# Patient Record
Sex: Female | Born: 1990 | Race: Black or African American | Hispanic: No | Marital: Single | Smoking: Never smoker
Health system: Southern US, Community
[De-identification: ages and names within clinical notes are randomized; demographics above are authoritative.]

## PROBLEM LIST (undated history)

## (undated) DIAGNOSIS — N289 Disorder of kidney and ureter, unspecified: Secondary | ICD-10-CM

## (undated) DIAGNOSIS — Z8659 Personal history of other mental and behavioral disorders: Secondary | ICD-10-CM

## (undated) DIAGNOSIS — F32A Depression, unspecified: Secondary | ICD-10-CM

## (undated) DIAGNOSIS — L309 Dermatitis, unspecified: Secondary | ICD-10-CM

## (undated) DIAGNOSIS — R519 Headache, unspecified: Secondary | ICD-10-CM

## (undated) DIAGNOSIS — E232 Diabetes insipidus: Secondary | ICD-10-CM

## (undated) DIAGNOSIS — I8390 Asymptomatic varicose veins of unspecified lower extremity: Secondary | ICD-10-CM

## (undated) DIAGNOSIS — E039 Hypothyroidism, unspecified: Secondary | ICD-10-CM

## (undated) DIAGNOSIS — J329 Chronic sinusitis, unspecified: Secondary | ICD-10-CM

## (undated) DIAGNOSIS — U071 COVID-19: Secondary | ICD-10-CM

## (undated) DIAGNOSIS — G4730 Sleep apnea, unspecified: Secondary | ICD-10-CM

## (undated) DIAGNOSIS — E739 Lactose intolerance, unspecified: Secondary | ICD-10-CM

## (undated) DIAGNOSIS — Z8639 Personal history of other endocrine, nutritional and metabolic disease: Secondary | ICD-10-CM

## (undated) HISTORY — DX: Asymptomatic varicose veins of unspecified lower extremity: I83.90

## (undated) HISTORY — DX: Personal history of other endocrine, nutritional and metabolic disease: Z86.39

## (undated) HISTORY — DX: Hypothyroidism, unspecified: E03.9

## (undated) HISTORY — DX: Lactose intolerance, unspecified: E73.9

## (undated) HISTORY — DX: Personal history of other mental and behavioral disorders: Z86.59

## (undated) HISTORY — DX: Disorder of kidney and ureter, unspecified: N28.9

## (undated) HISTORY — PX: OTHER SURGICAL HISTORY: SHX169

## (undated) HISTORY — PX: TYMPANOSTOMY TUBE PLACEMENT: SHX32

## (undated) HISTORY — DX: Depression, unspecified: F32.A

---
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
## 2009-05-05 LAB — CONVERTED CEMR LAB: Pap Smear: NORMAL

## 2009-08-05 ENCOUNTER — Ambulatory Visit: Payer: Self-pay | Admitting: Internal Medicine

## 2009-08-05 DIAGNOSIS — D179 Benign lipomatous neoplasm, unspecified: Secondary | ICD-10-CM | POA: Insufficient documentation

## 2009-08-05 DIAGNOSIS — E669 Obesity, unspecified: Secondary | ICD-10-CM | POA: Insufficient documentation

## 2009-08-07 LAB — CONVERTED CEMR LAB
Albumin: 3.6 g/dL (ref 3.5–5.2)
BUN: 11 mg/dL (ref 6–23)
CO2: 27 meq/L (ref 19–32)
Calcium: 10 mg/dL (ref 8.4–10.5)
Chloride: 111 meq/L (ref 96–112)
Eosinophils Absolute: 0.1 10*3/uL (ref 0.0–0.7)
Eosinophils Relative: 2.1 % (ref 0.0–5.0)
Glucose, Bld: 83 mg/dL (ref 70–99)
HDL: 43.1 mg/dL (ref >39.00)
LDL Cholesterol: 104 mg/dL — ABNORMAL HIGH (ref 0–99)
MCHC: 34 g/dL (ref 30.0–36.0)
MCV: 91.6 fL (ref 78.0–100.0)
Monocytes Absolute: 0.7 10*3/uL (ref 0.1–1.0)
Potassium: 4.6 meq/L (ref 3.5–5.1)
TSH: 2.07 microintl units/mL (ref 0.35–5.50)
Total CHOL/HDL Ratio: 4
Triglycerides: 65 mg/dL (ref 0.0–149.0)
VLDL: 13 mg/dL (ref 0.0–40.0)

## 2009-10-05 ENCOUNTER — Ambulatory Visit: Payer: Self-pay | Admitting: Internal Medicine

## 2010-03-22 ENCOUNTER — Ambulatory Visit
Admission: RE | Admit: 2010-03-22 | Discharge: 2010-03-22 | Payer: Self-pay | Source: Home / Self Care | Attending: Internal Medicine | Admitting: Internal Medicine

## 2010-03-22 DIAGNOSIS — K5289 Other specified noninfective gastroenteritis and colitis: Secondary | ICD-10-CM | POA: Insufficient documentation

## 2010-04-06 NOTE — Assessment & Plan Note (Signed)
Summary: not feeling well//ccm   Vital Signs:  Patient profile:   20 year old female Menstrual status:  regular LMP:     09/28/2009 Weight:      227 pounds O2 Sat:      98 % on Room air Temp:     98.2 degrees F oral Pulse rate:   66 / minute BP sitting:   110 / 60  (left arm) Cuff size:   large  Vitals Entered By: Romualdo Bolk, CMA (AAMA) (October 05, 2009 10:16 AM)  O2 Flow:  Room air CC: Coughing, throat feels swollen, sore throat, runny nose, eyes matted shut this started 2 weeks ago, fever 103 temp last week. Stuffy nose, congestion LMP (date): 09/28/2009 LMP - Character: normal Menarche (age onset years): 10-12   Menses interval (days): 28 Menstrual flow (days): 3-4 Enter LMP: 09/28/2009 Last PAP Result normal   History of Present Illness: Tiffany Roth comes in today  for sda  for above .   onset  2 weeks ago  with fever cough and st  fever  103 at onset.   no recent fever.   coughing is worse over past days .  and now coughing up brb and colored phelgm.   esp in am .  no wheezing or fever but feels tired and not well.   face pressure  also.     Using  otc robitussin  .    works in day care.    Preventive Screening-Counseling & Management  Alcohol-Tobacco     Alcohol drinks/day: 0     Smoking Status: never  Caffeine-Diet-Exercise     Caffeine use/day: 1-2     Does Patient Exercise: yes  Current Medications (verified): 1)  None  Allergies (verified): No Known Drug Allergies  Past History:  Past medical, surgical, family and social histories (including risk factors) reviewed, and no changes noted (except as noted below).  Past Medical History: Reviewed history from 08/05/2009 and no changes required. 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  Varicose veins  LE    neg obstruction 3 months premature     "6 month 1# 15 oz"  Ventilator  Hx bulimia in remission after  counseling  Past Surgical History: Reviewed history from 08/05/2009 and no changes required. Denies surgical history  Past History:  Care Management: Dermatology: [facility]cology: [facility]  Family History: Reviewed history from 08/05/2009 and no changes required. Father: Healthy  Mother: Healthy Siblings: 1/2 Sister- Healthy  neg for Dm   Social History: Reviewed history from 08/05/2009 and no changes required. 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    Review of Systems       The patient complains of anorexia and hoarseness.  The patient denies decreased hearing, chest pain, syncope, dyspnea on exertion, abdominal pain, unusual weight change, enlarged lymph nodes, and angioedema.         fever at onset  no  cehst pain and no nose bleeds  cough hard at times  to vomiting  Physical Exam  General:  well-developed, well-nourished, and well-hydrated.  congested and tired and hoarse   non toxic  Head:  Normocephalic and atraumatic without obvious abnormalities. No apparent alopecia or balding. Eyes:  clear  Ears:  R ear normal  and L ear normal.   Nose:  no external deformity and no external erythema.  copious mucoid discharge  face tendern masillary area  Mouth:  pharynx pink and moist.  mild erythema no edema Neck:  shoddy nodes  Lungs:  Normal respiratory effort, chest expands symmetrically. Lungs are clear to auscultation, no crackles or wheezes.no dullness.   Heart:  Normal rate and regular rhythm. S1 and S2 normal without gallop, murmur, click, rub or other extra sounds. Pulses:  nl cap refill  Skin:  turgor normal, color normal, no ecchymoses, and no petechiae.   Cervical Nodes:  shoddy nodes  Psych:  Oriented X3.  normal for situation   Impression & Recommendations:  Problem # 1:  SINUSITIS - ACUTE-NOS (ICD-461.9) maxillary  sinusitis  Her  updated medication list for this problem includes:    Hydromet 5-1.5 Mg/19ml Syrp (Hydrocodone-homatropine) .Marland Kitchen... 1-2 tsp q4-6 hours as needed cough  Problem # 2:  COUGH (ICD-786.2) infectious     Orders: T-2 View CXR (71020TC)  Problem # 3:  HEMOPTYSIS UNSPECIFIED (ICD-786.30) prob from infection     ... check chest x ray   Complete Medication List: 1)  Hydromet 5-1.5 Mg/12ml Syrp (Hydrocodone-homatropine) .Marland Kitchen.. 1-2 tsp q4-6 hours as needed cough 2)  Augmentin 875-125 Mg Tabs (Amoxicillin-pot clavulanate) .Marland Kitchen.. 1 by mouth two times a day  Patient Instructions: 1)  You have a sinus infection   2)  get a chest x ray today  to make sure you dont have pneumionia  and will   order an appropriate antibioitc   today 3)  expect improvement within 3-5 days on antibioitc  . 4)  call if not getting better . as such.  Prescriptions: HYDROMET 5-1.5 MG/5ML SYRP (HYDROCODONE-HOMATROPINE) 1-2 tsp q4-6 hours as needed cough  #6 oz x 0   Entered and Authorized by:   Madelin Headings MD   Signed by:   Madelin Headings MD on 10/05/2009   Method used:   Print then Give to Patient   RxID:   [id number]  note for work  out  until aug 3 or when better   wkp.

## 2010-04-06 NOTE — Assessment & Plan Note (Signed)
Summary: form completion.ok per doc/njr   Vital Signs:  Patient profile:   20 year old female Menstrual status:  regular LMP:     07/05/2009 Height:      61.5 inches Weight:      228 pounds BMI:     42.54 Pulse rate:   72 / minute BP sitting:   120 / 80  (right arm) Cuff size:   regular  Vitals Entered By: Romualdo Bolk, CMA (AAMA) (August 05, 2009 9:24 AM)  Nutrition Counseling: Patient's BMI is greater than 25 and therefore counseled on weight management options. CC: Form Completion LMP (date): 07/05/2009 LMP - Character: normal Menarche (age onset years): 10-12   Menses interval (days): 28 Menstrual flow (days): 3-4 Menstrual Status regular Enter LMP: 07/05/2009 Last PAP Result normal   History of Present Illness: Tiffany Roth comesin today for " form completion for job.  basically a check up .  There is no paper record available and  new patient forms reviewed.  Her last visit /check up here was  2008 3 years ago.     Marland Kitchen She was in school since  that time . She is to graduate  eventually in elem education. . She has been seen at [facility] for routine  problems had gyne check. 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   Already had PPD.  Ros neg ortho Cv pulm  . Varicose veins about the same. Wants to lose weight .  mood eats at times.    Preventive Care Screening  Pap Smear:    Date:  05/05/2009    Results:  normal   Prior Values:    Last Tetanus Booster:  Tdap [facility]) (10/03/2006)   Preventive Screening-Counseling & Management  Alcohol-Tobacco     Alcohol drinks/day: 0     Smoking Status: never  Caffeine-Diet-Exercise     Caffeine use/day: 1-2     Does Patient Exercise: yes      Drug Use:  no.    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   Smoking Status:  never Caffeine use/day:  1-2 Does Patient Exercise:  yes Drug Use:  no  Review of Systems  The patient denies anorexia, fever, weight loss, chest pain, syncope, dyspnea on exertion, prolonged cough, difficulty walking, depression, abnormal bleeding, enlarged lymph nodes, and angioedema.         neg vision hearing pulm cv problems ocass minor back pain and  .      contact and glasses.  Physical Exam General Appearance: well developed, well nourished, no acute distress Eyes: conjunctiva and lids normal, PERRLA, EOMI,  WNL Ears, Nose, Mouth, Throat: TM clear, nares clear, oral exam WNL Neck: supple, no lymphadenopathy, no thyromegaly, no JVD Respiratory: clear to auscultation and percussion, respiratory effort normal Cardiovascular: regular rate and rhythm, S1-S2, no murmur, rub or gallop, no bruits, peripheral pulses normal and symmetric, no cyanosis, clubbing, edema  multiple varicose veins le without ulceration  Chest: no scars, masses, tenderness; no asymmetry, skin changes, nipple discharge   Gastrointestinal: soft, non-tender; no hepatosplenomegaly, masses; active bowel sounds all quadrants,  small almond sized nodule left abdomen  no redness or fluctuance  Genitourinary:   done at [facility] Lymphatic: no cervical, axillary or inguinal adenopathy Musculoskeletal: gait normal,  muscle tone and strength WNL, no joint swelling, effusions, discoloration, crepitus  Skin: clear, good turgor, color WNL, no rashes, lesions, or ulcerations Neurologic: normal mental status, normal reflexes, normal strength, sensation, and motion Psychiatric: alert; oriented to person, place and time Other Exam:  paper record obtained and reviewed and loaded    Impression & Recommendations:  Problem # 1:  PREVENTIVE HEALTH CARE (ICD-V70.0)  Discussed nutrition,exercise,diet,healthy weight, vitamin D and calcium.   form signed       no limitations   had  neg ppd in past .  Orders: Venipuncture (16109) TLB-Lipid Panel (80061-LIPID) TLB-BMP (Basic Metabolic Panel-BMET) (80048-METABOL) TLB-CBC Platelet - w/Differential (85025-CBCD) TLB-Hepatic/Liver Function Pnl (80076-HEPATIC) TLB-TSH (Thyroid Stimulating Hormone) (84443-TSH) TLB-T4 (Thyrox), Free [phone])  Problem # 2:  HYPOTHYROIDISM (ICD-244.9)  hx of   as child  unsure when replacement stopped      .   Apparently doing ok  but should be rechecked .  Old  Paper record not available  today    Labs Reviewed: TSH: 1.75 (08/18/2006)    Chol: 197 (08/18/2006)   HDL: 37.4 (08/18/2006)     Problem # 3:  VARICOSE VEIN (ICD-456.8) bilateral    no change  or ulcerations  .   has been evaluated in remote past.    consider reeval if progressive   Problem # 4:  LIPOMA (ICD-214.9) check as needed getting larger  Problem # 5:  OBESITY (ICD-278.00) counseled   Patient Instructions: 1)  Get sleep   2)  sleep deprivation is related to weight gain and health issues. 3)  COnsider weight watcher  on line or other. 4)  do 2 weeks calendar of intake and sleep   to help change habits . 5)  no calories  in beverages . 6)  dont eat out for a month.

## 2010-04-08 NOTE — Assessment & Plan Note (Signed)
Summary: cramps//ccm   Vital Signs:  Patient profile:   20 year old female Menstrual status:  regular LMP:     02/28/2010 Weight:      228 pounds Temp:     97.9 degrees F oral Pulse rate:   78 / minute BP sitting:   110 / 80  (left arm) Cuff size:   large  Vitals Entered By: Romualdo Bolk, CMA (AAMA) (March 22, 2010 11:38 AM) CC: Started at 3am with severe pain in stomach. Pt is having vomiting and watery diarrhea. No traveling or ate anything abnormal. LMP (date): 02/28/2010 LMP - Character: normal Menarche (age onset years): 10-12   Menses interval (days): 28 Menstrual flow (days): 3-4 Enter LMP: 02/28/2010 Last PAP Result normal   History of Present Illness: Tiffany Roth comes in today  for acute onset of vomiting   watery diarrhea last pm with abd cramps and pain.   mostly on sides but upper middle and now on left.   recurrent frequent diarrhea  no blood and no fever.  No meds taken. last vomit early am.   No one else sick and no hx of same. no recent antibiotics cough could or uti signs .   Preventive Screening-Counseling & Management  Alcohol-Tobacco     Alcohol drinks/day: 0     Smoking Status: never  Caffeine-Diet-Exercise     Caffeine use/day: 1-2     Does Patient Exercise: yes  Current Medications (verified): 1)  Multivitamins   Tabs (Multiple Vitamin)  Allergies (verified): No Known Drug Allergies  Past History:  Past medical, surgical, family and social histories (including risk factors) reviewed, and no changes noted (except as noted below).  Past Medical History: Reviewed history from 08/05/2009 and no changes required. 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  Varicose veins  LE    neg obstruction 3 months premature     "6 month 1# 15 oz"  Ventilator  Hx bulimia in remission after counseling  Past Surgical History: Reviewed history from 08/05/2009 and  no changes required. Denies surgical history  Past History:  Care Management: Dermatology: [facility]cology: [facility]  Family History: Reviewed history from 08/05/2009 and no changes required. Father: Healthy  Mother: Healthy Siblings: 1/2 Sister- Healthy  neg for Dm   Social History: Reviewed history from 10/05/2009 and no changes required. 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    Review of Systems       The patient complains of anorexia.  The patient denies fever, weight loss, weight gain, vision loss, decreased hearing, chest pain, syncope, dyspnea on exertion, peripheral edema, prolonged cough, headaches, abdominal pain, melena, hematochezia, severe indigestion/heartburn, hematuria, incontinence, genital sores, abnormal bleeding, enlarged lymph nodes, and angioedema.    Physical Exam  General:  alert, well-developed, well-nourished, and well-hydrated.  mildy ill and washed out  nl cognition Head:  normocephalic and atraumatic.   Eyes:  clear Ears:  R ear normal and L ear normal.   Nose:  no external deformity.   Mouth:  pharynx pink and moist.  slightly dry lips Neck:  shoddy nodes  Lungs:  Normal respiratory effort, chest expands symmetrically. Lungs are clear to auscultation, no crackles or wheezes.no dullness.   Heart:  Normal rate and regular rhythm. S1 and S2 normal without gallop, murmur, click, rub  or other extra sounds. Abdomen:  soft, no distention, no masses, no guarding, no rigidity, no rebound tenderness, no hepatomegaly, and no splenomegaly.  slightly decrease bs  tender mid upper to luq area  no guarding  Pulses:  nl cap refill  Extremities:  varicosities minimal edema Neurologic:  grossly non focal  alert  Skin:  turgor normal, color normal, no ecchymoses, and no petechiae.   Cervical Nodes:  No lymphadenopathy noted Psych:  Oriented X3, normally interactive,  good eye contact, not anxious appearing, and not depressed appearing.     Impression & Recommendations:  Problem # 1:  GASTROENTERITIS, ACUTE (ICD-558.9) hydration seems adequate  Expectant management and  hydration   .   no school today but call if needs note for school. Spportive care  important.   Complete Medication List: 1)  Multivitamins Tabs (Multiple vitamin) 2)  Promethazine Hcl 25 Mg Supp (Promethazine hcl) .Marland Kitchen.. 1 po q4-6 hours  as needed  for nausea vomiting  Patient Instructions: 1)  clear liquid until vomiting gone such as gatorade and soup broth. 2)  then can advance to light diet avoiding fruits  and heavy foods.  3)  can try meds for nausea and vomiting . 4)  immodium could temporarily slow down diarrhea after the first day or so.  5)  call if fever severe pain dehydration   issues.  or if diarrhea not gone in 7-10days or so  Prescriptions: PROMETHAZINE HCL 25 MG SUPP (PROMETHAZINE HCL) 1 po q4-6 hours  as needed  for nausea vomiting  #12 x 0   Entered and Authorized by:   Madelin Headings MD   Signed by:   Madelin Headings MD on 03/22/2010   Method used:   Electronically to        [facility] [phone]* (retail)       [address]       [place], [place]  [address]       Ph: [phone] or [phone]       Fax: [phone]   RxID:   [id number]    Orders Added: 1)  Est. Patient Level IV [34742]   meant to send in  tabs  instead of suppositories ... spoke with mom  she is doing ok and hasnt needed to use med yet  .  tolerating gatorade . She took her back ot school [employer])  Madelin Headings MD  March 22, 2010 7:56 PM

## 2010-05-18 ENCOUNTER — Ambulatory Visit (INDEPENDENT_AMBULATORY_CARE_PROVIDER_SITE_OTHER): Payer: BC Managed Care – PPO | Admitting: Internal Medicine

## 2010-05-18 ENCOUNTER — Encounter: Payer: Self-pay | Admitting: Internal Medicine

## 2010-05-18 VITALS — BP 120/80 | HR 104 | Temp 97.9°F | Wt 225.0 lb

## 2010-05-18 DIAGNOSIS — J069 Acute upper respiratory infection, unspecified: Secondary | ICD-10-CM

## 2010-05-18 MED ORDER — METRONIDAZOLE 500 MG PO TABS: 500.0000 mg | ORAL_TABLET | Freq: Three times a day (TID) | ORAL | Status: AC

## 2010-05-18 NOTE — Patient Instructions (Signed)
This is a viral URI  With sinus involvement.  This should resolve in another 7-10 days . Take decongestant  Such as plain  sudafed or mucinex d and saline nose spray for  Moisturizing.     Nose spray     Cough may get worse before gets better.  If getting increasing sinus pain despite above or continuing without improvement . Call for advise . Sometime antibiotic treatment will help at that point.

## 2010-05-22 ENCOUNTER — Encounter: Payer: Self-pay | Admitting: Internal Medicine

## 2010-05-22 NOTE — Assessment & Plan Note (Signed)
No meds now 

## 2010-05-22 NOTE — Progress Notes (Signed)
  Subjective:    Patient ID: Tiffany Roth, female    DOB: 02-14-1991, 20 y.o.   MRN: [id number]  HPI Patient comes in today for acute visit. She had onset  about a week or so  of a cough and a runny nose. No specific treatment some fever at the onset but not currently she denies chest pain or shortness of breath however she is having continued symptoms and significant head clogging. Question if she has wheezing no history ofasthma   Review of Systems No chest pain shortness of breath ears are somewhat clogged nose rib pain nausea vomiting diarrhea or unusual rashes. 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    Objective:   Physical Exam Well-developed well-nourished and no acute distress but extremely congested. HEENT: Normocephalic ;atraumatic , Eyes;  PERRL, EOMs  Full, lids and conjunctiva clear,,Ears: no deformities, canals nl, TM landmarks normal, Nose: no deformity or Thick mucoid discharge face  nontender Mouth : OP clear without lesion or edema . Neck without masses or thyromegaly Chest:  Clear to A&P without wheezes rales or rhonchi CV:  S1-S2 no gallops or murmurs peripheral perfusion is normal No clubbing cyanosis      Assessment & Plan:  Acute upper respiratory infection probably viral   Expectant management.   And advice given for treatment as well as alarm features.  If not improving consider treatment and evaluation for sinusitis. 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

## 2010-07-16 ENCOUNTER — Ambulatory Visit (INDEPENDENT_AMBULATORY_CARE_PROVIDER_SITE_OTHER): Payer: BC Managed Care – PPO | Admitting: Internal Medicine

## 2010-07-16 ENCOUNTER — Encounter: Payer: Self-pay | Admitting: Internal Medicine

## 2010-07-16 VITALS — BP 120/80 | HR 66 | Temp 98.3°F | Wt 223.0 lb

## 2010-07-16 DIAGNOSIS — L259 Unspecified contact dermatitis, unspecified cause: Secondary | ICD-10-CM

## 2010-07-16 MED ORDER — CEPHALEXIN 500 MG PO CAPS: 500.0000 mg | ORAL_CAPSULE | Freq: Two times a day (BID) | ORAL | Status: AC

## 2010-07-16 MED ORDER — TRIAMCINOLONE ACETONIDE 0.1 % EX OINT: TOPICAL_OINTMENT | CUTANEOUS | Status: DC

## 2010-07-16 MED ORDER — PREDNISONE 10 MG PO TABS: ORAL_TABLET | ORAL | Status: DC

## 2010-07-16 NOTE — Patient Instructions (Addendum)
Take the cortsone and antibiotic  For now and then topical  Moisturizing  Call with   appt  For derm so we can send our note.

## 2010-07-16 NOTE — Assessment & Plan Note (Signed)
Currently quite extensive with lichenification.  Recommend systemic steroid with topicals and cover for superinfection. Advised patient that systemic cortisone would not be a good long-term recurrent treatment to use for control. Agree with referral mom will call about  where the appointment will be.

## 2010-07-16 NOTE — Progress Notes (Signed)
  Subjective:    Patient ID: Tiffany Roth, female    DOB: [birth year], 20 y.o.   MRN: [id number]  HPI Patient comes in today for an acute patient visit with her mom for the above problem. She was having itching and skin rashes in the end of March but she was away at school. She was referred to [facility] dermatology and was seen by a resident and given hydroxyzine and some type of topical cream. However her itching and rash continued since that she comes in today with rash on her back arms left hand size that is very itchy and not getting better.  She has a history of eczema having been treated by dermatology before has had required some prednisone in the remote past and has used Eucerin and triamcinolone.   She has no fever no excess weeping except on the left hand. Her previous local dermatologist Dr. Doristine Section has retired. She has seen cream per dermatology in the past also.   Review of Systems Negative respiratory symptoms fever or joint pains. Tends to use showers versus  baths rest of ROS noncontributory Past history family history social history reviewed in the electronic medical record.     Objective:   Physical Exam Well-developed well-nourished in no acute distress. Face is clear. Neck supple without adenopathy Skin:  Extensive dryness lichenification left arm back hand more than right( I believe patient is right-handed) also bilateral thighs proximally some papular areas slight crusting no vesicles or pustules. Left lateral hand and fingers with thickened eczema areas. No edema or angioedema.       Assessment & Plan:  Extensive eczema consider secondary infection  Uncontrolled.  Discussed avoiding using prednisone rescue for treatment of her eczema. However today it is so extensive I suggest we do this in the past and reviewed moisturizing skin hydration avoiding scratching and we'll try to give up with or ointment with [facility] ointment as followup. We'll treat for secondary  infection. I agree with her seeing a dermatologist. She will be in [place] this summer. Mom will set her up with an appointment and they will call we can send copies of our note. We believe she is seeing Dr. Donzetta Starch in the past.

## 2010-08-27 ENCOUNTER — Emergency Department (HOSPITAL_COMMUNITY): Payer: BC Managed Care – PPO

## 2010-08-27 ENCOUNTER — Emergency Department (HOSPITAL_COMMUNITY)
Admission: EM | Admit: 2010-08-27 | Discharge: 2010-08-27 | Disposition: A | Discharge status: Home / Self Care | Payer: BC Managed Care – PPO | Attending: Emergency Medicine | Admitting: Emergency Medicine

## 2010-08-27 ENCOUNTER — Encounter (HOSPITAL_COMMUNITY): Payer: Self-pay | Admitting: Radiology

## 2010-08-27 DIAGNOSIS — R10819 Abdominal tenderness, unspecified site: Secondary | ICD-10-CM | POA: Insufficient documentation

## 2010-08-27 DIAGNOSIS — R197 Diarrhea, unspecified: Secondary | ICD-10-CM | POA: Insufficient documentation

## 2010-08-27 DIAGNOSIS — R109 Unspecified abdominal pain: Secondary | ICD-10-CM | POA: Insufficient documentation

## 2010-08-27 DIAGNOSIS — K828 Other specified diseases of gallbladder: Secondary | ICD-10-CM | POA: Insufficient documentation

## 2010-08-27 LAB — COMPREHENSIVE METABOLIC PANEL
AST: 59 U/L — ABNORMAL HIGH (ref 0–37)
CO2: 21 mEq/L (ref 19–32)
Calcium: 9.8 mg/dL (ref 8.4–10.5)
Creatinine, Ser: 1.13 mg/dL — ABNORMAL HIGH (ref 0.50–1.10)
GFR calc Af Amer: >60 mL/min (ref >60)
GFR calc non Af Amer: >60 mL/min (ref >60)
Sodium: 140 mEq/L (ref 135–145)
Total Protein: 7.3 g/dL (ref 6.0–8.3)

## 2010-08-27 LAB — DIFFERENTIAL
Basophils Absolute: 0 10*3/uL (ref 0.0–0.1)
Basophils Relative: 0 % (ref 0–1)
Eosinophils Absolute: 0.1 10*3/uL (ref 0.0–0.7)
Eosinophils Relative: 2 % (ref 0–5)
Lymphocytes Relative: 38 % (ref 12–46)
Monocytes Absolute: 0.6 10*3/uL (ref 0.1–1.0)

## 2010-08-27 LAB — POCT PREGNANCY, URINE: Preg Test, Ur: NEGATIVE

## 2010-08-27 LAB — URINALYSIS, ROUTINE W REFLEX MICROSCOPIC
Hgb urine dipstick: NEGATIVE
Nitrite: NEGATIVE
Protein, ur: NEGATIVE mg/dL
Specific Gravity, Urine: 1.007 (ref 1.005–1.030)
Urobilinogen, UA: 0.2 mg/dL (ref 0.0–1.0)

## 2010-08-27 LAB — CBC
HCT: 43.8 % (ref 36.0–46.0)
MCHC: 33.3 g/dL (ref 30.0–36.0)
Platelets: 287 10*3/uL (ref 150–400)
RDW: 12.6 % (ref 11.5–15.5)
WBC: 6.1 10*3/uL (ref 4.0–10.5)

## 2010-08-27 MED ORDER — TECHNETIUM TC 99M MEBROFENIN IV KIT
5.5000 | PACK | Freq: Once | INTRAVENOUS | Status: AC | PRN
  Administered 2010-08-27: 5.5 via INTRAVENOUS

## 2010-08-27 MED ORDER — SINCALIDE 5 MCG IJ SOLR: 0.0200 ug/kg | Freq: Once | INTRAMUSCULAR | Status: DC

## 2010-10-04 ENCOUNTER — Ambulatory Visit (INDEPENDENT_AMBULATORY_CARE_PROVIDER_SITE_OTHER): Payer: PRIVATE HEALTH INSURANCE | Admitting: Internal Medicine

## 2010-10-04 ENCOUNTER — Encounter: Payer: Self-pay | Admitting: Internal Medicine

## 2010-10-04 VITALS — BP 120/80 | HR 88 | Temp 98.0°F | Wt 230.0 lb

## 2010-10-04 DIAGNOSIS — J019 Acute sinusitis, unspecified: Secondary | ICD-10-CM

## 2010-10-04 DIAGNOSIS — E669 Obesity, unspecified: Secondary | ICD-10-CM

## 2010-10-04 MED ORDER — AMOXICILLIN-POT CLAVULANATE 875-125 MG PO TABS: 1.0000 | ORAL_TABLET | Freq: Two times a day (BID) | ORAL | Status: AC

## 2010-10-04 NOTE — Patient Instructions (Signed)
This acts like a bacterial sinus infection. Add antibiotic  And decongestant as long as you have  Nasal stuffiness.  Can do nutrition referral.  Limit eating  Out  And monitoring. Marland Kitchen

## 2010-10-04 NOTE — Progress Notes (Signed)
  Subjective:    Patient ID: Tiffany Roth, female    DOB: 01-14-1991, 20 y.o.   MRN: [id number]  HPI Comes in for sda  Because of  prolonged UR sx and not getting better . nasal congestion without fever  And now has  Few strands  f blood in phlegm.    No fever   Began like a summer cold  Still sneezes at times.   Tried no meds.     NO HAs.  Has been going on for 2 weeks or more. Not taking any other meds  Back to school in 2 weeks     Review of Systems Neg fever cp sob  Chills   Trying to lose weight on weight watcher s but eats out a lot of fast food . Having a hard timte losing weight.  Past history family history social history reviewed in the electronic medical record.     Objective:   Physical Exam wdwn in nand  Very congested  HEENT: Normocephalic ;atraumatic , Eyes;  PERRL, EOMs  Full, lids and conjunctiva clear,,Ears: no deformities, canals nl, TM landmarks normal, Nose: no deformity very congested with mucoid dc Mouth : OP clear without lesion or edema . Neck no masses  Or adenopathy . Chest:  Clear to A&P without wheezes rales or rhonchi CV:  S1-S2 no gallops or murmurs peripheral perfusion is normal Skin no acute rash    Assessment & Plan:  Complicated uri... sinusitis  Over 2 weeks and getting worse.   Obesity  Disc eating out and cont w watcher track with app may help ok to do nutrition referral.   Total visit > 50% spent counseling and coordinating care

## 2011-10-12 ENCOUNTER — Other Ambulatory Visit: Payer: Self-pay | Admitting: Internal Medicine

## 2011-10-12 ENCOUNTER — Ambulatory Visit (INDEPENDENT_AMBULATORY_CARE_PROVIDER_SITE_OTHER): Payer: PRIVATE HEALTH INSURANCE | Admitting: Internal Medicine

## 2011-10-12 ENCOUNTER — Encounter: Payer: Self-pay | Admitting: Internal Medicine

## 2011-10-12 VITALS — HR 129 | Temp 98.7°F | Wt 224.0 lb

## 2011-10-12 DIAGNOSIS — L239 Allergic contact dermatitis, unspecified cause: Secondary | ICD-10-CM

## 2011-10-12 DIAGNOSIS — L259 Unspecified contact dermatitis, unspecified cause: Secondary | ICD-10-CM

## 2011-10-12 DIAGNOSIS — L309 Dermatitis, unspecified: Secondary | ICD-10-CM

## 2011-10-12 MED ORDER — PREDNISONE 10 MG PO TABS: ORAL_TABLET | ORAL | Status: DC

## 2011-10-12 MED ORDER — CEPHALEXIN 500 MG PO CAPS: 500.0000 mg | ORAL_CAPSULE | Freq: Three times a day (TID) | ORAL | Status: DC

## 2011-10-12 MED ORDER — HYDROXYZINE HCL 25 MG PO TABS: 25.0000 mg | ORAL_TABLET | Freq: Three times a day (TID) | ORAL | Status: DC | PRN

## 2011-10-12 NOTE — Progress Notes (Signed)
  Subjective:    Patient ID: Tiffany Roth, female    DOB: 1990-11-10, 21 y.o.   MRN: [id number]  HPI Patient comes in today for SDA for  new problem evaluation. HERE With father onset about a week ago of increasing eczema.  And itching all over   No rx except aquaphor. 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  No fever chills nvd some weeping back of neck  No sore throat  Cps ob wheezing. Review of Systems As per hpi no joint pains   No cold sores but lips are dry and  iritated Past history family history social history reviewed in the electronic medical record.    Objective:   Physical Exam Pulse 129  Temp 98.7 F (37.1 C) (Oral)  Wt 224 lb (101.606 kg)  LMP 10/03/2011 WDWN in nad rash extensive all over esp face with papules foreahead and temporal and allerigic around eyes  But conjunct is clear . tms nl OP no edema patent airway. Neck: Supple without adenopathy or masses or bruits Chest:  Clear to A&P without wheezes rales or rhonchi CV:  S1-S2 no gallops or murmurs peripheral perfusion is normal Abdomen:  Sof,t normal bowel sounds without hepatosplenomegaly, no guarding rebound or masses no CVA tenderness SKIN  Extensive eczematous patches on arms trunk face and neck.    Papule pustules on the face but no vesicles. Excoriated areas thickened areas on hands ulnar side dorsal. Palms appear pretty clear. Lips no cracking or weeping and no vesicles      Assessment & Plan:  Extensive acute eczematous rash Concern about secondary infection because it's so extensive swab culture of neck area done (was then systemically 12 day taper and Keflex 500 3 times a day. No obvious herpetic areas  Local care hydroxyzine cetirizine for itching scratching cool compresses follow up of socks significantly better with this treatment she may need to see a dermatologist.  She has a new job as a Runner, broadcasting/film/video and doesn't get out total after 5  sutures some concern about getting to appointments but if not getting better we'll do referral. Instead of coming in in 2 weeks she can call instead.

## 2011-10-12 NOTE — Patient Instructions (Addendum)
This is a severe allergic reaction eczema like and possible secondary infection.  Take prednisone right away.   And add antibiotic for infection.  atarax for itching  At night  Zyrtec for the day  As tolerated .  Expect impovement in the next 48 hours  Contact us if fever and if not getting better  We should get you to see dermatology.  Will contact ou when culture back

## 2011-10-15 LAB — WOUND CULTURE: Gram Stain: NONE SEEN

## 2011-11-23 ENCOUNTER — Ambulatory Visit: Payer: PRIVATE HEALTH INSURANCE | Admitting: Internal Medicine

## 2011-11-25 ENCOUNTER — Encounter: Payer: Self-pay | Admitting: Internal Medicine

## 2011-11-25 ENCOUNTER — Ambulatory Visit (INDEPENDENT_AMBULATORY_CARE_PROVIDER_SITE_OTHER): Payer: BC Managed Care – PPO | Admitting: Internal Medicine

## 2011-11-25 VITALS — BP 120/84 | HR 84 | Temp 98.9°F | Wt 236.0 lb

## 2011-11-25 DIAGNOSIS — R5381 Other malaise: Secondary | ICD-10-CM

## 2011-11-25 DIAGNOSIS — R5383 Other fatigue: Secondary | ICD-10-CM

## 2011-11-25 DIAGNOSIS — Z13220 Encounter for screening for lipoid disorders: Secondary | ICD-10-CM

## 2011-11-25 DIAGNOSIS — R635 Abnormal weight gain: Secondary | ICD-10-CM | POA: Insufficient documentation

## 2011-11-25 DIAGNOSIS — E039 Hypothyroidism, unspecified: Secondary | ICD-10-CM

## 2011-11-25 DIAGNOSIS — L259 Unspecified contact dermatitis, unspecified cause: Secondary | ICD-10-CM

## 2011-11-25 DIAGNOSIS — E669 Obesity, unspecified: Secondary | ICD-10-CM

## 2011-11-25 DIAGNOSIS — Z299 Encounter for prophylactic measures, unspecified: Secondary | ICD-10-CM

## 2011-11-25 LAB — CBC WITH DIFFERENTIAL/PLATELET
Basophils Absolute: 0.1 10*3/uL (ref 0.0–0.1)
Basophils Relative: 1 % (ref 0–1)
MCHC: 32.9 g/dL (ref 30.0–36.0)
Neutro Abs: 3.8 10*3/uL (ref 1.7–7.7)
Neutrophils Relative %: 45 % (ref 43–77)
Platelets: 296 10*3/uL (ref 150–400)
RDW: 12.6 % (ref 11.5–15.5)
WBC: 8.4 10*3/uL (ref 4.0–10.5)

## 2011-11-25 MED ORDER — PHENTERMINE HCL 15 MG PO CAPS: 15.0000 mg | ORAL_CAPSULE | ORAL | Status: DC

## 2011-11-25 NOTE — Addendum Note (Signed)
Addended by: Bonnye Fava on: 11/25/2011 04:53 PM   Modules accepted: Orders

## 2011-11-25 NOTE — Patient Instructions (Addendum)
Continue Weight Watchers Medications are usually only mildly helpful indicated for only 3 months.  Have not seen people with great success with medications over time however we can try this and have you come back in a month. Began with phentermine 15 mg in the morning we may increase dosing. Make sure your continuing getting adequate sleep. Laboratory tests today to check for thyroid and anemia. Get the flu shot and PPD reading on Monday.

## 2011-11-25 NOTE — Progress Notes (Signed)
  Subjective:    Patient ID: Tiffany Roth, female    DOB: 19-Aug-1990, 21 y.o.   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 He also is here for followup of her eczema that was secondarily infected with staph. She's much better in this regard just has some patches on her hands she is using Aquaphor.  She's also having concerns about her weight she is doing weight watchers but doesn't have time to go to meetings and not as much time for exercise because of her full day. She is getting adequate sleep and asks about help with medication. Denies specific depression at this time. She doesn't skip meals and doesn't eat out. Review of Systems Negative for chest pain shortness of breath vision hearing problems varicose veins in the legs as above. 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 Her periods are fairly regular no risk of pregnancy.  Past history family history social history reviewed in the electronic medical record.     Objective:   Physical Exam BP 120/84  Pulse 84  Temp 98.9 F (37.2 C) (Oral)  Wt 236 lb (107.049 kg)  SpO2 96%  LMP 11/14/2011 Well-developed well-nourished in no acute distress HEENT normocephalic TMs clear there is some scarring on her right TM eyes clear nares patent A&P tongue midline neck supple without masses or adenopathy chest clear to auscultation cardiac S1-S2 gallops murmurs abdomen soft without megaly guarding or rebound extremities no significant edema significant varicose veins noted. No ulcers or lesions. Skin much improved has some patches fading eczema on the lower portion of hands and palms are a little bit on face. No pustules or infection noted Musculoskeletal no acute changes  Neurologic oriented x3 cranial nerves III through XII appear intact no acute changes  noted.    Assessment & Plan:   Eczema with secondary staph infection significantly improved discussion management hydration steroids on areas of flair or and check back with [place] immediately if any signs of infection.  Preventive PPD today she will get a flu shot when she comes back for the reading. Form will be signed for school with no limitations.  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 Counseling. Discussed poor track record of medications and potential side effects indicated only for 3 months however can give a trial at the low dose of 15 mg plus continuing her weight watching encourage sleep and followup in a month increase dosing as tolerated. Warning about how can affect sleep.

## 2011-11-26 LAB — HEPATIC FUNCTION PANEL
ALT: 19 U/L (ref 0–35)
AST: 24 U/L (ref 0–37)
Alkaline Phosphatase: 105 U/L (ref 39–117)
Indirect Bilirubin: 0.4 mg/dL (ref 0.0–0.9)
Total Protein: 6.9 g/dL (ref 6.0–8.3)

## 2011-11-26 LAB — LIPID PANEL
Cholesterol: 159 mg/dL (ref 0–200)
LDL Cholesterol: 87 mg/dL (ref 0–99)
Triglycerides: 190 mg/dL — ABNORMAL HIGH (ref <150)

## 2011-11-26 LAB — BASIC METABOLIC PANEL
CO2: 26 mEq/L (ref 19–32)
Calcium: 10 mg/dL (ref 8.4–10.5)
Creat: 1.25 mg/dL — ABNORMAL HIGH (ref 0.50–1.10)
Glucose, Bld: 75 mg/dL (ref 70–99)

## 2011-11-28 ENCOUNTER — Ambulatory Visit: Payer: PRIVATE HEALTH INSURANCE | Admitting: Internal Medicine

## 2011-12-07 ENCOUNTER — Other Ambulatory Visit: Payer: Self-pay | Admitting: Family Medicine

## 2011-12-07 DIAGNOSIS — R7989 Other specified abnormal findings of blood chemistry: Secondary | ICD-10-CM

## 2011-12-09 ENCOUNTER — Encounter: Payer: Self-pay | Admitting: *Deleted

## 2011-12-09 ENCOUNTER — Telehealth: Payer: Self-pay | Admitting: Internal Medicine

## 2011-12-09 ENCOUNTER — Other Ambulatory Visit (INDEPENDENT_AMBULATORY_CARE_PROVIDER_SITE_OTHER): Payer: BC Managed Care – PPO

## 2011-12-09 DIAGNOSIS — R799 Abnormal finding of blood chemistry, unspecified: Secondary | ICD-10-CM

## 2011-12-09 DIAGNOSIS — R7989 Other specified abnormal findings of blood chemistry: Secondary | ICD-10-CM

## 2011-12-09 NOTE — Telephone Encounter (Signed)
Completed by Fleet Contras with the pt in the office.

## 2011-12-09 NOTE — Addendum Note (Signed)
Addended by: Bonnye Fava on: 12/09/2011 04:44 PM   Modules accepted: Orders

## 2011-12-09 NOTE — Telephone Encounter (Signed)
Pt called and said that she is coming in for lab today at 4pm and is req to get a work excuse note for ov Pls fax to pts work asap today. The Fax # is [phone].

## 2011-12-10 LAB — BASIC METABOLIC PANEL
CO2: 28 mEq/L (ref 19–32)
Calcium: 9.5 mg/dL (ref 8.4–10.5)
Glucose, Bld: 76 mg/dL (ref 70–99)
Sodium: 143 mEq/L (ref 135–145)

## 2011-12-19 ENCOUNTER — Encounter: Payer: Self-pay | Admitting: Internal Medicine

## 2011-12-19 ENCOUNTER — Ambulatory Visit (INDEPENDENT_AMBULATORY_CARE_PROVIDER_SITE_OTHER): Payer: BC Managed Care – PPO | Admitting: Internal Medicine

## 2011-12-19 VITALS — BP 128/70 | HR 80 | Temp 98.5°F | Wt 233.0 lb

## 2011-12-19 DIAGNOSIS — F4320 Adjustment disorder, unspecified: Secondary | ICD-10-CM

## 2011-12-19 DIAGNOSIS — Z79899 Other long term (current) drug therapy: Secondary | ICD-10-CM

## 2011-12-19 DIAGNOSIS — E669 Obesity, unspecified: Secondary | ICD-10-CM

## 2011-12-19 DIAGNOSIS — Z6841 Body Mass Index (BMI) 40.0 and over, adult: Secondary | ICD-10-CM | POA: Insufficient documentation

## 2011-12-19 DIAGNOSIS — Z569 Unspecified problems related to employment: Secondary | ICD-10-CM

## 2011-12-19 DIAGNOSIS — Z23 Encounter for immunization: Secondary | ICD-10-CM

## 2011-12-19 DIAGNOSIS — F438 Other reactions to severe stress: Secondary | ICD-10-CM

## 2011-12-19 DIAGNOSIS — Z566 Other physical and mental strain related to work: Secondary | ICD-10-CM | POA: Insufficient documentation

## 2011-12-19 LAB — POCT URINALYSIS DIPSTICK
Blood, UA: NEGATIVE
Nitrite, UA: NEGATIVE
Spec Grav, UA: 1.01
Urobilinogen, UA: 0.2
pH, UA: 7

## 2011-12-19 MED ORDER — PHENTERMINE HCL 37.5 MG PO CAPS: 37.5000 mg | ORAL_CAPSULE | ORAL | Status: DC

## 2011-12-19 NOTE — Patient Instructions (Signed)
Encourage getting support help about work . Can increase the phentermine for now done skip eating regularly .  rov in another month or as needed. Your kidney function is better .

## 2011-12-19 NOTE — Progress Notes (Signed)
  Subjective:    Patient ID: Tiffany Roth, female    DOB: 1990-11-14, 21 y.o.   MRN: [id number]  HPI Pt comes in for fu of  meds and labs Since last visit no se of meds phentermine 15 some dec appetite but not eating as much cause distressed about job situation Teaches 2 grades at [employer]  Not yet trained in [employer] tl ike job and  Makes her feel sick in ; hostile work environment  without much support   Review of Systems No cp sob fever syncope hopelessness bleeding skin flare. Past history family history social history reviewed in the electronic medical record.    Objective:   Physical Exam BP 128/70  Pulse 80  Temp 98.5 F (36.9 C) (Oral)  Wt 233 lb (105.688 kg)  SpO2 98%  LMP 12/12/2011 Wt Readings from Last 3 Encounters:  12/19/11 233 lb (105.688 kg)  11/25/11 236 lb (107.049 kg)  10/12/11 224 lb (101.606 kg)   WDWN in nad  Stressed appearing Oriented x 3 and no noted deficits in memory, attention, and speech. Good eye contact  Looks allergic.     Assessment & Plan:  Weight gain  Has lost a few pounds obesity management  Counseled. increase to 37.5   Counseled. Elevated creatinine better down to 1 follow  Reactive mood unrelated to above .  Related to job   Counseled. Strategies   Disc getting help in this area and fu if  Needed. Medication not indicated at this time   ROV ina month or prn  Flu shot Total visit > 50% spent counseling and coordinating care

## 2012-01-04 ENCOUNTER — Telehealth: Payer: Self-pay | Admitting: Family Medicine

## 2012-01-04 NOTE — Telephone Encounter (Signed)
Patient's prior auth on Phentermine was denied by insurance. She would have to pay out of pocket for this medication. Please advise - thanks!

## 2012-01-05 NOTE — Telephone Encounter (Signed)
Left message for the pt to return my call. 

## 2012-01-06 NOTE — Telephone Encounter (Signed)
Left message for the pt to return my call. 

## 2012-01-06 NOTE — Telephone Encounter (Signed)
Please document what you told the pt. 

## 2012-01-09 NOTE — Telephone Encounter (Signed)
Patient's insurance denied the Phentermine on 10/30. I notified the pharmacy that it was denied and sent a note to you. The nurse/CMA usually notifies the pt of denial, so they can discuss alternative meds, options, etc. On 11/1, the patient called the office, and I happened to answer the call. She stated she was returning your call, so I transferred her to your phone. I never discussed the medication denial with the patient in any capacity.

## 2012-01-09 NOTE — Telephone Encounter (Signed)
Left message for the pt to return my call. 

## 2012-01-10 NOTE — Telephone Encounter (Signed)
Tried reaching the pt by telephone.  Unable to leave a message on her cell.  Mailbox is full.  Will try again later.

## 2012-01-10 NOTE — Telephone Encounter (Signed)
Pt notified by telephone.  At this time she is going to stop the pursuit of this medication or others like it.  She will think about what she wants to do and call back.

## 2012-03-19 ENCOUNTER — Encounter: Payer: Self-pay | Admitting: Internal Medicine

## 2012-03-19 ENCOUNTER — Ambulatory Visit (INDEPENDENT_AMBULATORY_CARE_PROVIDER_SITE_OTHER): Payer: BC Managed Care – PPO | Admitting: Internal Medicine

## 2012-03-19 VITALS — BP 114/70 | HR 112 | Temp 98.4°F | Wt 230.0 lb

## 2012-03-19 DIAGNOSIS — R197 Diarrhea, unspecified: Secondary | ICD-10-CM | POA: Insufficient documentation

## 2012-03-19 DIAGNOSIS — R1110 Vomiting, unspecified: Secondary | ICD-10-CM

## 2012-03-19 DIAGNOSIS — R0989 Other specified symptoms and signs involving the circulatory and respiratory systems: Secondary | ICD-10-CM

## 2012-03-19 DIAGNOSIS — A09 Infectious gastroenteritis and colitis, unspecified: Secondary | ICD-10-CM

## 2012-03-19 DIAGNOSIS — R0683 Snoring: Secondary | ICD-10-CM

## 2012-03-19 DIAGNOSIS — Z569 Unspecified problems related to employment: Secondary | ICD-10-CM

## 2012-03-19 DIAGNOSIS — Z566 Other physical and mental strain related to work: Secondary | ICD-10-CM

## 2012-03-19 MED ORDER — ONDANSETRON 4 MG PO TBDP: 4.0000 mg | ORAL_TABLET | Freq: Three times a day (TID) | ORAL | Status: DC | PRN

## 2012-03-19 NOTE — Progress Notes (Signed)
Chief Complaint  Patient presents with  . Diarrhea    Started on Friday of last week.  Would also like to discuss sleep apnea.  She is waking in the middle of the night gasping for air and she is snoring.  She is very tired during the day.  She has started seeing a counselor of Fridays.  The counselor would like for her to see a psychiatrist for depression and medication management.  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  . Emesis  . Fatigue    HPI: Patient comes in today for SDA for  new problems evaluation.   Watery diarrhea in the past 5 days  Up to 6 per day even if not eating   And no diarrhea.   Vomiting  At beginning.   Last pm    Not taking anything for it.   No abd pain.   tried immodium x 1  Using gatorade  No blood     Wakening  In night   Dad says snore badly   And   ? If sleep apnea.     Counseling  began Friday  And  Advised  Some interventions    To see psych for poss meds at end of month  Stress at work .     ROS: See pertinent positives and negatives per HPI.no cough sob has eczema  Using topicals now  Off phentermine  insurance doesn't pay for this  Past Medical History  Diagnosis Date  . 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 Prematurity     "3 months" 1# 15 oz" ventilator   . 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 Thyroid disease Mother   . Hypertension Mother     History   Social History  . Marital Status: Single    Spouse Name: N/A    Number of Children: N/A  . Years of Education: N/A   Social History Main Topics  . Smoking status: Never Smoker   . Smokeless tobacco: None  . Alcohol Use: Yes     Comment: socially    . Drug Use: No  . Sexually Active: None   Other Topics Concern  . 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    Outpatient Encounter Prescriptions as of 03/19/2012  Medication Sig Dispense Refill  . triamcinolone (KENALOG) 0.1 % ointment Mix 1.1 with aquaphor to used bid to eczema  454 g  2  . ondansetron (ZOFRAN-ODT) 4 MG disintegrating tablet Take 1 tablet (4 mg total) by mouth every 8 (eight) hours as needed for nausea (vomiting).  20 tablet  0  . [DISCONTINUED] phentermine 37.5 MG capsule Take 1 capsule (37.5 mg total) by mouth every morning.  30 capsule  0    EXAM:  BP 114/70  Pulse 112  Temp 98.4 F (36.9 C) (Oral)  Wt 230 lb (104.327 kg)  SpO2 99%  LMP 03/14/2012  There is no height on file to calculate BMI. Wt Readings from Last 3  Encounters:  03/19/12 230 lb (104.327 kg)  12/19/11 233 lb (105.688 kg)  11/25/11 236 lb (107.049 kg)    GENERAL: vitals reviewed and listed above, alert, oriented, appears well hydrated and in no acute distress looks  under the weather non icteric   HEENT: atraumatic, conjunctiva  clear, no obvious abnormalities on inspection of external nose and ears OP : no lesion edema or exudate  Low palat tonsil 1 +   NECK: no obvious masses on inspection palpation   LUNGS: clear to auscultation bilaterally, no wheezes, rales or rhonchi, good air movement  CV: HRRR, no clubbing cyanosis or  peripheral edema nl cap refill  Abdomen:  Sof,t normal bowel sounds without hepatosplenomegaly, no guarding rebound or masses no CVA tenderness mild tenderness left upper  MS: moves all extremities without noticeable focal  abnormality  PSYCH: pleasant and cooperative, some stress   ASSESSMENT AND PLAN:  Discussed the following assessment and plan:  1. Vomiting and diarrhea   2. Diarrhea of infectious origin   3. Stress at work   4. Snoring    refer for eval sleep  ? if need  to evaluate  more  for  sleep apnea    -Patient advised to return or notify health care team  immediately if symptoms worsen or persist or new concerns arise.  Patient Instructions  Agree this diarrhea  Is probably an bowel infection that should resolve with time .    Can try and antinausea vomiting   Medication .   Avoid  Juices and caffiene.  immodium ok but   Is not a cure .   [facility]ill do referral  For sleep sx . Expect  Sig improvement in the next 4-5 days   FU if not a lot better next week.  Agree with fu as above    Viral Gastroenteritis Viral gastroenteritis is also known as stomach flu. This condition affects the stomach and intestinal tract. It can cause sudden diarrhea and vomiting. The illness typically lasts 3 to 8 days. Most people develop an immune response that eventually gets rid of the virus. While this natural response develops, the virus can make you quite ill. CAUSES  Many different viruses can cause gastroenteritis, such as rotavirus or noroviruses. You can catch one of these viruses by consuming contaminated food or water. You may also catch a virus by sharing utensils or other personal items with an infected person or by touching a contaminated surface. SYMPTOMS  The most common symptoms are diarrhea and vomiting. These problems can cause a severe loss of body fluids (dehydration) and a body salt (electrolyte) imbalance. Other symptoms may include:  Fever.  Headache.  Fatigue.  Abdominal pain. DIAGNOSIS  Your caregiver can usually diagnose viral gastroenteritis based on your symptoms and a physical exam. A stool sample may also be taken to test for the presence of viruses or other infections. TREATMENT  This illness typically goes away on its own. Treatments are aimed at rehydration. The most serious cases of viral gastroenteritis involve vomiting so severely that you are not able to keep fluids down. In these cases, fluids must be given through an intravenous line  (IV). HOME CARE INSTRUCTIONS   Drink enough fluids to keep your urine clear or pale yellow. Drink small amounts of fluids frequently and increase the amounts as tolerated.  Ask your caregiver for specific rehydration instructions.  Avoid:  Foods high in sugar.  Alcohol.  Carbonated drinks.  Tobacco.  Juice.  Caffeine drinks.  Extremely hot or cold fluids.  Fatty, greasy foods.  Too much intake of anything at one time.  Dairy products until 24 to 48 hours after diarrhea stops.  You may consume probiotics. Probiotics are active cultures of beneficial bacteria. They may lessen the amount and number of diarrheal stools in adults. Probiotics can be found in yogurt with active cultures and in supplements.  Wash your hands well to avoid spreading the virus.  Only take over-the-counter or prescription medicines for pain, discomfort, or fever as directed by your caregiver. Do not give aspirin to children. Antidiarrheal medicines are not recommended.  Ask your caregiver if you should continue to take your regular prescribed and over-the-counter medicines.  Keep all follow-up appointments as directed by your caregiver. SEEK IMMEDIATE MEDICAL CARE IF:   You are unable to keep fluids down.  You do not urinate at least once every 6 to 8 hours.  You develop shortness of breath.  You notice blood in your stool or vomit. This may look like coffee grounds.  You have abdominal pain that increases or is concentrated in one small area (localized).  You have persistent vomiting or diarrhea.  You have a fever.  The patient is a child younger than 3 months, and he or she has a fever.  The patient is a child older than 3 months, and he or she has a fever and persistent symptoms.  The patient is a child older than 3 months, and he or she has a fever and symptoms suddenly get worse.  The patient is a baby, and he or she has no tears when crying. MAKE SURE YOU:   Understand these  instructions.  Will watch your condition.  Will get help right away if you are not doing well or get worse. Document Released: 02/21/2005 Document Revised: 05/16/2011 Document Reviewed: 12/08/2010 [facility] Patient Information 2013 [place], [place].      Neta Mends. Curstin Schmale M.D.

## 2012-03-19 NOTE — Patient Instructions (Addendum)
Agree this diarrhea  Is probably an bowel infection that should resolve with time .    Can try and antinausea vomiting   Medication .   Avoid  Juices and caffiene.  immodium ok but   Is not a cure .   Weill do referral  For sleep sx . Expect  Sig improvement in the next 4-5 days   FU if not a lot better next week.  Agree with fu as above    Viral Gastroenteritis Viral gastroenteritis is also known as stomach flu. This condition affects the stomach and intestinal tract. It can cause sudden diarrhea and vomiting. The illness typically lasts 3 to 8 days. Most people develop an immune response that eventually gets rid of the virus. While this natural response develops, the virus can make you quite ill. CAUSES  Many different viruses can cause gastroenteritis, such as rotavirus or noroviruses. You can catch one of these viruses by consuming contaminated food or water. You may also catch a virus by sharing utensils or other personal items with an infected person or by touching a contaminated surface. SYMPTOMS  The most common symptoms are diarrhea and vomiting. These problems can cause a severe loss of body fluids (dehydration) and a body salt (electrolyte) imbalance. Other symptoms may include:  Fever.  Headache.  Fatigue.  Abdominal pain. DIAGNOSIS  Your caregiver can usually diagnose viral gastroenteritis based on your symptoms and a physical exam. A stool sample may also be taken to test for the presence of viruses or other infections. TREATMENT  This illness typically goes away on its own. Treatments are aimed at rehydration. The most serious cases of viral gastroenteritis involve vomiting so severely that you are not able to keep fluids down. In these cases, fluids must be given through an intravenous line (IV). HOME CARE INSTRUCTIONS   Drink enough fluids to keep your urine clear or pale yellow. Drink small amounts of fluids frequently and increase the amounts as tolerated.  Ask your  caregiver for specific rehydration instructions.  Avoid:  Foods high in sugar.  Alcohol.  Carbonated drinks.  Tobacco.  Juice.  Caffeine drinks.  Extremely hot or cold fluids.  Fatty, greasy foods.  Too much intake of anything at one time.  Dairy products until 24 to 48 hours after diarrhea stops.  You may consume probiotics. Probiotics are active cultures of beneficial bacteria. They may lessen the amount and number of diarrheal stools in adults. Probiotics can be found in yogurt with active cultures and in supplements.  Wash your hands well to avoid spreading the virus.  Only take over-the-counter or prescription medicines for pain, discomfort, or fever as directed by your caregiver. Do not give aspirin to children. Antidiarrheal medicines are not recommended.  Ask your caregiver if you should continue to take your regular prescribed and over-the-counter medicines.  Keep all follow-up appointments as directed by your caregiver. SEEK IMMEDIATE MEDICAL CARE IF:   You are unable to keep fluids down.  You do not urinate at least once every 6 to 8 hours.  You develop shortness of breath.  You notice blood in your stool or vomit. This may look like coffee grounds.  You have abdominal pain that increases or is concentrated in one small area (localized).  You have persistent vomiting or diarrhea.  You have a fever.  The patient is a child younger than 3 months, and he or she has a fever.  The patient is a child older than 3 months, and  he or she has a fever and persistent symptoms.  The patient is a child older than 3 months, and he or she has a fever and symptoms suddenly get worse.  The patient is a baby, and he or she has no tears when crying. MAKE SURE YOU:   Understand these instructions.  Will watch your condition.  Will get help right away if you are not doing well or get worse. Document Released: 02/21/2005 Document Revised: 05/16/2011 Document  Reviewed: 12/08/2010 [facility] Patient Information 2013 [place], [place].

## 2012-04-03 ENCOUNTER — Institutional Professional Consult (permissible substitution): Payer: BC Managed Care – PPO | Admitting: Pulmonary Disease

## 2012-04-12 ENCOUNTER — Encounter: Payer: Self-pay | Admitting: Pulmonary Disease

## 2012-04-12 ENCOUNTER — Ambulatory Visit (INDEPENDENT_AMBULATORY_CARE_PROVIDER_SITE_OTHER): Payer: BC Managed Care – PPO | Admitting: Pulmonary Disease

## 2012-04-12 VITALS — BP 112/80 | HR 96 | Temp 97.5°F | Ht 61.0 in | Wt 241.0 lb

## 2012-04-12 DIAGNOSIS — G4733 Obstructive sleep apnea (adult) (pediatric): Secondary | ICD-10-CM

## 2012-04-12 NOTE — Patient Instructions (Addendum)
Will schedule for home sleep testing, and will call once results available.  Work on weight reduction.

## 2012-04-12 NOTE — Progress Notes (Signed)
  Subjective:    Patient ID: Tiffany Roth, female    DOB: [birth year], 22 y.o.   MRN: [id number]  HPI The patient is a 22 year old female who been asked to see for possible obstructive sleep apnea.  She's been noted to have loud snoring, as well as an abnormal breathing pattern during sleep.  She has also noted gasping arousals fairly commonly.  She has frequent awakenings at night, and has not rested in the mornings upon arising.  She notes definite sleep pressure when quiet during the day, and will fall asleep easily with television or movies in the evening.  She also has some degree of sleepiness with driving.  The patient states that her weight is up 40 pounds over the last 2 years, and her Epworth score today is 20.  Sleep Questionnaire: What time do you typically go to bed?( Between what hours) 7p-8p How long does it take you to fall asleep? 10-60mins How many times during the night do you wake up? 3 What time do you get out of bed to start your day? 0500 Do you drive or operate heavy machinery in your occupation? No How much has your weight changed (up or down) over the past two years? (In pounds) 40 lb (18.144 kg) Have you ever had a sleep study before? No Do you currently use CPAP? No Do you wear oxygen at any time? No    Review of Systems  Constitutional: Positive for unexpected weight change. Negative for fever.  HENT: Negative for ear pain, nosebleeds, congestion, sore throat, rhinorrhea, sneezing, trouble swallowing, dental problem, postnasal drip and sinus pressure.   Eyes: Negative for redness and itching.  Respiratory: Negative for cough, chest tightness, shortness of breath and wheezing.   Cardiovascular: Negative for palpitations and leg swelling.  Gastrointestinal: Negative for nausea and vomiting.  Genitourinary: Negative for dysuria.  Musculoskeletal: Negative for joint swelling.  Skin: Positive for rash ( eczema).  Neurological: Negative for headaches.  Hematological:  Does not bruise/bleed easily.  Psychiatric/Behavioral: Positive for dysphoric mood. The patient is not nervous/anxious.        Objective:   Physical Exam Constitutional:  Obese female, no acute distress  HENT:  Nares patent without discharge, but large turbinates that are edematous.   Oropharynx without exudate, palate normal, uvula elongated.   Eyes:  Perrla, eomi, no scleral icterus  Neck:  No JVD, no TMG  Cardiovascular:  Normal rate, regular rhythm, no rubs or gallops.  No murmurs        Intact distal pulses  Pulmonary :  Normal breath sounds, no stridor or respiratory distress   No rales, rhonchi, or wheezing  Abdominal:  Soft, nondistended, bowel sounds present.  No tenderness noted.   Musculoskeletal:  1+ lower extremity edema noted, +varicosities.   Lymph Nodes:  No cervical lymphadenopathy noted  Skin:  No cyanosis noted  Neurologic:  Appears sleepy but appropriate, moves all 4 extremities without obvious deficit.         Assessment & Plan:

## 2012-04-12 NOTE — Assessment & Plan Note (Signed)
The patient's history is very suggestive of clinically significant sleep apnea.  I've had long discussion with her about the pathophysiology of sleep apnea, including its impact to her quality of life and cardiovascular health.  I think she needs to have a sleep study for diagnosis, and given her age, lack of significant medical illnesses, I think she would be a very good candidate for home sleep testing.  The patient is agreeable.  We'll arrange followup once the results are available.

## 2012-04-13 ENCOUNTER — Institutional Professional Consult (permissible substitution): Payer: BC Managed Care – PPO | Admitting: Pulmonary Disease

## 2012-04-25 ENCOUNTER — Telehealth: Payer: Self-pay | Admitting: Pulmonary Disease

## 2012-04-25 NOTE — Telephone Encounter (Signed)
Spoke to pt she is aware [employer] approved her to do a home sleep study she will pick up machine 05/02/12@3 :00pm [name]

## 2012-04-30 ENCOUNTER — Ambulatory Visit (INDEPENDENT_AMBULATORY_CARE_PROVIDER_SITE_OTHER): Payer: BC Managed Care – PPO | Admitting: Family Medicine

## 2012-04-30 ENCOUNTER — Encounter: Payer: Self-pay | Admitting: Family Medicine

## 2012-04-30 VITALS — BP 102/74 | HR 108 | Temp 97.6°F | Wt 236.0 lb

## 2012-04-30 DIAGNOSIS — G4733 Obstructive sleep apnea (adult) (pediatric): Secondary | ICD-10-CM

## 2012-04-30 DIAGNOSIS — A088 Other specified intestinal infections: Secondary | ICD-10-CM

## 2012-04-30 DIAGNOSIS — A084 Viral intestinal infection, unspecified: Secondary | ICD-10-CM

## 2012-04-30 MED ORDER — ONDANSETRON HCL 4 MG PO TABS: 4.0000 mg | ORAL_TABLET | Freq: Three times a day (TID) | ORAL | Status: DC | PRN

## 2012-04-30 NOTE — Progress Notes (Signed)
Chief Complaint  Patient presents with  . 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  Past Medical History  Diagnosis Date  . 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 Prematurity     "3 months" 1# 15 oz" ventilator   . 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 Thyroid disease Mother   . Hypertension Mother     History   Social History  . Marital Status: Single    Spouse Name: N/A    Number of Children: N/A  . 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 Smoking status: Never Smoker   . Smokeless tobacco: None  . Alcohol Use: Yes     Comment: socially  . Drug Use: No  . Sexually Active: None   Other Topics Concern  . None   Social History Narrative   Administrator, Civil Service   Pet dog non smoker    Home for the summer.    Current outpatient prescriptions:triamcinolone (KENALOG) 0.1 % ointment, Mix 1.1 with aquaphor to used bid to eczema, Disp: 454 g, Rfl: 2;  ondansetron (ZOFRAN) 4 MG tablet, Take 1 tablet (4 mg total) by mouth every 8 (eight) hours as needed for nausea., Disp: 20 tablet, Rfl: 0  EXAM:  Filed Vitals:   04/30/12 1102  BP: 102/74  Pulse: 108  Temp: 97.6 F (36.4 C)    Body mass index is 44.61 kg/(m^2).  GENERAL: vitals  reviewed and listed above, alert, oriented, appears well hydrated and in no acute distress  HEENT: atraumatic, conjunttiva clear, no obvious abnormalities on inspection of external nose and ears  NECK: no obvious masses on inspection  LUNGS: clear to auscultation bilaterally, no wheezes, rales or rhonchi, good air movement  CV: HRRR, no peripheral edema  MS: moves all extremities without noticeable abnormality  ABD: soft, BS +, NTTP  PSYCH: pleasant and cooperative, no obvious depression or anxiety  ASSESSMENT AND PLAN:  Discussed the following assessment and plan:  Viral gastroenteritis - Plan: ondansetron (ZOFRAN) 4 MG tablet  -smx and exam c/w viral gastroenteritis - advised supportive care and oral rehydration and return precuations -Patient advised to return or notify a doctor immediately if symptoms worsen or persist or new concerns arise.  Patient Instructions  -lots of fluids all day long  zofran if needed for nausea  Imodium (loperamide) for the diarrhea  No dairy for 1 week  Follow up if worsening, can not tolerate fluids by mouth or other concerns or does not resolve      Latausha Flamm R.

## 2012-04-30 NOTE — Patient Instructions (Addendum)
-  lots of fluids all day long  zofran if needed for nausea  Imodium (loperamide) for the diarrhea  No dairy for 1 week  Follow up if worsening, can not tolerate fluids by mouth or other concerns or does not resolve

## 2012-05-08 ENCOUNTER — Telehealth: Payer: Self-pay | Admitting: Pulmonary Disease

## 2012-05-08 NOTE — Telephone Encounter (Signed)
Let her know that I just reviewed and she does have sleep apnea.  I would like her to come in so we can discuss various treatment options.

## 2012-05-08 NOTE — Telephone Encounter (Signed)
I spoke with pt and she is scheduled to see [facility] tomorrow at 9:45

## 2012-05-08 NOTE — Telephone Encounter (Signed)
I spoke with pt and she is wanted to know if Tiffany Roth has reviewed her home sleep study. Please advise KC thanks

## 2012-05-09 ENCOUNTER — Encounter: Payer: Self-pay | Admitting: Pulmonary Disease

## 2012-05-09 ENCOUNTER — Ambulatory Visit (INDEPENDENT_AMBULATORY_CARE_PROVIDER_SITE_OTHER): Payer: BC Managed Care – PPO | Admitting: Pulmonary Disease

## 2012-05-09 VITALS — BP 106/76 | HR 88 | Temp 98.2°F | Ht 61.0 in | Wt 236.0 lb

## 2012-05-09 DIAGNOSIS — G4733 Obstructive sleep apnea (adult) (pediatric): Secondary | ICD-10-CM

## 2012-05-09 NOTE — Assessment & Plan Note (Signed)
The patient has mild sleep apnea by her recent sleep test, but is having significant impact to her quality of life.  I have outlined various treatment options such as a trial of weight loss alone, upper airway surgery, dental appliance, and also CPAP.  After a long conversation with her and her family, they have decided on a CPAP trial while working on weight loss.  I think this is her best treatment considering her degree of symptoms.

## 2012-05-09 NOTE — Patient Instructions (Addendum)
Will start on cpap at moderate pressure level.  Please call if issues with tolerance. Work on weight loss followup with me in 6 weeks.

## 2012-05-09 NOTE — Progress Notes (Signed)
  Subjective:    Patient ID: Tiffany Roth, female    DOB: 1990/10/23, 22 y.o.   MRN: [id number]  HPI The patient comes in today for followup of her recent sleep test.  She was found to have mild obstructive sleep apnea, with an AHI of 11 events per hour.  I have reviewed this study with her and a family member, and answered all their questions.   Review of Systems  Constitutional: Negative for fever and unexpected weight change.  HENT: Negative for ear pain, nosebleeds, congestion, sore throat, rhinorrhea, sneezing, trouble swallowing, dental problem, postnasal drip and sinus pressure.   Eyes: Negative for redness and itching.  Respiratory: Negative for cough, chest tightness, shortness of breath and wheezing.   Cardiovascular: Negative for palpitations and leg swelling.  Gastrointestinal: Negative for nausea and vomiting.  Genitourinary: Negative for dysuria.  Musculoskeletal: Negative for joint swelling.  Skin: Positive for rash. Wound:  eczema.  Neurological: Negative for headaches.  Hematological: Does not bruise/bleed easily.  Psychiatric/Behavioral: The patient is not nervous/anxious.        Treated with Wellbutrin 150       Objective:   Physical Exam Obese female in no acute distress Nose without purulent discharge noted Neck without lymphadenopathy or thyromegaly Lower extremities with mild edema, no cyanosis Alert and oriented, moves all 4 extremities.       Assessment & Plan:

## 2012-06-20 ENCOUNTER — Ambulatory Visit: Payer: BC Managed Care – PPO | Admitting: Pulmonary Disease

## 2012-06-21 ENCOUNTER — Telehealth: Payer: Self-pay | Admitting: Pulmonary Disease

## 2012-06-21 NOTE — Telephone Encounter (Signed)
[phone]  Returning call

## 2012-06-21 NOTE — Telephone Encounter (Signed)
Called and spoke with pt and she stated that her mother gave her the message to call us back.  i dont see where we have tried to call the pt.  Will forward to ashtyn to see if she was trying to contact the pt.  Please advise. thanks

## 2012-06-21 NOTE — Telephone Encounter (Signed)
Spoke to pt she is aware we have her cpap download and kc will look at it and call her back [name]

## 2012-06-21 NOTE — Telephone Encounter (Signed)
Spoke with patient. I have no idea who called her or what they could have needed. Patient states that whoever spoke with her mother this morning from our office did not specify why they were calling and did not give a call back name.  Pt states that she gave her CPAP card to [facility] to D/L.  Libby, did you contact patient today by any chance in regards to a D/L or anything faxed from [facility]? Trying to figure out who called the patient and for what? Thanks.

## 2012-06-21 NOTE — Telephone Encounter (Signed)
lmomtcb  

## 2012-07-05 ENCOUNTER — Telehealth: Payer: Self-pay | Admitting: Pulmonary Disease

## 2012-07-05 NOTE — Telephone Encounter (Signed)
Per message 06/21/12  [name] at 06/21/2012 5:08 PM Spoke with Tiffany Roth she is aware we have her cpap download and kc will look at it and call her back  [name]  [facility] please advise if you have reviewed this. Thanks.

## 2012-07-05 NOTE — Telephone Encounter (Signed)
Pt aware KC has to review download await call back.Tiffany Roth

## 2012-07-06 NOTE — Telephone Encounter (Signed)
This is the download for dme to get paid.    You can tell her download looks fine, and to keep upcoming apptm with me.

## 2012-07-06 NOTE — Telephone Encounter (Signed)
This is in your green folder. Thanks

## 2012-07-06 NOTE — Telephone Encounter (Signed)
I do not have this download, nor I have I seen it.

## 2012-07-09 NOTE — Telephone Encounter (Signed)
lmomtcb x1 

## 2012-07-10 NOTE — Telephone Encounter (Signed)
ATC patient, no answer [id number] 

## 2012-07-10 NOTE — Telephone Encounter (Signed)
Pt returned call. Tiffany Roth  

## 2012-07-10 NOTE — Telephone Encounter (Signed)
I spoke with pt and is aware. 

## 2012-07-18 ENCOUNTER — Encounter: Payer: Self-pay | Admitting: Pulmonary Disease

## 2012-07-23 ENCOUNTER — Ambulatory Visit: Payer: BC Managed Care – PPO | Admitting: Pulmonary Disease

## 2012-08-22 ENCOUNTER — Telehealth: Payer: Self-pay | Admitting: Internal Medicine

## 2012-08-22 NOTE — Telephone Encounter (Signed)
She has had significant  varicosities and swelling and discomfort .   That has been present for a while .   Ok to re refer  But if needs a visit we can do this also .

## 2012-08-22 NOTE — Telephone Encounter (Signed)
PT called and stated that she was seen by Dr. Fatima Sanger, a vein specialist. He instructed her that she needed another referral stating that the pt has spoken with Dr. Fabian Sharp about the pain in her veins, and that the pt has discussed her varicose veins with Dr. Fabian Sharp. Please assist.

## 2012-08-23 ENCOUNTER — Other Ambulatory Visit: Payer: Self-pay | Admitting: Family Medicine

## 2012-08-23 DIAGNOSIS — I83813 Varicose veins of bilateral lower extremities with pain: Secondary | ICD-10-CM

## 2012-08-23 DIAGNOSIS — I8390 Asymptomatic varicose veins of unspecified lower extremity: Secondary | ICD-10-CM

## 2012-08-23 NOTE — Telephone Encounter (Signed)
Order placed in the system. 

## 2012-10-09 ENCOUNTER — Ambulatory Visit: Payer: BC Managed Care – PPO | Admitting: Internal Medicine

## 2012-10-10 ENCOUNTER — Telehealth: Payer: Self-pay | Admitting: Internal Medicine

## 2012-10-10 NOTE — Telephone Encounter (Signed)
Patient Information:  Caller Name: Beila  Phone: [phone]  Patient: Tiffany Roth, Tiffany Roth  Gender: Female  DOB: [birth year]  Age: 22 Years  PCP: Berniece Andreas [facility])  Pregnant: No  Office Follow Up:  Does the office need to follow up with this patient?: No  Instructions For The Office: N/A  RN Note:  Pt has history of Eczema, Pt had flare up on Torso, Face, Neck and Arms, not relieved by home care.  Pt is a runner.  Pt has appt on 8-7 at 10:15 prior to triage.  Eczema has redness around dry spots.  Discussed OTC hydrocortizone cream topical application and to continue Benadryl for itching.  Advised Pt to call back if redness began to spread or fever developed.  Symptoms  Reason For Call & Symptoms: Eczema Flare up  Reviewed Health History In EMR: Yes  Reviewed Medications In EMR: Yes  Reviewed Allergies In EMR: Yes  Reviewed Surgeries / Procedures: Yes  Date of Onset of Symptoms: 10/09/2012  Treatments Tried: Aqua-four, Benadryl  Treatments Tried Worked: No OB / GYN:  LMP: 10/10/2012  Guideline(s) Used:  Skin Lesion - Moles or Growths  Disposition Per Guideline:   See Within 3 Days in Office  Reason For Disposition Reached:   Patient wants to be seen  Advice Given:  Call Back If:  Fever or pain occurs  Patient Will Follow Care Advice:  YES

## 2012-10-11 ENCOUNTER — Encounter: Payer: Self-pay | Admitting: Internal Medicine

## 2012-10-11 ENCOUNTER — Ambulatory Visit (INDEPENDENT_AMBULATORY_CARE_PROVIDER_SITE_OTHER): Payer: BC Managed Care – PPO | Admitting: Internal Medicine

## 2012-10-11 VITALS — BP 126/70 | HR 92 | Temp 97.5°F | Wt 258.0 lb

## 2012-10-11 DIAGNOSIS — L259 Unspecified contact dermatitis, unspecified cause: Secondary | ICD-10-CM

## 2012-10-11 DIAGNOSIS — L988 Other specified disorders of the skin and subcutaneous tissue: Secondary | ICD-10-CM

## 2012-10-11 DIAGNOSIS — L303 Infective dermatitis: Secondary | ICD-10-CM | POA: Insufficient documentation

## 2012-10-11 MED ORDER — CEPHALEXIN 500 MG PO CAPS: 500.0000 mg | ORAL_CAPSULE | Freq: Three times a day (TID) | ORAL | Status: AC

## 2012-10-11 MED ORDER — HYDROXYZINE HCL 25 MG PO TABS: 25.0000 mg | ORAL_TABLET | Freq: Three times a day (TID) | ORAL | Status: AC | PRN

## 2012-10-11 MED ORDER — PREDNISONE 10 MG PO TABS: ORAL_TABLET | ORAL | Status: DC

## 2012-10-11 NOTE — Patient Instructions (Signed)
Prednisone and antiobiotic at this time.  Can use hydroxyzine as needed for itching at night  Do not drive with this.   Medication.   Advise seen dermatologist  For management  Because of severity of this problem.    [facility]  Dermatology department is excellent      If no dermatology in town.

## 2012-10-11 NOTE — Progress Notes (Signed)
Chief Complaint  Patient presents with  . Eczema    HPI: Patient comes in today for SDA for  new problem evaluation. Onset break on face .  Then all over body.   recently no fever chills  Seems to get flare when gets hot and sweaty .   Hands irritated  And oozy some .  Lips chapped   Itching all over . 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  Previous dermatologist Dr. Joseph Art is now deceased. Other dermatologists have been out of network ROS: See pertinent positives and negatives per HPI. Anxiety is a lot less no chest pain shortness of breath ointment swelling  Past Medical History  Diagnosis Date  . 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 Prematurity     "3 months" 1# 15 oz" ventilator   . 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 Thyroid disease Mother   . Hypertension Mother     History   Social History  . Marital Status: Single    Spouse Name: N/A    Number of Children: N/A  . 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 Smoking status: Never Smoker   . Smokeless tobacco: None  . Alcohol Use: Yes     Comment: socially  . Drug Use: No  . Sexually Active: None   Other Topics Concern  . None   Social History Narrative   Administrator, Civil Service   Pet dog non smoker    Home for the summer.   Is working as a Research officer, political party third grade   Doing much better less stress    Outpatient Encounter Prescriptions as of 10/11/2012  Medication Sig Dispense Refill  .  triamcinolone (KENALOG) 0.1 % ointment Mix 1.1 with aquaphor to used bid to eczema  454 g  2  . cephALEXin (KEFLEX) 500 MG capsule Take 1 capsule (500 mg total) by mouth 3 (three) times daily.  30 capsule  0  . hydrOXYzine (ATARAX/VISTARIL) 25 MG tablet Take 1 tablet (25 mg total) by mouth 3 (three) times daily as needed for itching.  30 tablet  0  . predniSONE (DELTASONE) 10 MG tablet Take pills per day,6,6,6,4,4,4,2,2,2,1,1,1  40 tablet  0  . [DISCONTINUED] buPROPion (WELLBUTRIN XL) 150 MG 24 hr tablet Take 1 tablet by mouth daily.      . [DISCONTINUED] ondansetron (ZOFRAN) 4 MG tablet Take 1 tablet (4 mg total) by mouth every 8 (eight) hours as needed for nausea.  20 tablet  0   No facility-administered encounter medications on file as of 10/11/2012.    EXAM:  BP 126/70  Pulse 92  Temp(Src) 97.5 F (36.4 C) (Oral)  Wt 258 lb (117.028 kg)  BMI 48.77 kg/m2  SpO2 98%  LMP 10/01/2012  Body mass index is 48.77 kg/(m^2).  GENERAL: vitals reviewed and listed above, alert, oriented, appears well hydrated and in no acute  distress obvious rash  Face lipos hands chest and arms  NECK: no obvious masses on inspection palpation  MS: moves all extremities without noticeable focal  abnormality Derm  Cracking all over hands and  fingersr  Palms ok  reddneed ant chest and arms   No vesicles  Back clear   PSYCH: pleasant and cooperative, no obvious depression or anxiety  ASSESSMENT AND PLAN:  Discussed the following assessment and plan:  ECZEMA  Infectious eczematoid dermatitis - poss hx of same. This appears to be the third time she has had very extensive eczema and probable mild secondary infection at this time on review of record it does appear to be in the summer or hot weather. Uncertain if she is religious about using topicals before this takes off. Her hands are the worst.  Strongly suggest she get back in with dermatology for management so that she can avoid the systemic severe reaction.  Recurrent prednisone courses are not optimal. Suggest dermatology at [facility] or [facility] if they are in her network.  Patient agrees -Patient advised to return or notify health care team  if symptoms worsen or persist or new concerns arise.  Patient Instructions  Prednisone and antiobiotic at this time.  Can use hydroxyzine as needed for itching at night  Do not drive with this.   Medication.   Advise seen dermatologist  For management  Because of severity of this problem.    [facility]  Dermatology department is excellent      If no dermatology in town.    Neta Mends. Janayah Zavada M.D.

## 2013-02-21 ENCOUNTER — Ambulatory Visit (INDEPENDENT_AMBULATORY_CARE_PROVIDER_SITE_OTHER): Payer: BC Managed Care – PPO | Admitting: Family Medicine

## 2013-02-21 ENCOUNTER — Encounter: Payer: Self-pay | Admitting: Family Medicine

## 2013-02-21 ENCOUNTER — Encounter (INDEPENDENT_AMBULATORY_CARE_PROVIDER_SITE_OTHER): Payer: Self-pay

## 2013-02-21 VITALS — BP 110/74 | HR 87 | Temp 98.1°F | Wt 253.0 lb

## 2013-02-21 DIAGNOSIS — J329 Chronic sinusitis, unspecified: Secondary | ICD-10-CM

## 2013-02-21 MED ORDER — AMOXICILLIN 875 MG PO TABS: 875.0000 mg | ORAL_TABLET | Freq: Two times a day (BID) | ORAL | Status: DC

## 2013-02-21 NOTE — Progress Notes (Signed)
Chief Complaint  Patient presents with  . Cough    sneezing, congestion, fatigue     HPI:  Tiffany Roth is a 89 you F patient of Dr. Fabian Roth here for an   URI: -started about 10 days ago -symptoms: nasal congestion, PND, sinus pain and pressure, fatigue, cough, drainage, diarrhea, some nausea -denies: tooth pain, fevers, SOB, wheezing, body aches, flu exposure, ebola risks, travel -has tried otc remedies  ROS: See pertinent positives and negatives per HPI.  Past Medical History  Diagnosis Date  . 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 Prematurity     "3 months" 1# 15 oz" ventilator   . 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  Family History  Problem Relation Age of Onset  . Thyroid disease Mother   . Hypertension Mother     History   Social History  . Marital Status: Single    Spouse Name: N/A    Number of Children: N/A  . 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 Smoking status: Never Smoker   . Smokeless tobacco: None  . Alcohol Use: Yes     Comment: socially  . Drug Use: No  . Sexual Activity: None   Other Topics Concern  . None   Social History Narrative   Administrator, Civil Service   Pet dog non smoker    Home for the summer.   Is working as a Research officer, political party third grade   Doing much better less stress    Current outpatient prescriptions:amoxicillin (AMOXIL) 875 MG tablet, Take 1 tablet (875 mg total) by mouth 2 (two) times daily., Disp: 20 tablet, Rfl: 0;  triamcinolone (KENALOG) 0.1 % ointment, Mix 1.1 with aquaphor to used bid to eczema, Disp: 454 g, Rfl: 2  EXAM:  Filed Vitals:   02/21/13  1537  BP: 110/74  Pulse: 87  Temp: 98.1 F (36.7 C)    Body mass index is 47.83 kg/(m^2).  GENERAL: vitals reviewed and listed above, alert, oriented, appears well hydrated and in no acute distress  HEENT: atraumatic, conjunttiva clear, no obvious abnormalities on inspection of external nose and ears, normal appearance of ear canals and TMs, clear nasal congestion, mild post oropharyngeal erythema with PND, no tonsillar edema or exudate, no sinus TTP  NECK: no obvious masses on inspection  LUNGS: clear to auscultation bilaterally, no wheezes, rales or rhonchi, good air movement  CV: HRRR, no peripheral edema  MS: moves all extremities without noticeable abnormality  PSYCH: pleasant and cooperative, no obvious depression or anxiety  ASSESSMENT AND PLAN:  Discussed the following assessment and plan:  Sinusitis - Plan: amoxicillin (AMOXIL) 875 MG tablet  -likely started as viral illness, but concern for possible mild sinusitis without signs of serious illness today given length of symptoms and sinus pain -discussed options and recs per orders and instructions -med adverse effects and return precautions advised -Patient advised to return or notify a doctor immediately if symptoms worsen or persist or new concerns arise.  Patient Instructions  INSTRUCTIONS FOR UPPER RESPIRATORY INFECTION:  -plenty of rest  and fluids  -As we discussed, we have prescribed a new medication for you at this appointment. We discussed the common and serious potential adverse effects of this medication and you can review these and more with the pharmacist when you pick up your medication.  Please follow the instructions for use carefully and notify us immediately if you have any problems taking this medication.  -nasal saline wash 2-3 times daily (use prepackaged nasal saline or bottled/distilled water if making your own)   -can use sinex or afrin nasal spray for drainage and nasal congestion - but do  NOT use longer then 3-4 days  -can use tylenol or ibuprofen as directed for aches and sorethroat  -in the winter time, using a humidifier at night is helpful (please follow cleaning instructions)  -if you are taking a cough medication - use only as directed, may also try a teaspoon of honey to coat the throat and throat lozenges  -for sore throat, salt water gargles can help  -follow up if you have fevers, facial pain, tooth pain, difficulty breathing or are worsening or not getting better in 5-7 days      [name] R.

## 2013-02-21 NOTE — Patient Instructions (Signed)
INSTRUCTIONS FOR UPPER RESPIRATORY INFECTION:  -plenty of rest and fluids  -As we discussed, we have prescribed a new medication for you at this appointment. We discussed the common and serious potential adverse effects of this medication and you can review these and more with the pharmacist when you pick up your medication.  Please follow the instructions for use carefully and notify us immediately if you have any problems taking this medication.  -nasal saline wash 2-3 times daily (use prepackaged nasal saline or bottled/distilled water if making your own)   -can use sinex or afrin nasal spray for drainage and nasal congestion - but do NOT use longer then 3-4 days  -can use tylenol or ibuprofen as directed for aches and sorethroat  -in the winter time, using a humidifier at night is helpful (please follow cleaning instructions)  -if you are taking a cough medication - use only as directed, may also try a teaspoon of honey to coat the throat and throat lozenges  -for sore throat, salt water gargles can help  -follow up if you have fevers, facial pain, tooth pain, difficulty breathing or are worsening or not getting better in 5-7 days  

## 2013-04-17 ENCOUNTER — Other Ambulatory Visit: Payer: Self-pay | Admitting: Family Medicine

## 2013-04-17 ENCOUNTER — Telehealth: Payer: Self-pay | Admitting: Internal Medicine

## 2013-04-17 MED ORDER — ONDANSETRON HCL 4 MG PO TABS: 4.0000 mg | ORAL_TABLET | Freq: Three times a day (TID) | ORAL | Status: DC | PRN

## 2013-04-17 NOTE — Telephone Encounter (Signed)
Patient Information:  Caller Name: Mikle Bosworth  Phone: [phone]  Patient: Tiffany Roth  Gender: Female  DOB: August 28, 1990  Age: 23 Years  PCP: [facility])  Pregnant: No  Office Follow Up:  Does the office need to follow up with this patient?: Yes  Instructions For The Office: Pt was offered an appt at another location but declined. She ? if she can be worked in today. Please call.  RN Note:  Sx began this AM. Appt requested to r/o the flu. Currently sipping on fluids, resting in bed.   Symptoms  Reason For Call & Symptoms: Vomiting, diarrhea, headache, fever  Reviewed Health History In EMR: Yes  Reviewed Medications In EMR: Yes  Reviewed Allergies In EMR: Yes  Reviewed Surgeries / Procedures: Yes  Date of Onset of Symptoms: 04/17/2013  Any Fever: Yes  Fever Taken: Oral  Fever Time Of Reading: 10:00:00  Fever Last Reading: 102.3 OB / GYN:  LMP: 04/03/2013  Guideline(s) Used:  Vomiting  Disposition Per Guideline:   See Today in Office  Reason For Disposition Reached:   Patient wants to be seen  Advice Given:  Reassurance:  Vomiting can be caused by many types of illnesses. It can be caused by a stomach flu virus. It can be caused by eating or drinking something that disagreed with your stomach.  Adults with vomiting need to stay hydrated. This is the most important thing. If you don't drink and replace lost fluids, you may get dehydrated.  Clear Liquids:  Sip water or a rehydration drink (e.g., Gatorade or Powerade).  Patient Will Follow Care Advice:  YES

## 2013-04-17 NOTE — Telephone Encounter (Signed)
Spoke to the pt's mom.  Notified her that the pt will be seen on 04/18/13 @ 10:15 per [facility] and will send in Zofran 4 mg to take 1 po q 8 hr prn #12. Pt's mother will notify her of prescription and appointment.  Pt's mother said the pt is resting and would not let me speak to her.

## 2013-04-18 ENCOUNTER — Encounter: Payer: Self-pay | Admitting: Internal Medicine

## 2013-04-18 ENCOUNTER — Ambulatory Visit (INDEPENDENT_AMBULATORY_CARE_PROVIDER_SITE_OTHER): Payer: BC Managed Care – PPO | Admitting: Internal Medicine

## 2013-04-18 VITALS — BP 110/78 | HR 103 | Temp 98.6°F | Ht 61.0 in | Wt 250.0 lb

## 2013-04-18 DIAGNOSIS — K529 Noninfective gastroenteritis and colitis, unspecified: Secondary | ICD-10-CM

## 2013-04-18 NOTE — Progress Notes (Signed)
Chief Complaint  Patient presents with  . Fever    Nausea and vomiting have stopped with the Zofran.  Started Wednesday morning.  Does not have an appetite.  She is tolerating fluids.  . Diarrhea  . Headache    HPI: Patient comes in today for SDA for  new problem evaluation. See phone note . Here with father. Acute onset vomiting 3 am  Then fever  And diarrhea watery white.  About every 20 miutes. Works at school .kindergarten. Mild abd pain som cramps.  gatorade gingeraid water.   Kept down and fluid in the last 10 hours .  Using zofran . ROS: See pertinent positives and negatives per HPI. Had and dizziness   no unusual rash h  No exposures no travel took ibuprofen  No cough   urinating normal  No hx c diff had amoxc 2 month ago .  Past Medical History  Diagnosis Date  . 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 Prematurity     "3 months" 1# 15 oz" ventilator   . 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 Thyroid disease Mother   . Hypertension Mother     History   Social History  . Marital Status: Single    Spouse Name: N/A    Number of Children: N/A  . 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 Smoking status: Never Smoker   . Smokeless tobacco: None  . Alcohol Use: Yes     Comment: socially  . Drug Use: No  . Sexual Activity: None   Other Topics Concern  . None   Social History Narrative   Theme park manager   Pet dog non smoker    Home for the summer.   Is working as a Systems developer third grade   Doing much better less stress    Outpatient Encounter Prescriptions as of  04/18/2013  Medication Sig  . ondansetron (ZOFRAN) 4 MG tablet Take 1 tablet (4 mg total) by mouth every 8 (eight) hours as needed for nausea or vomiting.  . triamcinolone (KENALOG) 0.1 % ointment Mix 1.1 with aquaphor to used bid to eczema  . [DISCONTINUED] amoxicillin (AMOXIL) 875 MG tablet Take 1 tablet (875 mg total) by mouth 2 (two) times daily.    EXAM:  BP 110/78  Pulse 103  Temp(Src) 98.6 F (37 C) (Oral)  Ht 5\' 1"  (1.549 m)  Wt 250 lb (113.399 kg)  BMI 47.26 kg/m2  SpO2 97%  Body mass index is 47.26 kg/(m^2). well-developed well-nourished mildly ill  Non toxic  GENERAL: vitals reviewed and listed above, alert, oriented, appears well hydrated and in no acute distress HEENT: atraumatic, conjunctiva  clear, no obvious abnormalities on inspection of external nose and ears OP : no lesion edema or exudate  NECK: no obvious masses on inspection palpation  LUNGS: clear to auscultation bilaterally, no wheezes, rales or rhonchi, good air movement CV: HRRR, no clubbing cyanosis or  peripheral edema nl cap refill  Abdomen:  Sof,t normal bowel sounds mild tenderness  without hepatosplenomegaly, no guarding rebound or masses no CVA tenderness MS: moves all extremities without noticeable focal  abnormality Skin: normal capillary refill ,turgor , color: No acute rashes ,petechiae or bruisinghealed eczema  PSYCH: pleasant and cooperative, no obvious depression or anxiety  ASSESSMENT AND PLAN:  Discussed the following assessment and plan:  Gastroenteritis presumed infectious With fever NVD  hudration acceptable    Expectant management. And plan low threshold to do stool testing if needed   -Patient advised to return or notify health care team  if symptoms worsen or persist or new concerns arise.  Patient Instructions  This is acute gatroeneteritis  Probably viral. Supportive care at this time. Use tylenol instead of ibuprofen for  Fever to avoid stomach irritation. Continue fluid  hydration.  Expect to improve in the next 72 hours or less but can have  Some sx for a weekor 2 . Contact us if fever persists  Blood in stool severe sx.  Viral Gastroenteritis Viral gastroenteritis is also known as stomach flu. This condition affects the stomach and intestinal tract. It can cause sudden diarrhea and vomiting. The illness typically lasts 3 to 8 days. Most people develop an immune response that eventually gets rid of the virus. While this natural response develops, the virus can make you quite ill. CAUSES  Many different viruses can cause gastroenteritis, such as rotavirus or noroviruses. You can catch one of these viruses by consuming contaminated food or water. You may also catch a virus by sharing utensils or other personal items with an infected person or by touching a contaminated surface. SYMPTOMS  The most common symptoms are diarrhea and vomiting. These problems can cause a severe loss of body fluids (dehydration) and a body salt (electrolyte) imbalance. Other symptoms may include:  Fever.  Headache.  Fatigue.  Abdominal pain. DIAGNOSIS  Your caregiver can usually diagnose viral gastroenteritis based on your symptoms and a physical exam. A stool sample may also be taken to test for the presence of viruses or other infections. TREATMENT  This illness typically goes away on its own. Treatments are aimed at rehydration. The most serious cases of viral gastroenteritis involve vomiting so severely that you are not able to keep fluids down. In these cases, fluids must be given through an intravenous line (IV). HOME CARE INSTRUCTIONS   Drink enough fluids to keep your urine clear or pale yellow. Drink small amounts of fluids frequently and increase the amounts as tolerated.  Ask your caregiver for specific rehydration instructions.  Avoid:  Foods high in sugar.  Alcohol.  Carbonated drinks.  Tobacco.  Juice.  Caffeine drinks.  Extremely hot or cold  fluids.  Fatty, greasy foods.  Too much intake of anything at one time.  Dairy products until 24 to 48 hours after diarrhea stops.  You may consume probiotics. Probiotics are active cultures of beneficial bacteria. They may lessen the amount and number of diarrheal stools in adults. Probiotics can be found in yogurt with active cultures and in supplements.  Wash your hands well to avoid spreading the virus.  Only take over-the-counter or prescription medicines for pain, discomfort, or fever as directed by your caregiver. Do not give aspirin to children. Antidiarrheal medicines are not recommended.  Ask your caregiver if you should continue to take your regular prescribed and over-the-counter medicines.  Keep all follow-up appointments as directed by your caregiver. SEEK IMMEDIATE MEDICAL CARE IF:   You are unable to keep fluids down.  You do not urinate at  least once every 6 to 8 hours.  You develop shortness of breath.  You notice blood in your stool or vomit. This may look like coffee grounds.  You have abdominal pain that increases or is concentrated in one small area (localized).  You have persistent vomiting or diarrhea.  You have a fever.  The patient is a child younger than 3 months, and he or she has a fever.  The patient is a child older than 3 months, and he or she has a fever and persistent symptoms.  The patient is a child older than 3 months, and he or she has a fever and symptoms suddenly get worse.  The patient is a baby, and he or she has no tears when crying. MAKE SURE YOU:   Understand these instructions.  Will watch your condition.  Will get help right away if you are not doing well or get worse. Document Released: 02/21/2005 Document Revised: 05/16/2011 Document Reviewed: 12/08/2010 [facility] Patient Information 2014 [place].      Standley Brooking. Panosh M.D.  Pre visit review using our clinic review tool, if applicable. No additional  management support is needed unless otherwise documented below in the visit note.

## 2013-04-18 NOTE — Patient Instructions (Signed)
This is acute gatroeneteritis  Probably viral. Supportive care at this time. Use tylenol instead of ibuprofen for  Fever to avoid stomach irritation. Continue fluid hydration.  Expect to improve in the next 72 hours or less but can have  Some sx for a weekor 2 . Contact us if fever persists  Blood in stool severe sx.  Viral Gastroenteritis Viral gastroenteritis is also known as stomach flu. This condition affects the stomach and intestinal tract. It can cause sudden diarrhea and vomiting. The illness typically lasts 3 to 8 days. Most people develop an immune response that eventually gets rid of the virus. While this natural response develops, the virus can make you quite ill. CAUSES  Many different viruses can cause gastroenteritis, such as rotavirus or noroviruses. You can catch one of these viruses by consuming contaminated food or water. You may also catch a virus by sharing utensils or other personal items with an infected person or by touching a contaminated surface. SYMPTOMS  The most common symptoms are diarrhea and vomiting. These problems can cause a severe loss of body fluids (dehydration) and a body salt (electrolyte) imbalance. Other symptoms may include:  Fever.  Headache.  Fatigue.  Abdominal pain. DIAGNOSIS  Your caregiver can usually diagnose viral gastroenteritis based on your symptoms and a physical exam. A stool sample may also be taken to test for the presence of viruses or other infections. TREATMENT  This illness typically goes away on its own. Treatments are aimed at rehydration. The most serious cases of viral gastroenteritis involve vomiting so severely that you are not able to keep fluids down. In these cases, fluids must be given through an intravenous line (IV). HOME CARE INSTRUCTIONS   Drink enough fluids to keep your urine clear or pale yellow. Drink small amounts of fluids frequently and increase the amounts as tolerated.  Ask your caregiver for specific  rehydration instructions.  Avoid:  Foods high in sugar.  Alcohol.  Carbonated drinks.  Tobacco.  Juice.  Caffeine drinks.  Extremely hot or cold fluids.  Fatty, greasy foods.  Too much intake of anything at one time.  Dairy products until 24 to 48 hours after diarrhea stops.  You may consume probiotics. Probiotics are active cultures of beneficial bacteria. They may lessen the amount and number of diarrheal stools in adults. Probiotics can be found in yogurt with active cultures and in supplements.  Wash your hands well to avoid spreading the virus.  Only take over-the-counter or prescription medicines for pain, discomfort, or fever as directed by your caregiver. Do not give aspirin to children. Antidiarrheal medicines are not recommended.  Ask your caregiver if you should continue to take your regular prescribed and over-the-counter medicines.  Keep all follow-up appointments as directed by your caregiver. SEEK IMMEDIATE MEDICAL CARE IF:   You are unable to keep fluids down.  You do not urinate at least once every 6 to 8 hours.  You develop shortness of breath.  You notice blood in your stool or vomit. This may look like coffee grounds.  You have abdominal pain that increases or is concentrated in one small area (localized).  You have persistent vomiting or diarrhea.  You have a fever.  The patient is a child younger than 3 months, and he or she has a fever.  The patient is a child older than 3 months, and he or she has a fever and persistent symptoms.  The patient is a child older than 3 months, and he or  she has a fever and symptoms suddenly get worse.  The patient is a baby, and he or she has no tears when crying. MAKE SURE YOU:   Understand these instructions.  Will watch your condition.  Will get help right away if you are not doing well or get worse. Document Released: 02/21/2005 Document Revised: 05/16/2011 Document Reviewed:  12/08/2010 [facility] Patient Information 2014 [place].

## 2013-07-19 ENCOUNTER — Ambulatory Visit (INDEPENDENT_AMBULATORY_CARE_PROVIDER_SITE_OTHER): Payer: BC Managed Care – PPO | Admitting: Nurse Practitioner

## 2013-07-19 ENCOUNTER — Encounter: Payer: Self-pay | Admitting: Nurse Practitioner

## 2013-07-19 VITALS — BP 116/69 | HR 124 | Temp 100.8°F | Ht 61.0 in | Wt 255.0 lb

## 2013-07-19 DIAGNOSIS — J020 Streptococcal pharyngitis: Secondary | ICD-10-CM | POA: Insufficient documentation

## 2013-07-19 MED ORDER — AMOXICILLIN-POT CLAVULANATE 875-125 MG PO TABS: 1.0000 | ORAL_TABLET | Freq: Two times a day (BID) | ORAL | Status: DC

## 2013-07-19 NOTE — Patient Instructions (Signed)
Start antibiotic. Eat yogurt at lunch time to avoid diarrhea associated with antibiotic use. Gargle with salt water several times daily (1/4 tsp salt mixed with 1/4 cup warm water). Gargle with listerene several times daily. Use benzocaine throat lozenges for comfort. Rinse sinuses daily for 10 days with Neilmed sinus rinse. Change tooth brush 24 hours after you start antibiotic.  Strep Throat Strep throat is an infection of the throat caused by a bacteria named Streptococcus pyogenes. Your caregiver may call the infection streptococcal "tonsillitis" or "pharyngitis" depending on whether there are signs of inflammation in the tonsils or back of the throat. Strep throat is most common in children aged 5 15 years during the cold months of the year, but it can occur in people of any age during any season. This infection is spread from person to person (contagious) through coughing, sneezing, or other close contact. SYMPTOMS   Fever or chills.  Painful, swollen, red tonsils or throat.  Pain or difficulty when swallowing.  White or yellow spots on the tonsils or throat.  Swollen, tender lymph nodes or "glands" of the neck or under the jaw.  Red rash all over the body (rare). DIAGNOSIS  Many different infections can cause the same symptoms. A test must be done to confirm the diagnosis so the right treatment can be given. A "rapid strep test" can help your caregiver make the diagnosis in a few minutes. If this test is not available, a light swab of the infected area can be used for a throat culture test. If a throat culture test is done, results are usually available in a day or two. TREATMENT  Strep throat is treated with antibiotic medicine. HOME CARE INSTRUCTIONS   Gargle with 1 tsp of salt in 1 cup of warm water, 3 4 times per day or as needed for comfort.  Family members who also have a sore throat or fever should be tested for strep throat and treated with antibiotics if they have the strep  infection.  Make sure everyone in your household washes their hands well.  Do not share food, drinking cups, or personal items that could cause the infection to spread to others.  You may need to eat a soft food diet until your sore throat gets better.  Drink enough water and fluids to keep your urine clear or pale yellow. This will help prevent dehydration.  Get plenty of rest.  Stay home from school, daycare, or work until you have been on antibiotics for 24 hours.  Only take over-the-counter or prescription medicines for pain, discomfort, or fever as directed by your caregiver.  If antibiotics are prescribed, take them as directed. Finish them even if you start to feel better. SEEK MEDICAL CARE IF:   The glands in your neck continue to enlarge.  You develop a rash, cough, or earache.  You cough up green, yellow-brown, or bloody sputum.  You have pain or discomfort not controlled by medicines.  Your problems seem to be getting worse rather than better. SEEK IMMEDIATE MEDICAL CARE IF:   You develop any new symptoms such as vomiting, severe headache, stiff or painful neck, chest pain, shortness of breath, or trouble swallowing.  You develop severe throat pain, drooling, or changes in your voice.  You develop swelling of the neck, or the skin on the neck becomes red and tender.  You have a fever.  You develop signs of dehydration, such as fatigue, dry mouth, and decreased urination.  You become increasingly sleepy,  or you cannot wake up completely. Document Released: 02/19/2000 Document Revised: 02/08/2012 Document Reviewed: 04/22/2010 [facility] Patient Information 2014 [place], [place].

## 2013-07-19 NOTE — Progress Notes (Signed)
Pre visit review using our clinic review tool, if applicable. No additional management support is needed unless otherwise documented below in the visit note. 

## 2013-07-19 NOTE — Progress Notes (Signed)
   Subjective:    Patient ID: Tiffany Roth, female    DOB: Jun 03, 1990, 23 y.o.   MRN: [id number]  Headache  This is a chronic problem. The current episode started 1 to 4 weeks ago (3wks). The problem occurs constantly. The problem has been unchanged. The pain is located in the bilateral and frontal region. The pain does not radiate. The pain quality is not similar to prior headaches. The quality of the pain is described as aching. The pain is moderate. Associated symptoms include drainage, a fever and a sore throat. Pertinent negatives include no abdominal pain, back pain, blurred vision, coughing, eye pain, eye redness, muscle aches, nausea, sinus pressure, visual change, vomiting or weakness. Nothing aggravates the symptoms. She has tried NSAIDs for the symptoms. The treatment provided mild relief.  Sore Throat  Associated symptoms include congestion and headaches. Pertinent negatives include no abdominal pain, coughing or vomiting.      Review of Systems  Constitutional: Positive for fever and fatigue. Negative for chills, activity change and appetite change.  HENT: Positive for congestion and sore throat. Negative for sinus pressure.   Eyes: Negative for blurred vision, pain and redness.  Respiratory: Negative for cough.   Gastrointestinal: Negative for nausea, vomiting and abdominal pain.  Musculoskeletal: Negative for back pain.  Neurological: Positive for headaches. Negative for weakness.       Objective:   Physical Exam  Vitals reviewed. Constitutional: She is oriented to person, place, and time. She appears well-developed and well-nourished. No distress.  HENT:  Head: Normocephalic and atraumatic.  Right Ear: External ear normal.  Left Ear: External ear normal.  Mouth/Throat: Oropharyngeal exudate present.  Tonsils +3 bilat, w/exudate  Eyes: Conjunctivae are normal. Right eye exhibits no discharge. Left eye exhibits no discharge.  Neck: Normal range of motion. Neck  supple. No thyromegaly present.  Cardiovascular: Normal rate, regular rhythm and normal heart sounds.   No murmur heard. Pulmonary/Chest: Effort normal and breath sounds normal. No respiratory distress. She has no wheezes. She has no rales.  Lymphadenopathy:    She has no cervical adenopathy.  Neurological: She is alert and oriented to person, place, and time.  Skin: Skin is warm and dry.  Psychiatric: She has a normal mood and affect. Her behavior is normal. Thought content normal.          Assessment & Plan:  1. Strep pharyngitis - amoxicillin-clavulanate (AUGMENTIN) 875-125 MG per tablet; Take 1 tablet by mouth 2 (two) times daily.  Dispense: 14 tablet; Refill: 0 See pt instructions.

## 2013-08-30 ENCOUNTER — Telehealth: Payer: Self-pay | Admitting: Internal Medicine

## 2013-08-30 NOTE — Telephone Encounter (Signed)
Pt following up on paperwork dropped off mon. Pt is applying for graduate school and cannot register for classes until she get info back . pls advise

## 2013-08-30 NOTE — Telephone Encounter (Signed)
Patient has not had a physical.  Please schedule the pt ASAP.  Thanks!

## 2013-08-30 NOTE — Telephone Encounter (Signed)
Pt aware she needs cpe prior to having her paperwork filled out. Pt is sch  For appt on tues, 6/30 at 11:15 am and will come in fasting for labs.

## 2013-08-30 NOTE — Telephone Encounter (Signed)
Noted  

## 2013-09-03 ENCOUNTER — Other Ambulatory Visit (HOSPITAL_COMMUNITY)
Admission: RE | Admit: 2013-09-03 | Discharge: 2013-09-03 | Disposition: A | Discharge status: Home / Self Care | Payer: BC Managed Care – PPO | Source: Ambulatory Visit | Attending: Internal Medicine | Admitting: Internal Medicine

## 2013-09-03 ENCOUNTER — Encounter: Payer: Self-pay | Admitting: Internal Medicine

## 2013-09-03 ENCOUNTER — Ambulatory Visit (INDEPENDENT_AMBULATORY_CARE_PROVIDER_SITE_OTHER): Payer: BC Managed Care – PPO | Admitting: Internal Medicine

## 2013-09-03 VITALS — BP 120/82 | Temp 98.8°F | Ht 62.0 in | Wt 262.0 lb

## 2013-09-03 DIAGNOSIS — Z01419 Encounter for gynecological examination (general) (routine) without abnormal findings: Secondary | ICD-10-CM | POA: Insufficient documentation

## 2013-09-03 DIAGNOSIS — Z6841 Body Mass Index (BMI) 40.0 and over, adult: Secondary | ICD-10-CM

## 2013-09-03 DIAGNOSIS — Z114 Encounter for screening for human immunodeficiency virus [HIV]: Secondary | ICD-10-CM

## 2013-09-03 DIAGNOSIS — I868 Varicose veins of other specified sites: Secondary | ICD-10-CM

## 2013-09-03 DIAGNOSIS — Z0000 Encounter for general adult medical examination without abnormal findings: Secondary | ICD-10-CM

## 2013-09-03 DIAGNOSIS — L259 Unspecified contact dermatitis, unspecified cause: Secondary | ICD-10-CM

## 2013-09-03 DIAGNOSIS — Z1159 Encounter for screening for other viral diseases: Secondary | ICD-10-CM

## 2013-09-03 LAB — CBC WITH DIFFERENTIAL/PLATELET
BASOS ABS: 0 10*3/uL (ref 0.0–0.1)
Basophils Relative: 0.4 % (ref 0.0–3.0)
EOS PCT: 1.4 % (ref 0.0–5.0)
Eosinophils Absolute: 0.1 10*3/uL (ref 0.0–0.7)
HEMATOCRIT: 41.9 % (ref 36.0–46.0)
HEMOGLOBIN: 14 g/dL (ref 12.0–15.0)
LYMPHS ABS: 3.6 10*3/uL (ref 0.7–4.0)
Lymphocytes Relative: 34 % (ref 12.0–46.0)
MCHC: 33.4 g/dL (ref 30.0–36.0)
MCV: 89.2 fl (ref 78.0–100.0)
MONO ABS: 1 10*3/uL (ref 0.1–1.0)
Monocytes Relative: 9 % (ref 3.0–12.0)
NEUTROS ABS: 5.8 10*3/uL (ref 1.4–7.7)
Neutrophils Relative %: 55.2 % (ref 43.0–77.0)
PLATELETS: 323 10*3/uL (ref 150.0–400.0)
RBC: 4.7 Mil/uL (ref 3.87–5.11)
RDW: 13.9 % (ref 11.5–15.5)
WBC: 10.6 10*3/uL — ABNORMAL HIGH (ref 4.0–10.5)

## 2013-09-03 LAB — LIPID PANEL
CHOLESTEROL: 123 mg/dL (ref 0–200)
HDL: 27.5 mg/dL — ABNORMAL LOW (ref >39.00)
LDL CALC: 75 mg/dL (ref 0–99)
NonHDL: 95.5
TRIGLYCERIDES: 102 mg/dL (ref 0.0–149.0)
Total CHOL/HDL Ratio: 4
VLDL: 20.4 mg/dL (ref 0.0–40.0)

## 2013-09-03 LAB — BASIC METABOLIC PANEL
BUN: 13 mg/dL (ref 6–23)
CHLORIDE: 107 meq/L (ref 96–112)
CO2: 27 mEq/L (ref 19–32)
Calcium: 9.2 mg/dL (ref 8.4–10.5)
Creatinine, Ser: 1.3 mg/dL — ABNORMAL HIGH (ref 0.4–1.2)
GFR: 66.56 mL/min (ref >60.00)
GLUCOSE: 74 mg/dL (ref 70–99)
POTASSIUM: 3.6 meq/L (ref 3.5–5.1)
SODIUM: 140 meq/L (ref 135–145)

## 2013-09-03 LAB — HEPATIC FUNCTION PANEL
ALBUMIN: 3.3 g/dL — AB (ref 3.5–5.2)
ALT: 65 U/L — AB (ref 0–35)
AST: 42 U/L — AB (ref 0–37)
Alkaline Phosphatase: 103 U/L (ref 39–117)
BILIRUBIN TOTAL: 0.9 mg/dL (ref 0.2–1.2)
Bilirubin, Direct: 0.1 mg/dL (ref 0.0–0.3)
Total Protein: 7.5 g/dL (ref 6.0–8.3)

## 2013-09-03 LAB — T4, FREE: FREE T4: 1.16 ng/dL (ref 0.60–1.60)

## 2013-09-03 LAB — TSH: TSH: 1.68 u[IU]/mL (ref 0.35–4.50)

## 2013-09-03 NOTE — Patient Instructions (Addendum)
150 minutes of exercise weeks  ,  Lose weight  To healthy levels. Avoid trans fats and processed foods;  Increase fresh fruits and veges to 5 servings per day. And avoid sweet beverages  Including tea and juice. Will notify you  of labs/PAP when available. Let us know how we can help.    Exercise to Lose Weight Exercise and a healthy diet may help you lose weight. Your doctor may suggest specific exercises. EXERCISE IDEAS AND TIPS  Choose low-cost things you enjoy doing, such as walking, bicycling, or exercising to workout videos.  Take stairs instead of the elevator.  Walk during your lunch break.  Park your car further away from work or school.  Go to a gym or an exercise class.  Start with 5 to 10 minutes of exercise each day. Build up to 30 minutes of exercise 4 to 6 days a week.  Wear shoes with good support and comfortable clothes.  Stretch before and after working out.  Work out until you breathe harder and your heart beats faster.  Drink extra water when you exercise.  Do not do so much that you hurt yourself, feel dizzy, or get very short of breath. Exercises that burn about 150 calories:  Running 1  miles in 15 minutes.  Playing volleyball for 45 to 60 minutes.  Washing and waxing a car for 45 to 60 minutes.  Playing touch football for 45 minutes.  Walking 1  miles in 35 minutes.  Pushing a stroller 1  miles in 30 minutes.  Playing basketball for 30 minutes.  Raking leaves for 30 minutes.  Bicycling 5 miles in 30 minutes.  Walking 2 miles in 30 minutes.  Dancing for 30 minutes.  Shoveling snow for 15 minutes.  Swimming laps for 20 minutes.  Walking up stairs for 15 minutes.  Bicycling 4 miles in 15 minutes.  Gardening for 30 to 45 minutes.  Jumping rope for 15 minutes.  Washing windows or floors for 45 to 60 minutes. Document Released: 03/26/2010 Document Revised: 05/16/2011 Document Reviewed: 03/26/2010 [facility] Patient Information  2015 [place], [place]. This information is not intended to replace advice given to you by your health care provider. Make sure you discuss any questions you have with your health care provider.

## 2013-09-03 NOTE — Progress Notes (Signed)
Pre visit review using our clinic review tool, if applicable. No additional management support is needed unless otherwise documented below in the visit note.  Chief Complaint  Patient presents with  . 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  Seen may 15 for exudative tonsillitis rx for strep with augmentin Strep Periods last 3-5 days about 1 per 3-4 weeks  Skin sees derm now controlling bad eczema  Health Maintenance  Topic Date Due  . Pap Smear  05/05/2012  . Influenza Vaccine  10/05/2013  . Tetanus/tdap  10/02/2016   Health Maintenance Review  ROS: gets some swelling in andkles at times  ? If diuretic would help. GEN/ HEENT: No fever, significant weight changes sweats headaches vision problems hearing changes, CV/ PULM; No chest pain shortness of breath cough, syncope,edema  change in exercise tolerance. GI /GU: No adominal pain, vomiting, change in bowel habits. No blood in the stool. No significant GU symptoms. SKIN/HEME: , eczema now rx by derm has topicals n rx for infection as needed  no suspicious lesions or bleeding. No lymphadenopathy, nodules, masses.  NEURO/ PSYCH:  No neurologic signs such as weakness numbness. No depression anxiety. IMM/ Allergy: No unusual infections.  Allergy .   REST of 12 system review negative except as per HPI  Past Medical History  Diagnosis Date  . 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 Prematurity     "3 months" 1# 15 oz" ventilator   . 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 Thyroid disease Mother   . Hypertension Mother      History   Social History  . Marital Status: Single    Spouse Name: N/A    Number of Children: N/A  . 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 Smoking status: Never Smoker   . Smokeless tobacco: None  . Alcohol Use: Yes     Comment: socially  . Drug Use: No  . Sexual Activity: None   Other Topics Concern  . 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    Doing much better less stress   Going to grad school in august .  [place] in [employer] education .     Outpatient Encounter Prescriptions as of 09/03/2013  Medication Sig  . [DISCONTINUED] amoxicillin-clavulanate (AUGMENTIN) 875-125 MG per tablet Take 1 tablet by mouth 2 (two) times daily.    EXAM:  BP 120/82  Temp(Src) 98.8 F (37.1 C) (Oral)  Ht 5\' 2"  (1.575 m)  Wt 262 lb (118.842 kg)  BMI 47.91 kg/m2  LMP 08/20/2013  Body mass index is 47.91 kg/(m^2).  Physical Exam: Vital signs reviewed SWF:UXNA is a well-developed well-nourished alert cooperative    who appearsr  stated age in no acute distress.  Quiescent eczwma HEENT: normocephalic atraumatic , Eyes: PERRL EOM's full,  Wears glasses conjunctiva clear, Nares: paten,t no deformity discharge or tenderness., Ears: no deformity EAC's clear TMs with normal landmarks. Mouth: clear OP, no lesions, edema.  Moist mucous membranes. Dentition in adequate repair. NECK: supple without masses, thyromegaly or bruits. CHEST/PULM:  Clear to auscultation and percussion breath sounds equal no wheeze , rales or rhonchi. No chest wall deformities or tenderness. Breast: normal by inspection . No dimpling, discharge, masses, tenderness or discharge . CV: PMI is nondisplaced, S1 S2 no gallops, murmurs, rubs. Peripheral pulses are full without delay.No JVD .   ABDOMEN: Bowel sounds normal nontender  No guard or rebound, no hepato splenomegal no CVA tenderness.  No hernia. Extremtities:  No clubbing cyanosis  chormic edema ankles  Sig varicosities  No ulcers  no acute joint swelling or redness no focal atrophy NEURO:  Oriented x3, cranial nerves 3-12 appear to be intact, no obvious focal weakness,gait within normal limits no abnormal reflexes or asymmetrical SKIN:  Faded [pigment changes from eczema no acute infection abrasion right finger trauma today no burising, color, no bruising or petechiae. PSYCH: Oriented, good eye contact, no obvious depression anxiety, cognition and judgment appear normal. LN: no cervical axillary inguinal adenopathy Pelvic: NL ext GU, labia clear without lesions or rash . Vagina no lesions .Cervix: clear  UTERUS: Neg CMT Adnexa:  clear no masses . PAP done   Lab Results  Component Value Date   WBC 8.4 11/25/2011   HGB 14.2 11/25/2011   HCT 43.1 11/25/2011   PLT 296 11/25/2011   GLUCOSE 76 12/09/2011   CHOL 159 11/25/2011   TRIG 190* 11/25/2011   HDL 34* 11/25/2011   LDLCALC 87 11/25/2011   ALT 19 11/25/2011   AST 24 11/25/2011   NA 143 12/09/2011   K 4.1 12/09/2011   CL 108 12/09/2011   CREATININE 1.03 12/09/2011   BUN 8 12/09/2011   CO2 28 12/09/2011   TSH 2.625 11/25/2011    ASSESSMENT AND PLAN:  Discussed the following assessment and plan:  Visit for preventive health examination - Plan: PAP [[place]], Basic metabolic panel, CBC with Differential, Hepatic function panel, Lipid panel, TSH, T4, free, HIV antibody  BMI 40.0-44.9, adult - Plan: Basic metabolic panel, CBC with Differential, Hepatic function panel, Lipid panel, TSH, T4, free, HIV antibody  Encounter for routine gynecological examination - Plan: PAP [], Basic metabolic panel, CBC with Differential, Hepatic function panel, Lipid panel, TSH, T4, free, HIV antibody  Screening for HIV without presence of risk factors - Plan: Basic metabolic panel,  CBC with Differential, Hepatic function panel, Lipid panel, TSH, T4, free, HIV antibody  ECZEMA - Plan: Basic metabolic panel, CBC with Differential, Hepatic function panel, Lipid panel, TSH, T4, free, HIV antibody  VARICOSE VEIN - Plan: Basic metabolic panel, CBC with Differential, Hepatic function panel, Lipid panel, TSH, T4, free, HIV antibody Would not advise a diuretic at this time  VV summer and  Obesity contributing  Labs pending but if getting worse contact for fu visit  Counseled regarding healthy nutrition, exercise, sleep, injury prevention, calcium vit d and healthy weight . Form competed and signed no restrictions  Patient Care Team: Burnis Medin, MD as PCP - General Patient Instructions  150 minutes of exercise weeks  ,  Lose weight  To healthy levels. Avoid trans fats and processed foods;  Increase fresh fruits and veges to 5 servings per  day. And avoid sweet beverages  Including tea and juice. Will notify you  of labs/PAP when available. Let us know how we can help.    Exercise to Lose Weight Exercise and a healthy diet may help you lose weight. Your doctor may suggest specific exercises. EXERCISE IDEAS AND TIPS  Choose low-cost things you enjoy doing, such as walking, bicycling, or exercising to workout videos.  Take stairs instead of the elevator.  Walk during your lunch break.  Park your car further away from work or school.  Go to a gym or an exercise class.  Start with 5 to 10 minutes of exercise each day. Build up to 30 minutes of exercise 4 to 6 days a week.  Wear shoes with good support and comfortable clothes.  Stretch before and after working out.  Work out until you breathe harder and your heart beats faster.  Drink extra water when you exercise.  Do not do so much that you hurt yourself, feel dizzy, or get very short of breath. Exercises that burn about 150 calories:  Running 1  miles in 15 minutes.  Playing volleyball for 45 to 60  minutes.  Washing and waxing a car for 45 to 60 minutes.  Playing touch football for 45 minutes.  Walking 1  miles in 35 minutes.  Pushing a stroller 1  miles in 30 minutes.  Playing basketball for 30 minutes.  Raking leaves for 30 minutes.  Bicycling 5 miles in 30 minutes.  Walking 2 miles in 30 minutes.  Dancing for 30 minutes.  Shoveling snow for 15 minutes.  Swimming laps for 20 minutes.  Walking up stairs for 15 minutes.  Bicycling 4 miles in 15 minutes.  Gardening for 30 to 45 minutes.  Jumping rope for 15 minutes.  Washing windows or floors for 45 to 60 minutes. Document Released: 03/26/2010 Document Revised: 05/16/2011 Document Reviewed: 03/26/2010 [facility] Patient Information 2015 [place], [place]. This information is not intended to replace advice given to you by your health care provider. Make sure you discuss any questions you have with your health care provider.   Standley Brooking. Panosh M.D.

## 2013-09-04 LAB — HIV ANTIBODY (ROUTINE TESTING W REFLEX): HIV: NONREACTIVE

## 2013-09-05 ENCOUNTER — Other Ambulatory Visit: Payer: Self-pay | Admitting: *Deleted

## 2013-09-05 DIAGNOSIS — R945 Abnormal results of liver function studies: Secondary | ICD-10-CM

## 2013-09-05 DIAGNOSIS — R7989 Other specified abnormal findings of blood chemistry: Secondary | ICD-10-CM

## 2013-09-09 LAB — CYTOLOGY - PAP

## 2013-09-09 NOTE — Progress Notes (Signed)
Quick Note:  Tell patient PAP is normal. ______ 

## 2013-09-11 ENCOUNTER — Encounter: Payer: Self-pay | Admitting: Family Medicine

## 2013-09-11 ENCOUNTER — Telehealth: Payer: Self-pay | Admitting: Internal Medicine

## 2013-09-11 NOTE — Telephone Encounter (Signed)
Patient notified of normal results.

## 2013-09-11 NOTE — Telephone Encounter (Signed)
Pt is requesting a call back from you regarding a message she received on her my chart that she does not understand, regarding cycology/pap.

## 2013-09-26 ENCOUNTER — Ambulatory Visit: Payer: BC Managed Care – PPO | Admitting: Pulmonary Disease

## 2013-09-27 ENCOUNTER — Other Ambulatory Visit (INDEPENDENT_AMBULATORY_CARE_PROVIDER_SITE_OTHER): Payer: BC Managed Care – PPO

## 2013-09-27 DIAGNOSIS — R7989 Other specified abnormal findings of blood chemistry: Secondary | ICD-10-CM

## 2013-09-27 DIAGNOSIS — R945 Abnormal results of liver function studies: Secondary | ICD-10-CM

## 2013-09-27 DIAGNOSIS — R799 Abnormal finding of blood chemistry, unspecified: Secondary | ICD-10-CM

## 2013-09-27 LAB — BASIC METABOLIC PANEL
BUN: 19 mg/dL (ref 6–23)
CO2: 23 mEq/L (ref 19–32)
Calcium: 9 mg/dL (ref 8.4–10.5)
Chloride: 110 mEq/L (ref 96–112)
Creatinine, Ser: 1.1 mg/dL (ref 0.4–1.2)
GFR: 80.34 mL/min (ref >60.00)
Glucose, Bld: 91 mg/dL (ref 70–99)
Potassium: 3.9 mEq/L (ref 3.5–5.1)
Sodium: 139 mEq/L (ref 135–145)

## 2013-09-27 LAB — HEPATIC FUNCTION PANEL
ALT: 25 U/L (ref 0–35)
AST: 25 U/L (ref 0–37)
Albumin: 3.4 g/dL — ABNORMAL LOW (ref 3.5–5.2)
Alkaline Phosphatase: 122 U/L — ABNORMAL HIGH (ref 39–117)
Bilirubin, Direct: 0 mg/dL (ref 0.0–0.3)
Total Bilirubin: 0.8 mg/dL (ref 0.2–1.2)
Total Protein: 7.1 g/dL (ref 6.0–8.3)

## 2013-09-28 LAB — PROTEIN / CREATININE RATIO, URINE
Creatinine, Urine: 18.6 mg/dL
Total Protein, Urine: <3 mg/dL

## 2013-10-01 ENCOUNTER — Ambulatory Visit (INDEPENDENT_AMBULATORY_CARE_PROVIDER_SITE_OTHER): Payer: BC Managed Care – PPO | Admitting: Internal Medicine

## 2013-10-01 ENCOUNTER — Encounter: Payer: Self-pay | Admitting: Internal Medicine

## 2013-10-01 VITALS — BP 120/82 | Temp 99.0°F | Ht 62.0 in | Wt 261.0 lb

## 2013-10-01 DIAGNOSIS — R7989 Other specified abnormal findings of blood chemistry: Secondary | ICD-10-CM

## 2013-10-01 DIAGNOSIS — R945 Abnormal results of liver function studies: Secondary | ICD-10-CM

## 2013-10-01 DIAGNOSIS — R799 Abnormal finding of blood chemistry, unspecified: Secondary | ICD-10-CM

## 2013-10-01 DIAGNOSIS — Z6841 Body Mass Index (BMI) 40.0 and over, adult: Secondary | ICD-10-CM

## 2013-10-01 DIAGNOSIS — L239 Allergic contact dermatitis, unspecified cause: Secondary | ICD-10-CM

## 2013-10-01 DIAGNOSIS — L259 Unspecified contact dermatitis, unspecified cause: Secondary | ICD-10-CM

## 2013-10-01 NOTE — Patient Instructions (Addendum)
Labs are better   will continue to monitor. Continue weight control efforts  Will do bariatric surgery referral. Plan labs in about 3-4 months  To be sure kidneys and liver are  Stable.   Try "baby" eucerin for moisturizing  Also

## 2013-10-01 NOTE — Progress Notes (Signed)
Pre visit review using our clinic review tool, if applicable. No additional management support is needed unless otherwise documented below in the visit note.  Chief Complaint  Patient presents with  . Follow-up    HPI: Patient comes in today for followup of abnormal lab tests. As requested.  Her transaminases were elevated on her CPX labs as well as her creatinine to 1.3. Since that time she is on no new medications no fever. Avoids anti-inflammatories. At the time of the labs she had been treated with medicines antibiotics for strep throat and some respiratory infection.  Her eczema is stable but still flares. Was put on prednisone for this  She is still struggling with her weight.  Asking about other interventions. May be interested in bariatric surgery approaches. ROS: See pertinent positives and negatives per HPI. No current chest pain shortness of breath.  Past Medical History  Diagnosis Date  . 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 Prematurity     "3 months" 1# 15 oz" ventilator   . 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 Thyroid disease Mother   . Hypertension Mother     History   Social History  . Marital Status: Single    Spouse Name: N/A    Number of Children: N/A  . 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 Smoking status: Never Smoker   . Smokeless tobacco: None  . Alcohol Use: Yes     Comment: socially  . Drug Use: No  . Sexual Activity: None   Other Topics Concern  . 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    Doing much better less stress   Going to grad school in august .  [place] in [employer] education .     No outpatient encounter prescriptions on file as of 10/01/2013.    EXAM:  BP 120/82  Temp(Src) 99 F (37.2 C)  Ht 5' 2" (1.575 m)  Wt 261 lb (118.389 kg)  BMI 47.73 kg/m2  LMP 08/20/2013  Body mass index is 47.73 kg/(m^2).  GENERAL: vitals reviewed and listed above, alert, oriented, appears well hydrated and in no acute distress HEENT: atraumatic, conjunctiva  clear, no obvious abnormalities on inspection of external nose and ears  NECK: no obvious masses on inspection palpation  Skin shows stated eczema some red patchy areas on elbows. MS: moves all extremities without noticeable focal  abnormality PSYCH: pleasant and cooperative, no obvious depression or anxiety  ASSESSMENT AND PLAN:  Discussed the following assessment and plan:  Abnormal LFTs - TRANSAMINASES BETTER poss from illness alk phos sligh elevated follow up nextvisit   BMI 40.0-44.9, adult  Allergic dermatitis  Elevated serum creatinine - better  follow bp ok today lfts better  Uncertain pattern  Nl [place] 2012  Cr  Better  No proteinuria by screen  Reviewed labs with patient we'll  follow. Discuss possibility of bariatric intervention important to have appropriate dietary and exercise intact. -Patient advised to return or notify health care team  if symptoms worsen ,persist or new concerns arise.  Patient Instructions  Labs are better   will continue to monitor. Continue weight control efforts  Will do bariatric surgery referral. Plan labs in about 3-4 months  To be sure kidneys and liver are  Stable.   Try "baby" eucerin for moisturizing  Also    Standley Brooking. Panosh M.D.

## 2013-10-25 ENCOUNTER — Encounter: Payer: Self-pay | Admitting: Pulmonary Disease

## 2013-10-25 ENCOUNTER — Ambulatory Visit (INDEPENDENT_AMBULATORY_CARE_PROVIDER_SITE_OTHER): Payer: BC Managed Care – PPO | Admitting: Pulmonary Disease

## 2013-10-25 VITALS — BP 140/92 | HR 101 | Temp 98.6°F | Ht 62.0 in | Wt 264.6 lb

## 2013-10-25 DIAGNOSIS — G4733 Obstructive sleep apnea (adult) (pediatric): Secondary | ICD-10-CM

## 2013-10-25 NOTE — Assessment & Plan Note (Signed)
The patient has a history of mild obstructive sleep apnea, and had a very good response to CPAP.  However, she had poor tolerance to her nasal pillows, and could not afford to change to a different mask at the time. She has therefore discontinued her CPAP but saw worsening of her sleep with snoring and witnessed apneas. She also has significant daytime sleepiness. She wishes to start back on CPAP, and I am in agreement with this. I will send an order to her home care company to arrange this.

## 2013-10-25 NOTE — Patient Instructions (Signed)
Will start back on cpap with moderate pressure, and will arrange to be fitted with full face mask that will allow mouth opening. Work on weight loss followup with me again in 8 weeks.

## 2013-10-25 NOTE — Progress Notes (Signed)
   Subjective:    Patient ID: Tiffany Roth, female    DOB: [birth year], 23 y.o.   MRN: [id number]  HPI The patient comes in today for followup of her obstructive sleep apnea. She has not been seen and it least a year, and discontinued her CPAP because she cannot afford a mask change.  She has gained 28 pounds since last visit, and has seen some a significant increase in her symptoms. She still has snoring, witnessed apnea, and nonrestorative sleep with daytime sleepiness. She is wanting to be restarted on CPAP.   Review of Systems  Constitutional: Negative for fever and unexpected weight change.  HENT: Negative for congestion, dental problem, ear pain, nosebleeds, postnasal drip, rhinorrhea, sinus pressure, sneezing, sore throat and trouble swallowing.   Eyes: Negative for redness and itching.  Respiratory: Negative for cough, chest tightness, shortness of breath and wheezing.   Cardiovascular: Negative for palpitations and leg swelling.  Gastrointestinal: Negative for nausea and vomiting.  Genitourinary: Negative for dysuria.  Musculoskeletal: Negative for joint swelling.  Skin: Negative for rash.  Neurological: Negative for headaches.  Hematological: Does not bruise/bleed easily.  Psychiatric/Behavioral: Negative for dysphoric mood. The patient is not nervous/anxious.        Objective:   Physical Exam Morbidly obese female in no acute distress Nose without purulence or discharge noted Neck without lymphadenopathy or thyromegaly Lower extremities with edema noted, no cyanosis Awake, but appears sleepy, moves all 4 extremities.       Assessment & Plan:

## 2013-12-20 ENCOUNTER — Ambulatory Visit: Payer: BC Managed Care – PPO | Admitting: Pulmonary Disease

## 2013-12-27 ENCOUNTER — Ambulatory Visit: Payer: BC Managed Care – PPO | Admitting: Pulmonary Disease

## 2014-01-10 ENCOUNTER — Encounter: Payer: Self-pay | Admitting: Pulmonary Disease

## 2014-01-10 ENCOUNTER — Ambulatory Visit (INDEPENDENT_AMBULATORY_CARE_PROVIDER_SITE_OTHER): Payer: BC Managed Care – PPO | Admitting: Pulmonary Disease

## 2014-01-10 VITALS — BP 124/70 | HR 103 | Temp 97.6°F | Ht 61.0 in | Wt 259.4 lb

## 2014-01-10 DIAGNOSIS — G4733 Obstructive sleep apnea (adult) (pediatric): Secondary | ICD-10-CM

## 2014-01-10 NOTE — Patient Instructions (Signed)
Continue on cpap, and keep up with mask changes and supplies. Work on weight loss followup with me again in 6mos.  

## 2014-01-10 NOTE — Assessment & Plan Note (Signed)
The patient is doing very well from a sleep apnea standpoint on her current C Pap set up. Her download shows excellent compliance, and good control of her AHI. She has seen improvement in her sleep and daytime alertness. I have asked her to continue on her C Pap, and to work aggressively on weight loss.

## 2014-01-10 NOTE — Progress Notes (Signed)
   Subjective:    Patient ID: Tiffany Roth, female    DOB: 1990-09-21, 23 y.o.   MRN: [id number]  HPI Patient comes in today for follow-up of her obstructive sleep apnea. She is wearing CPAP compliantly, and is having no issues with her mask fit or pressure. Her download shows excellent compliance, and good control of her AHI. She has seen great improvement in her sleep and her daytime focus.   Review of Systems  Constitutional: Negative for fever and unexpected weight change.  HENT: Negative for congestion, dental problem, ear pain, nosebleeds, postnasal drip, rhinorrhea, sinus pressure, sneezing, sore throat and trouble swallowing.   Eyes: Negative for redness and itching.  Respiratory: Negative for cough, chest tightness, shortness of breath and wheezing.   Cardiovascular: Negative for palpitations and leg swelling.  Gastrointestinal: Negative for nausea and vomiting.  Genitourinary: Negative for dysuria.  Musculoskeletal: Negative for joint swelling.  Skin: Negative for rash.  Neurological: Negative for headaches.  Hematological: Does not bruise/bleed easily.  Psychiatric/Behavioral: Negative for dysphoric mood. The patient is not nervous/anxious.        Objective:   Physical Exam Obese female in no acute distress Nose without purulence or discharge noted Neck without lymphadenopathy or thyromegaly No skin breakdown or pressure necrosis from the C Pap mask Lower extremities with edema noted, no cyanosis Alert and oriented, moves all 4 extremities.       Assessment & Plan:

## 2014-02-12 ENCOUNTER — Telehealth: Payer: Self-pay | Admitting: Internal Medicine

## 2014-02-12 NOTE — Telephone Encounter (Signed)
Pt needs a letter of medical necessity for bariatric sleeve surgery . Fax to [facility] [phone]. Pt has consultation with dr Lucia Gaskins on 03-20-2014

## 2014-02-14 NOTE — Telephone Encounter (Signed)
Misty could you put together a letter of medical necessity  That say because of medical comorbidities namely OSA , dyslipidemia , her   Bariatric surgery is  advised for BMP over 40.  Thanks  [facility]

## 2014-02-17 NOTE — Telephone Encounter (Signed)
Sent to the bariatric coordinator.  Received confirmation that the fax was successful.

## 2014-03-13 ENCOUNTER — Other Ambulatory Visit: Payer: Self-pay | Admitting: General Surgery

## 2014-03-13 ENCOUNTER — Other Ambulatory Visit (INDEPENDENT_AMBULATORY_CARE_PROVIDER_SITE_OTHER): Payer: Self-pay | Admitting: General Surgery

## 2014-03-13 DIAGNOSIS — Z9989 Dependence on other enabling machines and devices: Secondary | ICD-10-CM

## 2014-03-13 DIAGNOSIS — Z6841 Body Mass Index (BMI) 40.0 and over, adult: Principal | ICD-10-CM

## 2014-03-13 DIAGNOSIS — G4733 Obstructive sleep apnea (adult) (pediatric): Secondary | ICD-10-CM

## 2014-03-13 LAB — COMPREHENSIVE METABOLIC PANEL
ALT: 20 U/L (ref 0–35)
AST: 23 U/L (ref 0–37)
Albumin: 3.6 g/dL (ref 3.5–5.2)
Alkaline Phosphatase: 115 U/L (ref 39–117)
BILIRUBIN TOTAL: 0.6 mg/dL (ref 0.2–1.2)
BUN: 12 mg/dL (ref 6–23)
CO2: 26 meq/L (ref 19–32)
CREATININE: 1.03 mg/dL (ref 0.50–1.10)
Calcium: 9.5 mg/dL (ref 8.4–10.5)
Chloride: 107 mEq/L (ref 96–112)
Glucose, Bld: 83 mg/dL (ref 70–99)
Potassium: 4.2 mEq/L (ref 3.5–5.3)
Sodium: 142 mEq/L (ref 135–145)
Total Protein: 6.9 g/dL (ref 6.0–8.3)

## 2014-03-13 LAB — CBC WITH DIFFERENTIAL/PLATELET
BASOS PCT: 0 % (ref 0–1)
Basophils Absolute: 0 10*3/uL (ref 0.0–0.1)
Eosinophils Absolute: 0.1 10*3/uL (ref 0.0–0.7)
Eosinophils Relative: 2 % (ref 0–5)
HCT: 42.7 % (ref 36.0–46.0)
Hemoglobin: 14.1 g/dL (ref 12.0–15.0)
LYMPHS ABS: 2.9 10*3/uL (ref 0.7–4.0)
Lymphocytes Relative: 39 % (ref 12–46)
MCH: 29 pg (ref 26.0–34.0)
MCHC: 33 g/dL (ref 30.0–36.0)
MCV: 87.9 fL (ref 78.0–100.0)
MPV: 10.4 fL (ref 8.6–12.4)
Monocytes Absolute: 0.8 10*3/uL (ref 0.1–1.0)
Monocytes Relative: 11 % (ref 3–12)
Neutro Abs: 3.6 10*3/uL (ref 1.7–7.7)
Neutrophils Relative %: 48 % (ref 43–77)
PLATELETS: 354 10*3/uL (ref 150–400)
RBC: 4.86 MIL/uL (ref 3.87–5.11)
RDW: 13.7 % (ref 11.5–15.5)
WBC: 7.4 10*3/uL (ref 4.0–10.5)

## 2014-03-13 LAB — TSH: TSH: 2.816 u[IU]/mL (ref 0.350–4.500)

## 2014-03-13 LAB — IRON AND TIBC
%SAT: 20 % (ref 20–55)
Iron: 67 ug/dL (ref 42–145)
TIBC: 340 ug/dL (ref 250–470)
UIBC: 273 ug/dL (ref 125–400)

## 2014-03-13 LAB — FERRITIN: Ferritin: 62 ng/mL (ref 10–291)

## 2014-03-13 LAB — LIPID PANEL
CHOLESTEROL: 149 mg/dL (ref 0–200)
HDL: 35 mg/dL — ABNORMAL LOW (ref >39)
LDL Cholesterol: 86 mg/dL (ref 0–99)
Total CHOL/HDL Ratio: 4.3 Ratio
Triglycerides: 138 mg/dL (ref <150)
VLDL: 28 mg/dL (ref 0–40)

## 2014-03-13 LAB — VITAMIN B12: VITAMIN B 12: 617 pg/mL (ref 211–911)

## 2014-03-13 LAB — FOLATE

## 2014-03-14 LAB — URINALYSIS, ROUTINE W REFLEX MICROSCOPIC
BILIRUBIN URINE: NEGATIVE
Glucose, UA: NEGATIVE mg/dL
Hgb urine dipstick: NEGATIVE
Ketones, ur: NEGATIVE mg/dL
Leukocytes, UA: NEGATIVE
Nitrite: NEGATIVE
PROTEIN: NEGATIVE mg/dL
Specific Gravity, Urine: 1.005 (ref 1.005–1.030)
Urobilinogen, UA: 0.2 mg/dL (ref 0.0–1.0)
pH: 6 (ref 5.0–8.0)

## 2014-03-17 LAB — VITAMIN D 1,25 DIHYDROXY
Vitamin D 1, 25 (OH)2 Total: 51 pg/mL (ref 18–72)
Vitamin D2 1, 25 (OH)2: <8 pg/mL
Vitamin D3 1, 25 (OH)2: 51 pg/mL

## 2014-03-17 LAB — H. PYLORI ANTIBODY, IGA: HELICOBACTER PYLORI AB, IGA: 2 U/mL (ref <9.0)

## 2014-03-18 LAB — GLYCOHEMOGLOBIN, TOTAL: Glycohemoglobin (GHb),Total: 7.2 % (ref 5.4–7.4)

## 2014-03-27 ENCOUNTER — Ambulatory Visit (INDEPENDENT_AMBULATORY_CARE_PROVIDER_SITE_OTHER): Payer: BC Managed Care – PPO | Admitting: Family Medicine

## 2014-03-27 ENCOUNTER — Encounter: Payer: Self-pay | Admitting: Family Medicine

## 2014-03-27 VITALS — BP 120/80 | HR 106 | Temp 98.2°F | Ht 61.0 in | Wt 277.7 lb

## 2014-03-27 DIAGNOSIS — J0100 Acute maxillary sinusitis, unspecified: Secondary | ICD-10-CM

## 2014-03-27 MED ORDER — AMOXICILLIN-POT CLAVULANATE 875-125 MG PO TABS: 1.0000 | ORAL_TABLET | Freq: Two times a day (BID) | ORAL | Status: DC

## 2014-03-27 NOTE — Patient Instructions (Signed)
INSTRUCTIONS FOR UPPER RESPIRATORY INFECTION:  -plenty of rest and fluids  --As we discussed, we have prescribed a new medication (AUGMENTIN) for you at this appointment. We discussed the common and serious potential adverse effects of this medication and you can review these and more with the pharmacist when you pick up your medication.  Please follow the instructions for use carefully and notify us immediately if you have any problems taking this medication.  -nasal saline wash 2-3 times daily (use prepackaged nasal saline or bottled/distilled water if making your own)   -can use AFRIN nasal spray for drainage and nasal congestion - but do NOT use longer then 3-4 days  -can use tylenol or ibuprofen as directed for aches and sorethroat  -in the winter time, using a humidifier at night is helpful (please follow cleaning instructions)  -if you are taking a cough medication - use only as directed, may also try a teaspoon of honey to coat the throat and throat lozenges  -for sore throat, salt water gargles can help  -follow up if you have fevers, facial pain, tooth pain, difficulty breathing or are worsening or not getting better in 5-7 days

## 2014-03-27 NOTE — Progress Notes (Signed)
Pre visit review using our clinic review tool, if applicable. No additional management support is needed unless otherwise documented below in the visit note. 

## 2014-03-27 NOTE — Progress Notes (Signed)
HPI:  URI: -started 3 weeks ago, then started to get better then worse the last 1 week -symptoms: thick nasal congestion, HA, max sinus pain R>L, R ear pressure, cough, PND -denies: fevers, SOB, wheezing, NVD -denies: exposure to confirmed flu or strep, tick bites, travel outside the country -denies hx of seasonal allergies, sinus surgery  ROS: See pertinent positives and negatives per HPI.  Past Medical History  Diagnosis Date  . 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 Prematurity     "3 months" 1# 15 oz" ventilator   . 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  Family History  Problem Relation Age of Onset  . Thyroid disease Mother   . Hypertension Mother     History   Social History  . Marital Status: Single    Spouse Name: N/A    Number of Children: N/A  . 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 Smoking status: Never Smoker   . Smokeless tobacco: None  . Alcohol Use: Yes     Comment: socially  . Drug Use: No  . Sexual Activity: None   Other Topics Concern  . 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    Doing much better less stress   Going to grad school in august .  [place] in [employer] education .      Current outpatient prescriptions:  .  amoxicillin-clavulanate (AUGMENTIN) 875-125 MG per tablet, Take 1 tablet by mouth 2 (two) times daily., Disp: 20 tablet, Rfl: 0  EXAM:  Filed Vitals:   03/27/14 1355  BP: 120/80  Pulse: 106  Temp: 98.2 F (36.8 C)    Body mass  index is 52.5 kg/(m^2).  GENERAL: vitals reviewed and listed above, alert, oriented, appears well hydrated and in no acute distress  HEENT: atraumatic, conjunttiva clear, no obvious abnormalities on inspection of external nose and ears, normal appearance of ear canals and TMs but does have bilat effusion, thick nasal congestion, mild post oropharyngeal erythema with PND, no tonsillar edema or exudate, no sinus TTP  NECK: no obvious masses on inspection  LUNGS: clear to auscultation bilaterally, no wheezes, rales or rhonchi, good air movement  CV: HRRR, no peripheral edema  MS: moves all extremities without noticeable abnormality  PSYCH: pleasant and cooperative, no obvious depression or anxiety  ASSESSMENT AND PLAN:  Discussed the following assessment and plan:  Acute maxillary sinusitis, recurrence not specified - Plan: amoxicillin-clavulanate (AUGMENTIN) 875-125 MG per tablet  -likely started as vuri, but with worsening, length of symptoms and sinus pain concern for bacterial sinusitis - given length of symptoms and not improving opted for abx for this after discussion risks and benefits. -Patient advised to return or notify a doctor immediately if symptoms worsen or persist or new concerns arise.  Patient Instructions  INSTRUCTIONS FOR UPPER RESPIRATORY INFECTION:  -plenty of rest and fluids  --As we discussed,  we have prescribed a new medication (AUGMENTIN) for you at this appointment. We discussed the common and serious potential adverse effects of this medication and you can review these and more with the pharmacist when you pick up your medication.  Please follow the instructions for use carefully and notify us immediately if you have any problems taking this medication.  -nasal saline wash 2-3 times daily (use prepackaged nasal saline or bottled/distilled water if making your own)   -can use AFRIN nasal spray for drainage and nasal congestion - but do NOT use longer then 3-4  days  -can use tylenol or ibuprofen as directed for aches and sorethroat  -in the winter time, using a humidifier at night is helpful (please follow cleaning instructions)  -if you are taking a cough medication - use only as directed, may also try a teaspoon of honey to coat the throat and throat lozenges  -for sore throat, salt water gargles can help  -follow up if you have fevers, facial pain, tooth pain, difficulty breathing or are worsening or not getting better in 5-7 days      Adreona Brand R.

## 2014-04-02 ENCOUNTER — Ambulatory Visit (HOSPITAL_COMMUNITY)
Admission: RE | Admit: 2014-04-02 | Discharge: 2014-04-02 | Disposition: A | Discharge status: Home / Self Care | Payer: BC Managed Care – PPO | Source: Ambulatory Visit | Attending: General Surgery | Admitting: General Surgery

## 2014-04-02 ENCOUNTER — Other Ambulatory Visit: Payer: Self-pay

## 2014-04-02 DIAGNOSIS — G4733 Obstructive sleep apnea (adult) (pediatric): Secondary | ICD-10-CM | POA: Insufficient documentation

## 2014-04-02 DIAGNOSIS — K224 Dyskinesia of esophagus: Secondary | ICD-10-CM | POA: Diagnosis not present

## 2014-04-02 DIAGNOSIS — Z9989 Dependence on other enabling machines and devices: Secondary | ICD-10-CM

## 2014-04-02 DIAGNOSIS — Z6841 Body Mass Index (BMI) 40.0 and over, adult: Secondary | ICD-10-CM | POA: Insufficient documentation

## 2014-04-12 ENCOUNTER — Encounter: Payer: Self-pay | Admitting: Dietician

## 2014-04-12 ENCOUNTER — Encounter: Payer: BC Managed Care – PPO | Attending: General Surgery | Admitting: Dietician

## 2014-04-12 DIAGNOSIS — Z713 Dietary counseling and surveillance: Secondary | ICD-10-CM | POA: Insufficient documentation

## 2014-04-12 DIAGNOSIS — Z6841 Body Mass Index (BMI) 40.0 and over, adult: Secondary | ICD-10-CM | POA: Diagnosis not present

## 2014-04-12 NOTE — Progress Notes (Signed)
  Pre-Op Assessment Visit:  Pre-Operative Sleeve Gastrectomy Surgery  Medical Nutrition Therapy:  Appt start time: 0900   End time:  0930.  Patient was seen on 04/12/2014 for Pre-Operative Nutrition Assessment. Assessment and letter of approval completed. Asked patient to return in one month for follow up visit after psychological evaluation d/t active binge eating 2 x week. Recommended that we have a plan in place to deal with emotional and binge eating before moving forward. Will fax completed letter to [facility] once plan is in place.   Preferred Learning Style:   No preference indicated   Learning Readiness:   Ready  Handouts given during visit include:  Pre-Op Goals Bariatric Surgery Protein Shakes   During the appointment today the following Pre-Op Goals were reviewed with the patient: Maintain or lose weight as instructed by your surgeon Make healthy food choices Begin to limit portion sizes Limited concentrated sugars and fried foods Keep fat/sugar in the single digits per serving on   food labels Practice CHEWING your food  (aim for 30 chews per bite or until applesauce consistency) Practice not drinking 15 minutes before, during, and 30 minutes after each meal/snack Avoid all carbonated beverages  Avoid/limit caffeinated beverages  Avoid all sugar-sweetened beverages Consume 3 meals per day; eat every 3-5 hours Make a list of non-food related activities Aim for 64-100 ounces of FLUID daily  Aim for at least 60-80 grams of PROTEIN daily Look for a liquid protein source that contain ?15 g protein and ?5 g carbohydrate  (ex: shakes, drinks, shots)  Patient-Centered Goals: 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 Tiffany Roth is a 10 level of confidence that she will be able to meet pre op goals and feels like it is "extrememly important" to follow pre - op goals.   Demonstrated degree of understanding via:   Teach Back  Teaching Method Utilized:  Visual Auditory Hands on  Barriers to learning/adherence to lifestyle change: emotional eating  [facility] to [facility] to enroll in Pre-Op and Post-Op Nutrition Education when surgery date is scheduled.

## 2014-04-12 NOTE — Patient Instructions (Addendum)
Follow Pre-Op Goals Try Protein Shakes Return for one nutrition follow up visit.  Call [facility] at [phone] when surgery is scheduled to enroll in Pre-Op Class  Things to remember:  Please always be honest with [place]. We want to support you!  If you have any questions or concerns in between appointments, please call or email Ferol Luz, or Margarita Grizzle.  The diet after surgery will be high protein and low in carbohydrate.  Vitamins and calcium need to be taken for the rest of your life.  Feel free to include support people in any classes or appointments.

## 2014-04-15 ENCOUNTER — Telehealth: Payer: Self-pay | Admitting: Dietician

## 2014-04-15 NOTE — Telephone Encounter (Signed)
Spoke with Tiffany Roth today who wanted to clarify what she meant by "binge eating" during our appointment on Saturday. She stated that what she considered large portions of sweet and carbs where things like a"bagel with cream cheese and apples". I stated that what I heard her say was that she ate about 8 different sweets and carbs at one time 2 x week when she was upset. I suggested that we continue with our plan of having her talk to Dr. Ardath Sax this weekend and follow up with me in 4 weeks.

## 2014-05-01 ENCOUNTER — Ambulatory Visit (INDEPENDENT_AMBULATORY_CARE_PROVIDER_SITE_OTHER): Payer: Self-pay | Admitting: General Surgery

## 2014-05-03 ENCOUNTER — Encounter: Payer: Self-pay | Admitting: Family Medicine

## 2014-05-03 ENCOUNTER — Ambulatory Visit (INDEPENDENT_AMBULATORY_CARE_PROVIDER_SITE_OTHER): Payer: BC Managed Care – PPO | Admitting: Family Medicine

## 2014-05-03 VITALS — BP 120/82 | HR 101 | Temp 98.4°F | Resp 16 | Wt 272.8 lb

## 2014-05-03 DIAGNOSIS — J0101 Acute recurrent maxillary sinusitis: Secondary | ICD-10-CM

## 2014-05-03 DIAGNOSIS — J0100 Acute maxillary sinusitis, unspecified: Secondary | ICD-10-CM | POA: Insufficient documentation

## 2014-05-03 DIAGNOSIS — L309 Dermatitis, unspecified: Secondary | ICD-10-CM

## 2014-05-03 MED ORDER — TRIAMCINOLONE ACETONIDE 0.1 % EX OINT: 1.0000 "application " | TOPICAL_OINTMENT | Freq: Two times a day (BID) | CUTANEOUS | Status: DC

## 2014-05-03 MED ORDER — AMOXICILLIN 875 MG PO TABS: 875.0000 mg | ORAL_TABLET | Freq: Two times a day (BID) | ORAL | Status: DC

## 2014-05-03 MED ORDER — CETIRIZINE HCL 10 MG PO TABS: 10.0000 mg | ORAL_TABLET | Freq: Every day | ORAL | Status: DC

## 2014-05-03 NOTE — Assessment & Plan Note (Signed)
Deteriorated.  Pt not currently using steroid cream.  Script sent.  No evidence of superimposed bacterial infection.  Reviewed supportive care and red flags that should prompt return.  Pt expressed understanding and is in agreement w/ plan.

## 2014-05-03 NOTE — Progress Notes (Signed)
   Subjective:    Patient ID: Tiffany Roth, female    DOB: [birth year], 24 y.o.   MRN: [id number]  HPI URI- pt reports nasal congestion, sinus pressure, 'head cold'.  sxs started ~2 weeks ago.  No fevers.  Bilateral ear fullness w/ decreased hearing.  No tooth pain.  No N/V.  + sick contacts.  + cough- productive of yellow sputum.  Hx of similar, feels the same as previous sinus infxns.  + fatigue.  Eczema- pt has hx of eczema, not currently on prescription medication.  Using OTC Aquaphor.     Review of Systems For ROS see HPI     Objective:   Physical Exam  Constitutional: She appears well-developed and well-nourished. No distress.  HENT:  Head: Normocephalic and atraumatic.  Right Ear: Tympanic membrane normal.  Left Ear: Tympanic membrane normal.  Nose: Mucosal edema and rhinorrhea present. Right sinus exhibits maxillary sinus tenderness and frontal sinus tenderness. Left sinus exhibits maxillary sinus tenderness and frontal sinus tenderness.  Mouth/Throat: Uvula is midline and mucous membranes are normal. Posterior oropharyngeal erythema present. No oropharyngeal exudate.  Eyes: Conjunctivae and EOM are normal. Pupils are equal, round, and reactive to light.  Neck: Normal range of motion. Neck supple.  Cardiovascular: Normal rate, regular rhythm and normal heart sounds.   Pulmonary/Chest: Effort normal and breath sounds normal. No respiratory distress. She has no wheezes.  Lymphadenopathy:    She has no cervical adenopathy.  Skin: Skin is warm and dry.  Dry, peeling eczema on dorsum of hands and extensor surfaces of arms bilaterally  Vitals reviewed.         Assessment & Plan:

## 2014-05-03 NOTE — Assessment & Plan Note (Signed)
New.  Pt's sxs and PE consistent w/ infxn.  Start abx.  Reviewed supportive care and red flags that should prompt return.  Pt expressed understanding and is in agreement w/ plan.  

## 2014-05-03 NOTE — Patient Instructions (Signed)
Follow up as needed Start the Amoxicillin twice daily- take w/ food Drink plenty of fluids Start daily Zyrtec (ask the pharmacy if OTC or prescription is cheaper) Use the Triamcinolone twice daily- keep areas well moisturized in between  Call with any questions or concerns Hang in there! GOOD LUCK WITH SURGERY!!!!

## 2014-05-03 NOTE — Progress Notes (Signed)
Pre visit review using our clinic review tool, if applicable. No additional management support is needed unless otherwise documented below in the visit note. 

## 2014-05-12 ENCOUNTER — Ambulatory Visit: Payer: BC Managed Care – PPO

## 2014-05-12 ENCOUNTER — Encounter: Payer: BC Managed Care – PPO | Attending: General Surgery | Admitting: Dietician

## 2014-05-12 ENCOUNTER — Ambulatory Visit: Payer: BC Managed Care – PPO | Admitting: Dietician

## 2014-05-12 DIAGNOSIS — Z713 Dietary counseling and surveillance: Secondary | ICD-10-CM | POA: Diagnosis not present

## 2014-05-12 DIAGNOSIS — Z6841 Body Mass Index (BMI) 40.0 and over, adult: Secondary | ICD-10-CM | POA: Diagnosis not present

## 2014-05-12 NOTE — Progress Notes (Signed)
  Pre-Operative Sleeve Gastrectomy Surgery  Medical Nutrition Therapy:  Appt start time: 1050 end time:  0301.  Primary concerns today: Kaidynce is here today for a follow up for plan for binge eating before bariatric surgery. Stated that she is working with Dr. Ardath Sax, psychiatrist, who she will be seeing before and after surgery to help manage emotional eating.  Ceyda agreed to be honest with me about what is going on with her eating in order for her to get the help and support she needs. She verbalized understanding that the surgery itself will not resolve emotional/binge eating.   Preferred Learning Style:   No preference indicated   Learning Readiness:   Ready   Medications: see list  Nutritional Diagnosis:  [place]-3.3 Obesity related to past poor dietary habits and physical inactivity as evidenced by patient attending supervised weight loss for approval of bariatric surgery.    Intervention:  Nutrition counseling provided. Plan: Work on not drinking 15 minutes before up through 30 minutes after meals. Continue chewing well - 20-30 x per bite. Work on Freight forwarder out carbonation. Continue to follow up with Dr. Ardath Sax before and after surgery.  Please always be honest with [place]. We want to support you!  Teaching Method Utilized:  Visual Auditory Hands on  Barriers to learning/adherence to lifestyle change: emotional eating  Demonstrated degree of understanding via:  Teach Back   Monitoring/Evaluation:  Dietary intake, exercise, and body weight. Follow up for pre op class.

## 2014-05-12 NOTE — Patient Instructions (Signed)
Work on not drinking 15 minutes before up through 30 minutes after meals. Continue chewing well - 20-30 x per bite. Work on Freight forwarder out carbonation. Continue to follow up with Dr. Ardath Sax before and after surgery.  Please always be honest with [place]. We want to support you!

## 2014-05-16 ENCOUNTER — Ambulatory Visit (INDEPENDENT_AMBULATORY_CARE_PROVIDER_SITE_OTHER): Payer: Self-pay | Admitting: General Surgery

## 2014-05-16 NOTE — H&P (Signed)
Tiffany Roth 05/16/2014 11:13 AM Location: [facility] Patient #: [id number] DOB: [birth year] Single / Language: [name] / Race: Black or African American Female  History of Present Illness Tiffany Hiss M. [name] MD; 05/16/2014 11:57 AM) Patient words: pre op visit for gastric sleeve 06/02/2014.  The patient is a 24 year old female who presents for a pre-op visit.  Additional reasons for visit:  Preoperative evaluation is described as the following: She is currently scheduled for laparoscopic sleeve gastrectomy with upper endoscopy on March 28. She was initially seen in the office on January 7. Her weight at that time was 270 pounds. She denies any significant medical changes since she was last seen. She did have a bout of sinusitis and was treated with Zyrtec and antibiotics which she is completed. She denies any fever, chills, nausea, vomiting, diarrhea or constipation. She denies any shortness of breath or dyspnea on exertion. She denies any chest pain. 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  Her chest x-ray and upper GI were unremarkable. Her initial visit labs were unremarkable except for a glycosylated hemoglobin of 7.2. Her HDL level was 35. She is accompanied by her father today.  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 No Current Medications (Taken starting 05/16/2014) ZyrTEC (10MG  Tablet, Oral) Active. Medications Reconciled OxyCODONE HCl (5MG /5ML Solution, 5-10 Milliliter Oral every four hours, as needed, Taken starting 05/16/2014) Active.  Social History Tiffany Curry, MD; 05/16/2014 11:58 AM) No drug use Tobacco use Never smoker. Caffeine use Carbonated beverages. No alcohol use  Family History Tiffany Curry, MD; 05/16/2014 11:58 AM) Breast Cancer Family Members In General. Diabetes Mellitus Mother. Hypertension Mother.  Pregnancy / Birth History Tiffany Curry, MD; 05/16/2014 11:58 AM) [name] 0 Para 0 Age at menarche 88 years. Regular periods  Vitals (Tiffany Spillers MA; 05/16/2014 11:14 AM) 05/16/2014 11:13 AM Weight: 278.8 lb Height: 61in Body Surface Area: 2.33 m Body Mass Index: 52.68 kg/m Temp.: 97.43F(Oral)  Pulse: 118 (Regular)  BP: 138/84 (Sitting, Left Arm, Standard)    Physical Exam Tiffany Hiss M. [name] MD; 05/16/2014 11:55 AM) General Mental Status-Alert. General Appearance-Consistent with stated age. Hydration-Well hydrated. Voice-Normal. Note: morbid obesity; evenly distributed; b/l LE varicose veins   Head and Neck Head-normocephalic, atraumatic with no lesions or palpable masses. Trachea-midline. Thyroid Gland Characteristics - normal size and consistency.  Eye Eyeball - Bilateral-Extraocular movements intact. Sclera/Conjunctiva - Bilateral-No scleral icterus.  Chest and Lung Exam Chest and lung exam reveals -quiet, even and easy respiratory effort with no use of accessory muscles and on auscultation, normal breath sounds, no adventitious sounds and normal vocal resonance. Inspection Chest Wall - Normal. Back - normal.  Breast - Did not examine.  Cardiovascular Cardiovascular examination reveals -normal heart sounds, regular rate and rhythm with no  murmurs and normal pedal pulses bilaterally.  Abdomen  Inspection Inspection of the abdomen reveals - No Hernias. Skin - Scar - no surgical scars. Palpation/Percussion Palpation and Percussion of the abdomen reveal - Soft, Non Tender, No Rebound tenderness, No Rigidity (guarding) and No hepatosplenomegaly. Auscultation Auscultation of the abdomen reveals - Bowel sounds normal.  Peripheral Vascular Upper Extremity Palpation - Pulses bilaterally normal.  Neurologic Neurologic evaluation reveals -alert and oriented x 3 with no impairment of recent or remote memory. Mental Status-Normal.  Neuropsychiatric The patient's mood and affect are described as -normal. Judgment and Insight-insight is appropriate concerning matters relevant to self.  Musculoskeletal Normal Exam - Left-Upper Extremity Strength Normal and Lower Extremity Strength Normal. Normal Exam - Right-Upper Extremity Strength Normal and Lower Extremity Strength Normal. Note: some crepitus L knee   Lymphatic Head & Neck  General Head & Neck Lymphatics: Bilateral - Description - Normal. Axillary - Did not examine. Femoral & Inguinal - Did not examine.    Assessment & Plan Tiffany Hiss M. Aniko Finnigan MD; 05/16/2014 11:58 AM) MORBID OBESITY WITH BMI OF 50.0-59.9, ADULT (278.01  E66.01) Impression: We reviewed her preoperative workup. We discussed the importance of the preoperative diet. We discussed how it is important to help shrink the size of the liver. She was given her postoperative pain medicine prescription today. She was given handouts on support groups. We discussed the typical postoperative course. We discussed the importance of adhering to the postoperative diet plan. We also discussed the postoperative eating techniques and behaviors. All of her questions were asked and answered. She is scheduled for the preoperative classes coming Monday. Current Plans  Instructions: Congratulations on starting your journey  to a healthier life! Over the next few weeks you will be undergoing tests (x-rays and labs) and seeing specialists to help evaluate you for weight loss surgery. These tests and consultations with a psychologist and nutritionist are needed to prepare you for the lifestyle changes that lie ahead and are often required by insurance companies to approve you for surgery. Please call me if you have any questions during the evaluation.  Pathway to Surgery:  Two weeks prior to surgery Go on the extremely low carb liquid diet - this will decrease the size of your liver which will make surgery safer - the nutritionist will go over this at a later date Attend preoperative appointment with your surgeon Attend preoperative surgery class  One week prior to surgery No aspirin products. Tylenol is acceptable  24 hours prior to surgery No alcoholic beverages Report fever greater than 100.5 or excessive nasal drainage suggesting infection Continue bariatric preop diet Perform bowel prep if ordered Do not eat or drink anything after midnight the night before surgery Do not take any medications except those instructed by the anesthesiologist  Morning of surgery Please arrive at the hospital at least 2 hours before your scheduled surgery time. No makeup, fingernail polish or jewelry Bring insurance cards with you Bring your CPAP mask if you use this Started OxyCODONE HCl 5MG /5ML, 5-10 Milliliter every four hours, as needed, 200 Milliliter, 05/16/2014, No Refill. OBSTRUCTIVE SLEEP APNEA ON CPAP (327.23  G47.33) PREDIABETES (790.29  R73.09)  Leighton Ruff. Redmond Pulling, MD, FACS General, Bariatric, & Minimally Invasive Surgery [facility], [place]

## 2014-05-19 VITALS — Ht 61.0 in | Wt 282.5 lb

## 2014-05-19 DIAGNOSIS — E669 Obesity, unspecified: Secondary | ICD-10-CM

## 2014-05-20 ENCOUNTER — Other Ambulatory Visit: Payer: Self-pay | Admitting: Family Medicine

## 2014-05-20 NOTE — Progress Notes (Signed)
  Pre-Operative Nutrition Class:  Appt start time: 8097   End time:  1830.  Patient was seen on 05/19/14 for Pre-Operative Bariatric Surgery Education at [facility].   Surgery date: 06/02/14 Surgery type: Gastric sleeve Start weight at [facility]: 275.5 lbs on 04/12/14 Weight today: 282.5 lbs  TANITA  BODY COMP RESULTS  05/19/14   BMI (kg/m^2) 53.4   Fat Mass (lbs) 136   Fat Free Mass (lbs) 146.5   Total Body Water (lbs) 107   Samples given per MNT protocol. Patient educated on appropriate usage: Premier protein shake (strawberry - qty 1) Lot #: [id number] Exp: 11/2014  Unjury protein powder (unflavored - qty 1) Lot #: [id number] Exp: 04/2015  Bariatric Advantage Calcium citrate chews (orange - qty 1) Lot #: [id number] Exp: 07/2014  PB2 (qty 1) Lot #: [id number] Exp: 10/2014  The following the learning objectives were met by the patient during this course:  Identify Pre-Op Dietary Goals and will begin 2 weeks pre-operatively  Identify appropriate sources of fluids and proteins   State protein recommendations and appropriate sources pre and post-operatively  Identify Post-Operative Dietary Goals and will follow for 2 weeks post-operatively  Identify appropriate multivitamin and calcium sources  Describe the need for physical activity post-operatively and will follow MD recommendations  State when to call healthcare provider regarding medication questions or post-operative complications  Handouts given during class include:  Pre-Op Bariatric Surgery Diet Handout  Protein Shake Handout  Post-Op Bariatric Surgery Nutrition Handout  BELT Program Information Flyer  Support Group Information Flyer  [facility] Bariatric Supplements Price List  Follow-Up Plan: Patient will follow-up at [facility] 2 weeks post operatively for diet advancement per MD.

## 2014-05-22 ENCOUNTER — Encounter (HOSPITAL_COMMUNITY): Payer: Self-pay

## 2014-05-22 ENCOUNTER — Encounter (HOSPITAL_COMMUNITY)
Admission: RE | Admit: 2014-05-22 | Discharge: 2014-05-22 | Disposition: A | Discharge status: Home / Self Care | Payer: BC Managed Care – PPO | Source: Ambulatory Visit | Attending: General Surgery | Admitting: General Surgery

## 2014-05-22 DIAGNOSIS — G4733 Obstructive sleep apnea (adult) (pediatric): Secondary | ICD-10-CM | POA: Insufficient documentation

## 2014-05-22 DIAGNOSIS — Z6841 Body Mass Index (BMI) 40.0 and over, adult: Secondary | ICD-10-CM | POA: Diagnosis not present

## 2014-05-22 DIAGNOSIS — Z01812 Encounter for preprocedural laboratory examination: Secondary | ICD-10-CM | POA: Diagnosis present

## 2014-05-22 HISTORY — DX: Dermatitis, unspecified: L30.9

## 2014-05-22 HISTORY — DX: Sleep apnea, unspecified: G47.30

## 2014-05-22 HISTORY — DX: Chronic sinusitis, unspecified: J32.9

## 2014-05-22 LAB — CBC WITH DIFFERENTIAL/PLATELET
BASOS ABS: 0 10*3/uL (ref 0.0–0.1)
Basophils Relative: 1 % (ref 0–1)
EOS ABS: 0.2 10*3/uL (ref 0.0–0.7)
Eosinophils Relative: 2 % (ref 0–5)
HEMATOCRIT: 45.1 % (ref 36.0–46.0)
HEMOGLOBIN: 14.9 g/dL (ref 12.0–15.0)
LYMPHS PCT: 36 % (ref 12–46)
Lymphs Abs: 2.5 10*3/uL (ref 0.7–4.0)
MCH: 29.9 pg (ref 26.0–34.0)
MCHC: 33 g/dL (ref 30.0–36.0)
MCV: 90.4 fL (ref 78.0–100.0)
MONO ABS: 0.7 10*3/uL (ref 0.1–1.0)
Monocytes Relative: 11 % (ref 3–12)
NEUTROS ABS: 3.5 10*3/uL (ref 1.7–7.7)
NEUTROS PCT: 50 % (ref 43–77)
Platelets: 304 10*3/uL (ref 150–400)
RBC: 4.99 MIL/uL (ref 3.87–5.11)
RDW: 12.7 % (ref 11.5–15.5)
WBC: 7 10*3/uL (ref 4.0–10.5)

## 2014-05-22 LAB — COMPREHENSIVE METABOLIC PANEL
ALT: 19 U/L (ref 0–35)
AST: 26 U/L (ref 0–37)
Albumin: 3.8 g/dL (ref 3.5–5.2)
Alkaline Phosphatase: 121 U/L — ABNORMAL HIGH (ref 39–117)
Anion gap: 9 (ref 5–15)
BILIRUBIN TOTAL: 0.7 mg/dL (ref 0.3–1.2)
BUN: 24 mg/dL — AB (ref 6–23)
CO2: 26 mmol/L (ref 19–32)
CREATININE: 1.13 mg/dL — AB (ref 0.50–1.10)
Calcium: 10 mg/dL (ref 8.4–10.5)
Chloride: 107 mmol/L (ref 96–112)
GFR, EST AFRICAN AMERICAN: 78 mL/min — AB (ref >90)
GFR, EST NON AFRICAN AMERICAN: 67 mL/min — AB (ref >90)
GLUCOSE: 96 mg/dL (ref 70–99)
Potassium: 4.8 mmol/L (ref 3.5–5.1)
Sodium: 142 mmol/L (ref 135–145)
TOTAL PROTEIN: 8.1 g/dL (ref 6.0–8.3)

## 2014-05-22 LAB — HCG, SERUM, QUALITATIVE: Preg, Serum: NEGATIVE

## 2014-05-22 NOTE — Patient Instructions (Signed)
Tiffany Roth  05/22/2014   Your procedure is scheduled on:     Monday June 02, 2014   Report to [facility] Main Entrance and follow signs to  [place] arrive at 8:00 AM.   Call this number if you have problems the morning of surgery [phone] or Presurgical Testing [phone].   Remember:  Do not eat food or drink liquids :After Midnight.   For Cpap use: bring mask and tubing only.     Take these medicines the morning of surgery with A SIP OF WATER: Certirizine (Zyrtec)                               You may not have any metal on your body including hair pins and piercings  Do not wear jewelry, make-up, lotions, powders, prefumes or deodorant.  Do not shave body hair  48 hours(2 days) of CHG soap use.                Do not bring valuables to the hospital. [place].  Contacts, dentures or bridgework may not be worn into surgery.  Leave suitcase in the car. After surgery it may be brought to your room.  For patients admitted to the hospital, checkout time is 11:00 AM the day of discharge.   ________________________________________________________________________  [facility] - Preparing for Surgery Before surgery, you can play an important role.  Because skin is not sterile, your skin needs to be as free of germs as possible.  You can reduce the number of germs on your skin by washing with CHG (chlorahexidine gluconate) soap before surgery.  CHG is an antiseptic cleaner which kills germs and bonds with the skin to continue killing germs even after washing. Please DO NOT use if you have an allergy to CHG or antibacterial soaps.  If your skin becomes reddened/irritated stop using the CHG and inform your nurse when you arrive at Short Stay. Do not shave (including legs and underarms) for at least 48 hours prior to the first CHG shower.  You may shave your face/neck. Please follow these instructions carefully:  1.  Shower  with CHG Soap the night before surgery and the  morning of Surgery.  2.  If you choose to wash your hair, wash your hair first as usual with your  normal  shampoo.  3.  After you shampoo, rinse your hair and body thoroughly to remove the  shampoo.                           4.  Use CHG as you would any other liquid soap.  You can apply chg directly  to the skin and wash                       Gently with a scrungie or clean washcloth.  5.  Apply the CHG Soap to your body ONLY FROM THE NECK DOWN.   Do not use on face/ open                           Wound or open sores. Avoid contact with eyes, ears mouth and genitals (private parts).  Wash face,  Genitals (private parts) with your normal soap.             6.  Wash thoroughly, paying special attention to the area where your surgery  will be performed.  7.  Thoroughly rinse your body with warm water from the neck down.  8.  DO NOT shower/wash with your normal soap after using and rinsing off  the CHG Soap.                9.  Pat yourself dry with a clean towel.            10.  Wear clean pajamas.            11.  Place clean sheets on your bed the night of your first shower and do not  sleep with pets. Day of Surgery : Do not apply any lotions/deodorants the morning of surgery.  Please wear clean clothes to the hospital/surgery center.  FAILURE TO FOLLOW THESE INSTRUCTIONS MAY RESULT IN THE CANCELLATION OF YOUR SURGERY PATIENT SIGNATURE_________________________________  NURSE SIGNATURE__________________________________  ________________________________________________________________________

## 2014-05-22 NOTE — Progress Notes (Signed)
CMP in epic per PAT visit 05/22/2014 sent to Dr Ok Anis

## 2014-05-22 NOTE — Progress Notes (Addendum)
CXR and EKG per epic 04/02/2014  OV note per Dr Gwenette Greet 01/10/2014

## 2014-05-29 ENCOUNTER — Other Ambulatory Visit (HOSPITAL_COMMUNITY): Payer: BC Managed Care – PPO

## 2014-06-02 ENCOUNTER — Encounter (HOSPITAL_COMMUNITY)
Admission: RE | Disposition: A | Discharge status: Home / Self Care | Payer: Self-pay | Source: Ambulatory Visit | Attending: General Surgery

## 2014-06-02 ENCOUNTER — Inpatient Hospital Stay (HOSPITAL_COMMUNITY)
Admission: RE | Admit: 2014-06-02 | Discharge: 2014-06-06 | DRG: 620 | Disposition: A | Discharge status: Home / Self Care | Payer: BC Managed Care – PPO | Source: Ambulatory Visit | Attending: General Surgery | Admitting: General Surgery

## 2014-06-02 ENCOUNTER — Inpatient Hospital Stay (HOSPITAL_COMMUNITY): Payer: BC Managed Care – PPO | Admitting: Certified Registered Nurse Anesthetist

## 2014-06-02 ENCOUNTER — Encounter (HOSPITAL_COMMUNITY): Payer: Self-pay | Admitting: *Deleted

## 2014-06-02 DIAGNOSIS — Z01812 Encounter for preprocedural laboratory examination: Secondary | ICD-10-CM

## 2014-06-02 DIAGNOSIS — I8390 Asymptomatic varicose veins of unspecified lower extremity: Secondary | ICD-10-CM | POA: Diagnosis present

## 2014-06-02 DIAGNOSIS — E039 Hypothyroidism, unspecified: Secondary | ICD-10-CM | POA: Diagnosis present

## 2014-06-02 DIAGNOSIS — R740 Nonspecific elevation of levels of transaminase and lactic acid dehydrogenase [LDH]: Secondary | ICD-10-CM | POA: Diagnosis present

## 2014-06-02 DIAGNOSIS — Z6841 Body Mass Index (BMI) 40.0 and over, adult: Secondary | ICD-10-CM

## 2014-06-02 DIAGNOSIS — I10 Essential (primary) hypertension: Secondary | ICD-10-CM | POA: Diagnosis present

## 2014-06-02 DIAGNOSIS — E869 Volume depletion, unspecified: Secondary | ICD-10-CM | POA: Diagnosis present

## 2014-06-02 DIAGNOSIS — Z8249 Family history of ischemic heart disease and other diseases of the circulatory system: Secondary | ICD-10-CM

## 2014-06-02 DIAGNOSIS — G4733 Obstructive sleep apnea (adult) (pediatric): Secondary | ICD-10-CM | POA: Diagnosis present

## 2014-06-02 DIAGNOSIS — Z79899 Other long term (current) drug therapy: Secondary | ICD-10-CM | POA: Diagnosis not present

## 2014-06-02 DIAGNOSIS — R7303 Prediabetes: Secondary | ICD-10-CM | POA: Diagnosis present

## 2014-06-02 DIAGNOSIS — E230 Hypopituitarism: Secondary | ICD-10-CM | POA: Diagnosis present

## 2014-06-02 DIAGNOSIS — R110 Nausea: Secondary | ICD-10-CM

## 2014-06-02 DIAGNOSIS — K449 Diaphragmatic hernia without obstruction or gangrene: Secondary | ICD-10-CM | POA: Diagnosis present

## 2014-06-02 DIAGNOSIS — E232 Diabetes insipidus: Secondary | ICD-10-CM | POA: Diagnosis present

## 2014-06-02 DIAGNOSIS — Z833 Family history of diabetes mellitus: Secondary | ICD-10-CM

## 2014-06-02 DIAGNOSIS — R7309 Other abnormal glucose: Secondary | ICD-10-CM | POA: Diagnosis present

## 2014-06-02 DIAGNOSIS — Z803 Family history of malignant neoplasm of breast: Secondary | ICD-10-CM | POA: Diagnosis not present

## 2014-06-02 DIAGNOSIS — Z9884 Bariatric surgery status: Secondary | ICD-10-CM

## 2014-06-02 DIAGNOSIS — N179 Acute kidney failure, unspecified: Secondary | ICD-10-CM | POA: Diagnosis not present

## 2014-06-02 HISTORY — PX: LAPAROSCOPIC GASTRIC SLEEVE RESECTION: SHX5895

## 2014-06-02 HISTORY — PX: UPPER GI ENDOSCOPY: SHX6162

## 2014-06-02 LAB — GLUCOSE, CAPILLARY
Glucose-Capillary: 105 mg/dL — ABNORMAL HIGH (ref 70–99)
Glucose-Capillary: 113 mg/dL — ABNORMAL HIGH (ref 70–99)
Glucose-Capillary: 86 mg/dL (ref 70–99)

## 2014-06-02 LAB — HEMOGLOBIN AND HEMATOCRIT, BLOOD
HEMATOCRIT: 43.6 % (ref 36.0–46.0)
HEMOGLOBIN: 14.4 g/dL (ref 12.0–15.0)

## 2014-06-02 LAB — PREGNANCY, URINE: Preg Test, Ur: NEGATIVE

## 2014-06-02 SURGERY — GASTRECTOMY, SLEEVE, LAPAROSCOPIC
Anesthesia: General

## 2014-06-02 MED ORDER — LIDOCAINE HCL (CARDIAC) 20 MG/ML IV SOLN
INTRAVENOUS | Status: DC | PRN
  Administered 2014-06-02: 75 mg via INTRAVENOUS

## 2014-06-02 MED ORDER — PROMETHAZINE HCL 25 MG/ML IJ SOLN: 6.2500 mg | INTRAMUSCULAR | Status: DC | PRN

## 2014-06-02 MED ORDER — DEXAMETHASONE SODIUM PHOSPHATE 10 MG/ML IJ SOLN
INTRAMUSCULAR | Status: DC | PRN
  Administered 2014-06-02: 10 mg via INTRAVENOUS

## 2014-06-02 MED ORDER — PROMETHAZINE HCL 25 MG/ML IJ SOLN: 12.5000 mg | Freq: Four times a day (QID) | INTRAMUSCULAR | Status: DC | PRN

## 2014-06-02 MED ORDER — MEPERIDINE HCL 50 MG/ML IJ SOLN: 6.2500 mg | INTRAMUSCULAR | Status: DC | PRN

## 2014-06-02 MED ORDER — INSULIN ASPART 100 UNIT/ML ~~LOC~~ SOLN
0.0000 [IU] | SUBCUTANEOUS | Status: DC
  Administered 2014-06-04 – 2014-06-05 (×3): 2 [IU] via SUBCUTANEOUS

## 2014-06-02 MED ORDER — CHLORHEXIDINE GLUCONATE 4 % EX LIQD: 60.0000 mL | Freq: Once | CUTANEOUS | Status: DC

## 2014-06-02 MED ORDER — GLYCOPYRROLATE 0.2 MG/ML IJ SOLN
INTRAMUSCULAR | Status: AC
  Filled 2014-06-02: qty 3

## 2014-06-02 MED ORDER — CISATRACURIUM BESYLATE (PF) 10 MG/5ML IV SOLN
INTRAVENOUS | Status: DC | PRN
  Administered 2014-06-02: 10 mg via INTRAVENOUS
  Administered 2014-06-02: 2 mg via INTRAVENOUS

## 2014-06-02 MED ORDER — FENTANYL CITRATE 0.05 MG/ML IJ SOLN: 25.0000 ug | INTRAMUSCULAR | Status: DC | PRN

## 2014-06-02 MED ORDER — CISATRACURIUM BESYLATE 20 MG/10ML IV SOLN
INTRAVENOUS | Status: AC
  Filled 2014-06-02: qty 10

## 2014-06-02 MED ORDER — ENOXAPARIN SODIUM 30 MG/0.3ML ~~LOC~~ SOLN
30.0000 mg | Freq: Two times a day (BID) | SUBCUTANEOUS | Status: DC
  Administered 2014-06-03 – 2014-06-06 (×7): 30 mg via SUBCUTANEOUS
  Filled 2014-06-02 (×10): qty 0.3

## 2014-06-02 MED ORDER — HEPARIN SODIUM (PORCINE) 5000 UNIT/ML IJ SOLN
5000.0000 [IU] | INTRAMUSCULAR | Status: AC
  Administered 2014-06-02: 5000 [IU] via SUBCUTANEOUS
  Filled 2014-06-02: qty 1

## 2014-06-02 MED ORDER — 0.9 % SODIUM CHLORIDE (POUR BTL) OPTIME
TOPICAL | Status: DC | PRN
  Administered 2014-06-02: 1000 mL

## 2014-06-02 MED ORDER — UNJURY CHICKEN SOUP POWDER: 2.0000 [oz_av] | Freq: Four times a day (QID) | ORAL | Status: DC

## 2014-06-02 MED ORDER — PROPOFOL 10 MG/ML IV BOLUS
INTRAVENOUS | Status: DC | PRN
Start: 2014-06-02 — End: 2014-06-02
  Administered 2014-06-02: 200 mg via INTRAVENOUS

## 2014-06-02 MED ORDER — PHENYLEPHRINE HCL 10 MG/ML IJ SOLN
INTRAMUSCULAR | Status: DC | PRN
  Administered 2014-06-02: 80 ug via INTRAVENOUS
  Administered 2014-06-02: 20 ug via INTRAVENOUS
  Administered 2014-06-02: 40 ug via INTRAVENOUS
  Administered 2014-06-02: 20 ug via INTRAVENOUS
  Administered 2014-06-02: 80 ug via INTRAVENOUS

## 2014-06-02 MED ORDER — ONDANSETRON HCL 4 MG/2ML IJ SOLN
INTRAMUSCULAR | Status: AC
  Filled 2014-06-02: qty 2

## 2014-06-02 MED ORDER — SODIUM CHLORIDE 0.9 % IJ SOLN
INTRAMUSCULAR | Status: AC
  Filled 2014-06-02: qty 50

## 2014-06-02 MED ORDER — FENTANYL CITRATE 0.05 MG/ML IJ SOLN
INTRAMUSCULAR | Status: DC | PRN
  Administered 2014-06-02 (×4): 50 ug via INTRAVENOUS
  Administered 2014-06-02 (×2): 100 ug via INTRAVENOUS
  Administered 2014-06-02 (×2): 50 ug via INTRAVENOUS

## 2014-06-02 MED ORDER — SODIUM CHLORIDE 0.9 % IV SOLN
10.0000 mg | INTRAVENOUS | Status: DC | PRN
  Administered 2014-06-02: 40 ug/min via INTRAVENOUS

## 2014-06-02 MED ORDER — LACTATED RINGERS IV SOLN: INTRAVENOUS | Status: DC

## 2014-06-02 MED ORDER — MIDAZOLAM HCL 2 MG/2ML IJ SOLN
INTRAMUSCULAR | Status: AC
  Filled 2014-06-02: qty 2

## 2014-06-02 MED ORDER — PHENYLEPHRINE HCL 10 MG/ML IJ SOLN
INTRAMUSCULAR | Status: AC
  Filled 2014-06-02: qty 1

## 2014-06-02 MED ORDER — MIDAZOLAM HCL 5 MG/5ML IJ SOLN
INTRAMUSCULAR | Status: DC | PRN
  Administered 2014-06-02: 2 mg via INTRAVENOUS

## 2014-06-02 MED ORDER — MORPHINE SULFATE 2 MG/ML IJ SOLN
2.0000 mg | INTRAMUSCULAR | Status: DC | PRN
  Administered 2014-06-02 (×2): 4 mg via INTRAVENOUS
  Administered 2014-06-03 (×2): 2 mg via INTRAVENOUS
  Administered 2014-06-03 (×2): 4 mg via INTRAVENOUS
  Filled 2014-06-02: qty 2
  Filled 2014-06-02: qty 1
  Filled 2014-06-02: qty 2
  Filled 2014-06-02: qty 1
  Filled 2014-06-02 (×2): qty 2

## 2014-06-02 MED ORDER — CEFOTETAN DISODIUM-DEXTROSE 2-2.08 GM-% IV SOLR
2.0000 g | INTRAVENOUS | Status: AC
  Administered 2014-06-02: 2 g via INTRAVENOUS

## 2014-06-02 MED ORDER — NEOSTIGMINE METHYLSULFATE 10 MG/10ML IV SOLN
INTRAVENOUS | Status: DC | PRN
  Administered 2014-06-02: 3 mg via INTRAVENOUS

## 2014-06-02 MED ORDER — ONDANSETRON HCL 4 MG/2ML IJ SOLN
INTRAMUSCULAR | Status: DC | PRN
  Administered 2014-06-02: 4 mg via INTRAVENOUS

## 2014-06-02 MED ORDER — OXYCODONE HCL 5 MG/5ML PO SOLN
5.0000 mg | ORAL | Status: DC | PRN
  Administered 2014-06-04 (×4): 5 mg via ORAL
  Administered 2014-06-05: 10 mg via ORAL
  Filled 2014-06-02 (×2): qty 5
  Filled 2014-06-02: qty 50
  Filled 2014-06-02 (×2): qty 5

## 2014-06-02 MED ORDER — ACETAMINOPHEN 160 MG/5ML PO SOLN: 325.0000 mg | ORAL | Status: DC | PRN

## 2014-06-02 MED ORDER — FENTANYL CITRATE 0.05 MG/ML IJ SOLN
INTRAMUSCULAR | Status: AC
  Filled 2014-06-02: qty 5

## 2014-06-02 MED ORDER — MORPHINE SULFATE 4 MG/ML IJ SOLN
INTRAMUSCULAR | Status: AC
  Administered 2014-06-02: 4 mg via INTRAVENOUS
  Filled 2014-06-02: qty 1

## 2014-06-02 MED ORDER — ACETAMINOPHEN 160 MG/5ML PO SOLN: 650.0000 mg | ORAL | Status: DC | PRN

## 2014-06-02 MED ORDER — UNJURY VANILLA POWDER
2.0000 [oz_av] | Freq: Four times a day (QID) | ORAL | Status: DC
  Administered 2014-06-04: 2 [oz_av] via ORAL

## 2014-06-02 MED ORDER — NEOSTIGMINE METHYLSULFATE 10 MG/10ML IV SOLN
INTRAVENOUS | Status: AC
  Filled 2014-06-02: qty 1

## 2014-06-02 MED ORDER — UNJURY CHOCOLATE CLASSIC POWDER
2.0000 [oz_av] | Freq: Four times a day (QID) | ORAL | Status: DC
  Administered 2014-06-04 – 2014-06-06 (×8): 2 [oz_av] via ORAL

## 2014-06-02 MED ORDER — LIDOCAINE HCL (CARDIAC) 20 MG/ML IV SOLN
INTRAVENOUS | Status: AC
  Filled 2014-06-02: qty 5

## 2014-06-02 MED ORDER — DEXAMETHASONE SODIUM PHOSPHATE 10 MG/ML IJ SOLN
INTRAMUSCULAR | Status: AC
  Filled 2014-06-02: qty 1

## 2014-06-02 MED ORDER — CEFOTETAN DISODIUM-DEXTROSE 2-2.08 GM-% IV SOLR
INTRAVENOUS | Status: AC
  Filled 2014-06-02: qty 50

## 2014-06-02 MED ORDER — ONDANSETRON HCL 4 MG/2ML IJ SOLN
4.0000 mg | INTRAMUSCULAR | Status: DC | PRN
Start: 2014-06-02 — End: 2014-06-06

## 2014-06-02 MED ORDER — LABETALOL HCL 5 MG/ML IV SOLN
INTRAVENOUS | Status: AC
  Filled 2014-06-02: qty 4

## 2014-06-02 MED ORDER — STERILE WATER FOR IRRIGATION IR SOLN
Status: DC | PRN
  Administered 2014-06-02: 1500 mL

## 2014-06-02 MED ORDER — SODIUM CHLORIDE 0.9 % IJ SOLN
INTRAMUSCULAR | Status: AC
  Filled 2014-06-02: qty 10

## 2014-06-02 MED ORDER — POTASSIUM CHLORIDE IN NACL 20-0.45 MEQ/L-% IV SOLN
INTRAVENOUS | Status: DC
  Administered 2014-06-02 – 2014-06-03 (×3): via INTRAVENOUS
  Filled 2014-06-02 (×4): qty 1000

## 2014-06-02 MED ORDER — SODIUM CHLORIDE 0.9 % IJ SOLN
INTRAMUSCULAR | Status: DC | PRN
  Administered 2014-06-02: 50 mL

## 2014-06-02 MED ORDER — SUCCINYLCHOLINE CHLORIDE 20 MG/ML IJ SOLN
INTRAMUSCULAR | Status: DC | PRN
  Administered 2014-06-02: 120 mg via INTRAVENOUS

## 2014-06-02 MED ORDER — LACTATED RINGERS IV SOLN
INTRAVENOUS | Status: DC
  Administered 2014-06-02: 12:00:00 via INTRAVENOUS
  Administered 2014-06-02: 1000 mL via INTRAVENOUS
  Administered 2014-06-02: 11:00:00 via INTRAVENOUS

## 2014-06-02 MED ORDER — LACTATED RINGERS IR SOLN
Status: DC | PRN
  Administered 2014-06-02: 1

## 2014-06-02 MED ORDER — PANTOPRAZOLE SODIUM 40 MG IV SOLR
40.0000 mg | Freq: Every day | INTRAVENOUS | Status: DC
  Administered 2014-06-02 – 2014-06-05 (×4): 40 mg via INTRAVENOUS
  Filled 2014-06-02 (×5): qty 40

## 2014-06-02 MED ORDER — EPHEDRINE SULFATE 50 MG/ML IJ SOLN
INTRAMUSCULAR | Status: AC
  Filled 2014-06-02: qty 1

## 2014-06-02 MED ORDER — BUPIVACAINE LIPOSOME 1.3 % IJ SUSP
20.0000 mL | Freq: Once | INTRAMUSCULAR | Status: AC
  Administered 2014-06-02: 20 mL
  Filled 2014-06-02: qty 20

## 2014-06-02 MED ORDER — PROPOFOL 10 MG/ML IV BOLUS
INTRAVENOUS | Status: AC
  Filled 2014-06-02: qty 20

## 2014-06-02 MED ORDER — GLYCOPYRROLATE 0.2 MG/ML IJ SOLN
INTRAMUSCULAR | Status: DC | PRN
  Administered 2014-06-02: .15 mg via INTRAVENOUS

## 2014-06-02 SURGICAL SUPPLY — 64 items
APL SRG 32X5 SNPLK LF DISP (MISCELLANEOUS)
APPLICATOR COTTON TIP 6IN STRL (MISCELLANEOUS) IMPLANT
APPLIER CLIP ROT 10 11.4 M/L (STAPLE)
APR CLP MED LRG 11.4X10 (STAPLE)
BLADE SURG SZ11 CARB STEEL (BLADE) ×3 IMPLANT
CABLE HIGH FREQUENCY MONO STRZ (ELECTRODE) IMPLANT
CHLORAPREP W/TINT 26ML (MISCELLANEOUS) ×6 IMPLANT
CLIP APPLIE ROT 10 11.4 M/L (STAPLE) IMPLANT
DEVICE SUT QUICK LOAD TK 5 (STAPLE) IMPLANT
DEVICE SUT TI-KNOT TK 5X26 (MISCELLANEOUS) IMPLANT
DEVICE SUTURE ENDOST 10MM (ENDOMECHANICALS) IMPLANT
DEVICE TI KNOT TK5 (MISCELLANEOUS)
DEVICE TROCAR PUNCTURE CLOSURE (ENDOMECHANICALS) ×3 IMPLANT
DRAPE CAMERA CLOSED 9X96 (DRAPES) ×3 IMPLANT
DRAPE UTILITY XL STRL (DRAPES) ×6 IMPLANT
ELECT REM PT RETURN 9FT ADLT (ELECTROSURGICAL) ×3
ELECTRODE REM PT RTRN 9FT ADLT (ELECTROSURGICAL) ×1 IMPLANT
GAUZE SPONGE 4X4 12PLY STRL (GAUZE/BANDAGES/DRESSINGS) IMPLANT
GLOVE BIOGEL M STRL SZ7.5 (GLOVE) ×3 IMPLANT
GOWN STRL REUS W/TWL XL LVL3 (GOWN DISPOSABLE) ×11 IMPLANT
HOVERMATT SINGLE USE (MISCELLANEOUS) ×3 IMPLANT
KIT BASIN OR (CUSTOM PROCEDURE TRAY) ×3 IMPLANT
LIQUID BAND (GAUZE/BANDAGES/DRESSINGS) ×3 IMPLANT
NDL SPNL 22GX3.5 QUINCKE BK (NEEDLE) ×1 IMPLANT
NEEDLE SPNL 22GX3.5 QUINCKE BK (NEEDLE) ×3 IMPLANT
PACK UNIVERSAL I (CUSTOM PROCEDURE TRAY) ×3 IMPLANT
PEN SKIN MARKING BROAD (MISCELLANEOUS) ×3 IMPLANT
QUICK LOAD TK 5 (STAPLE)
RELOAD STAPLE 60 3.6 BLU REG (STAPLE) IMPLANT
RELOAD STAPLE 60 3.8 GOLD REG (STAPLE) IMPLANT
RELOAD STAPLE 60 4.1 GRN THCK (STAPLE) IMPLANT
RELOAD STAPLER BLUE 60MM (STAPLE) IMPLANT
RELOAD STAPLER GOLD 60MM (STAPLE) ×1 IMPLANT
RELOAD STAPLER GREEN 60MM (STAPLE) ×2 IMPLANT
SCISSORS LAP 5X35 DISP (ENDOMECHANICALS) ×2 IMPLANT
SCISSORS LAP 5X45 EPIX DISP (ENDOMECHANICALS) ×3 IMPLANT
SEALANT SURGICAL APPL DUAL CAN (MISCELLANEOUS) ×1 IMPLANT
SET IRRIG TUBING LAPAROSCOPIC (IRRIGATION / IRRIGATOR) ×3 IMPLANT
SHEARS CURVED HARMONIC AC 45CM (MISCELLANEOUS) ×3 IMPLANT
SLEEVE ADV FIXATION 5X100MM (TROCAR) ×6 IMPLANT
SLEEVE GASTRECTOMY 36FR VISIGI (MISCELLANEOUS) ×3 IMPLANT
SLEEVE XCEL OPT CAN 5 100 (ENDOMECHANICALS) IMPLANT
SOLUTION ANTI FOG 6CC (MISCELLANEOUS) ×3 IMPLANT
SPONGE LAP 18X18 X RAY DECT (DISPOSABLE) ×3 IMPLANT
STAPLER ECHELON BIOABSB 60 FLE (MISCELLANEOUS) ×10 IMPLANT
STAPLER ECHELON LONG 60 440 (INSTRUMENTS) ×2 IMPLANT
STAPLER RELOAD BLUE 60MM (STAPLE)
STAPLER RELOAD GOLD 60MM (STAPLE) ×3
STAPLER RELOAD GREEN 60MM (STAPLE) ×6
SUT MNCRL AB 4-0 PS2 18 (SUTURE) ×3 IMPLANT
SUT SURGIDAC NAB ES-9 0 48 120 (SUTURE) IMPLANT
SUT VICRYL 0 TIES 12 18 (SUTURE) ×3 IMPLANT
SYR 20CC LL (SYRINGE) ×3 IMPLANT
SYR 50ML LL SCALE MARK (SYRINGE) ×3 IMPLANT
TOWEL OR 17X26 10 PK STRL BLUE (TOWEL DISPOSABLE) ×3 IMPLANT
TOWEL OR NON WOVEN STRL DISP B (DISPOSABLE) ×3 IMPLANT
TRAY FOLEY CATH 14FRSI W/METER (CATHETERS) IMPLANT
TROCAR ADV FIXATION 5X100MM (TROCAR) ×3 IMPLANT
TROCAR BLADELESS 15MM (ENDOMECHANICALS) IMPLANT
TROCAR BLADELESS OPT 5 100 (ENDOMECHANICALS) ×3 IMPLANT
TUBING CONNECTING 10 (TUBING) ×2 IMPLANT
TUBING CONNECTING 10' (TUBING) ×1
TUBING ENDO SMARTCAP (MISCELLANEOUS) ×3 IMPLANT
TUBING FILTER THERMOFLATOR (ELECTROSURGICAL) ×3 IMPLANT

## 2014-06-02 NOTE — Anesthesia Preprocedure Evaluation (Addendum)
Anesthesia Evaluation  Patient identified by MRN, date of birth, ID band Patient awake    Reviewed: Allergy & Precautions, NPO status , Patient's Chart, lab work & pertinent test results  Airway Mallampati: II  TM Distance: >3 FB Neck ROM: Full    Dental no notable dental hx.    Pulmonary sleep apnea and Continuous Positive Airway Pressure Ventilation ,  breath sounds clear to auscultation  Pulmonary exam normal       Cardiovascular negative cardio ROS  Rhythm:Regular Rate:Normal     Neuro/Psych negative neurological ROS  negative psych ROS   GI/Hepatic negative GI ROS, Neg liver ROS,   Endo/Other  Morbid obesity  Renal/GU negative Renal ROS  negative genitourinary   Musculoskeletal negative musculoskeletal ROS (+)   Abdominal   Peds negative pediatric ROS (+)  Hematology negative hematology ROS (+)   Anesthesia Other Findings   Reproductive/Obstetrics negative OB ROS                            Anesthesia Physical Anesthesia Plan  ASA: III  Anesthesia Plan: General   Post-op Pain Management:    Induction: Intravenous  Airway Management Planned: Oral ETT  Additional Equipment:   Intra-op Plan:   Post-operative Plan: Extubation in OR  Informed Consent: I have reviewed the patients History and Physical, chart, labs and discussed the procedure including the risks, benefits and alternatives for the proposed anesthesia with the patient or authorized representative who has indicated his/her understanding and acceptance.   Dental advisory given  Plan Discussed with: CRNA  Anesthesia Plan Comments:         Anesthesia Quick Evaluation

## 2014-06-02 NOTE — H&P (View-Only) (Signed)
Tiffany Roth 05/16/2014 11:13 AM Location: [facility] Patient #: [id number] DOB: [birth year] Single / Language: Tiffany Roth / Race: Black or African American Female  History of Present Illness Tiffany Roth M. Tiffany Aguilera MD; 05/16/2014 11:57 AM) Patient words: pre op visit for gastric sleeve 06/02/2014.  The patient is a 24 year old female who presents for a pre-op visit.  Additional reasons for visit:  Preoperative evaluation is described as the following: She is currently scheduled for laparoscopic sleeve gastrectomy with upper endoscopy on March 28. She was initially seen in the office on January 7. Her weight at that time was 270 pounds. She denies any significant medical changes since she was last seen. She did have a bout of sinusitis and was treated with Zyrtec and antibiotics which she is completed. She denies any fever, chills, nausea, vomiting, diarrhea or constipation. She denies any shortness of breath or dyspnea on exertion. She denies any chest pain. 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  Her chest x-ray and upper GI were unremarkable. Her initial visit labs were unremarkable except for a glycosylated hemoglobin of 7.2. Her HDL level was 35. She is accompanied by her father today.  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 No Current Medications (Taken starting 05/16/2014) ZyrTEC (10MG  Tablet, Oral) Active. Medications Reconciled OxyCODONE HCl (5MG /5ML Solution, 5-10 Milliliter Oral every four hours, as needed, Taken starting 05/16/2014) Active.  Social History Tiffany Curry, MD; 05/16/2014 11:58 AM) No drug use Tobacco use Never smoker. Caffeine use Carbonated beverages. No alcohol use  Family History Tiffany Curry, MD; 05/16/2014 11:58 AM) Breast Cancer Family Members In General. Diabetes Mellitus Mother. Hypertension Mother.  Pregnancy / Birth History Tiffany Curry, MD; 05/16/2014 11:58 AM) Tiffany Roth 0 Para 0 Roth at menarche 20 years. Regular periods  Vitals (Tiffany Roth; 05/16/2014 11:14 AM) 05/16/2014 11:13 AM Weight: 278.8 lb Height: 61in Body Surface Area: 2.33 m Body Mass Index: 52.68 kg/m Temp.: 97.94F(Oral)  Pulse: 118 (Regular)  BP: 138/84 (Sitting, Left Arm, Standard)    Physical Exam Tiffany Roth M. Tiffany Newkirk MD; 05/16/2014 11:55 AM) General Mental Status-Alert. General Appearance-Consistent with stated Roth. Hydration-Well hydrated. Voice-Normal. Note: morbid obesity; evenly distributed; b/l LE varicose veins   Head and Neck Head-normocephalic, atraumatic with no lesions or palpable masses. Trachea-midline. Thyroid Gland Characteristics - normal size and consistency.  Eye Eyeball - Bilateral-Extraocular movements intact. Sclera/Conjunctiva - Bilateral-No scleral icterus.  Chest and Lung Exam Chest and lung exam reveals -quiet, even and easy respiratory effort with no use of accessory muscles and on auscultation, normal breath sounds, no adventitious sounds and normal vocal resonance. Inspection Chest Wall - Normal. Back - normal.  Breast - Did not examine.  Cardiovascular Cardiovascular examination reveals -normal heart sounds, regular rate and rhythm with no  murmurs and normal pedal pulses bilaterally.  Abdomen  Inspection Inspection of the abdomen reveals - No Hernias. Skin - Scar - no surgical scars. Palpation/Percussion Palpation and Percussion of the abdomen reveal - Soft, Non Tender, No Rebound tenderness, No Rigidity (guarding) and No hepatosplenomegaly. Auscultation Auscultation of the abdomen reveals - Bowel sounds normal.  Peripheral Vascular Upper Extremity Palpation - Pulses bilaterally normal.  Neurologic Neurologic evaluation reveals -alert and oriented x 3 with no impairment of recent or remote memory. Mental Status-Normal.  Neuropsychiatric The patient's mood and affect are described as -normal. Judgment and Insight-insight is appropriate concerning matters relevant to self.  Musculoskeletal Normal Exam - Left-Upper Extremity Strength Normal and Lower Extremity Strength Normal. Normal Exam - Right-Upper Extremity Strength Normal and Lower Extremity Strength Normal. Note: some crepitus L knee   Lymphatic Head & Neck  General Head & Neck Lymphatics: Bilateral - Description - Normal. Axillary - Did not examine. Femoral & Inguinal - Did not examine.    Assessment & Plan Tiffany Roth M. Tiffany Lesmeister MD; 05/16/2014 11:58 AM) MORBID OBESITY WITH BMI OF 50.0-59.9, ADULT (278.01  E66.01) Impression: We reviewed her preoperative workup. We discussed the importance of the preoperative diet. We discussed how it is important to help shrink the size of the liver. She was given her postoperative pain medicine prescription today. She was given handouts on support groups. We discussed the typical postoperative course. We discussed the importance of adhering to the postoperative diet plan. We also discussed the postoperative eating techniques and behaviors. All of her questions were asked and answered. She is scheduled for the preoperative classes coming Monday. Current Plans  Instructions: Congratulations on starting your journey  to a healthier life! Over the next few weeks you will be undergoing tests (x-rays and labs) and seeing specialists to help evaluate you for weight loss surgery. These tests and consultations with a psychologist and nutritionist are needed to prepare you for the lifestyle changes that lie ahead and are often required by insurance companies to approve you for surgery. Please call me if you have any questions during the evaluation.  Pathway to Surgery:  Two weeks prior to surgery Go on the extremely low carb liquid diet - this will decrease the size of your liver which will make surgery safer - the nutritionist will go over this at a later date Attend preoperative appointment with your surgeon Attend preoperative surgery class  One week prior to surgery No aspirin products. Tylenol is acceptable  24 hours prior to surgery No alcoholic beverages Report fever greater than 100.5 or excessive nasal drainage suggesting infection Continue bariatric preop diet Perform bowel prep if ordered Do not eat or drink anything after midnight the night before surgery Do not take any medications except those instructed by the anesthesiologist  Morning of surgery Please arrive at the hospital at least 2 hours before your scheduled surgery time. No makeup, fingernail polish or jewelry Bring insurance cards with you Bring your CPAP mask if you use this Started OxyCODONE HCl 5MG /5ML, 5-10 Milliliter every four hours, as needed, 200 Milliliter, 05/16/2014, No Refill. OBSTRUCTIVE SLEEP APNEA ON CPAP (327.23  G47.33) PREDIABETES (790.29  R73.09)  Leighton Ruff. Redmond Pulling, MD, FACS General, Bariatric, & Minimally Invasive Surgery [facility], [place]

## 2014-06-02 NOTE — Op Note (Signed)
06/02/2014 Tiffany Roth October 25, 1990 [id number]   PRE-OPERATIVE DIAGNOSIS:   Morbid obesity BMI 51 Prediabetes OSA on CPAP  POST-OPERATIVE DIAGNOSIS:  same  PROCEDURE:  Procedure(s): LAPAROSCOPIC SLEEVE GASTRECTOMY  UPPER GI ENDOSCOPY  SURGEON:  Surgeon(s): Gayland Curry, MD FACS FASMBS  ASSISTANTS: Johnathan Hausen, MD FACS  ANESTHESIA:   general  DRAINS: none   BOUGIE: 36 fr ViSiGi  LOCAL MEDICATIONS USED:  MARCAINE + 70cc Exparel  SPECIMEN:  Source of Specimen:  Greater curvature of stomach  DISPOSITION OF SPECIMEN:  PATHOLOGY  COUNTS:  YES  INDICATION FOR PROCEDURE: This is a very pleasant 24 year old morbidly obese AAF who has had unsuccessful attempts for sustained weight loss. She presents today for a planned laparoscopic sleeve gastrectomy with upper endoscopy. We have discussed the risk and benefits of the procedure extensively preoperatively. Please see my separate notes.  PROCEDURE: After obtaining informed consent and receiving 5000 units of subcutaneous heparin, the patient was brought to the operating room at [facility] and placed supine on the operating room table. General endotracheal anesthesia was established. Sequential compression devices were placed. A orogastric tube was placed. The patient's abdomen was prepped and draped in the usual standard surgical fashion. She received preoperative IV antibiotics. A surgical timeout was performed.  Access to the abdomen was achieved using a 5 mm 0 laparoscope thru a 5 mm trocar In the left upper Quadrant 2 fingerbreadths below the left subcostal margin using the Optiview technique. Pneumoperitoneum was smoothly established up to 15 mm of mercury. The laparoscope was advanced and the abdominal cavity was surveilled. There was evidence of a hiatal hernia on laparoscopy - gap in the left and right crus anteriorly.  A 5 mm trocar was placed slightly above and to the left of the umbilicus under direct  visualization. The patient was then placed in reverse Trendelenburg. The [facility] liver retractor was placed under the left lobe of the liver through a 5 mm trocar incision site in the subxiphoid position. A 5 mm trocar was placed in the lateral right upper quadrant along with a 15 mm trocar in the mid right abdomen  All under direct visualization after local had been infiltrated.  The stomach was inspected. It was completely decompressed and the orogastric tube was removed.  There was no anterior dimple that was obviously visible.   We identified the pylorus and measured 5- 6 cm proximal to the pylorus and identified an area of where we would start taking down the short gastric vessels. Harmonic scalpel was used to take down the short gastric vessels along the greater curvature of the stomach. We were able to enter the lesser sac. We continued to march along the greater curvature of the stomach taking down the short gastrics. As we approached the gastrosplenic ligament we took care in this area not to injure the spleen. We were able to take down the entire gastrosplenic ligament. We then mobilized the fundus away from the left crus of diaphragm. There was no evidence of a hiatal hernia. There were not any significant posterior gastric avascular attachments. This left the stomach completely mobilized. No vessels had been taken down along the lesser curvature of the stomach.  We then reidentified the pylorus. A 36Fr ViSiGi was then placed in the oropharynx and advanced down into the stomach and placed in the distal antrum and positioned along the lesser curvature. It was placed under suction which secured the 36Fr ViSiGi in place along the lesser curve. Then using the Ethicon  echelon 60 mm stapler with a green load with Seamguard, I placed a stapler along the antrum approximately 5 cm from the pylorus. The stapler was angled so that there is ample room at the angularis incisura. I then fired the first staple  load after inspecting it posteriorly to ensure adequate space both anteriorly and posteriorly. At this point I still was not completely past the angularis so with another green load with Seamguard, I placed the stapler in position just inside the prior stapleline. We then rotated the stomach to insure that there was adequate anteriorly as well as posteriorly. The stapler was then fired. I used another 3mm green cartridge with seamguard. At this point I continued using 60 mm green load staple cartridge with Seamguard for 1 additional firing then switched to a 31mm gold with seamguard. The echelon stapler was then repositioned with a 60 mm gold load with Seamguard and we continued to march up along the [place]. My assistant was holding traction along the greater curvature stomach along the cauterized short gastric vessels ensuring that the stomach was symmetrically retracted. Prior to each firing of the staple, we rotated the stomach to ensure that there is adequate stomach left.  As we approached the fundus, I used 60 mm blue cartridge with Seamguard aiming slightly lateral to the esophageal fat pad. Although the staples on this fire had completely gone thru the last part of the stomach it had not completely cut it. Therefore 1 additional 60 blue load was used to free the remaining stomach. The sleeve was inspected. There is no evidence of cork screw. The staple line appeared hemostatic. The CRNA inflated the ViSiGi to the green zone and the upper abdomen was flooded with saline. There were no bubbles. The sleeve was decompressed and the ViSiGi removed. My assistant scrubbed out and performed an upper endoscopy. The sleeve easily distended with air and the scope was easily advanced to the pylorus. There is no evidence of internal bleeding or cork screwing. There was no narrowing at the angularis. There is no evidence of bubbles. Please see his operative note for further details. The gastric sleeve was decompressed  and the endoscope was removed.  The greater curvature the stomach was grasped with a laparoscopic grasper and removed from the 15 mm trocar site.  The liver retractor was removed. I then closed the 15 mm trocar site with 2 interrupted 0 Vicryl sutures through the fascia using the endoclose. The closure was viewed laparoscopically and it was airtight. 70 cc of Exparel was then infiltrated in the preperitoneal spaces around the trocar sites. Pneumoperitoneum was released. All trocar sites were closed with a 4-0 Monocryl in a subcuticular fashion followed by the application of Dermabond. The patient was extubated and taken to the recovery room in stable condition. All needle, instrument, and sponge counts were correct x2. There are no immediate complications  (3) 60 mm green with Seamguard (1) 60 mm gold with seamguard (2) 60 mm blue with 1 seamguard  PLAN OF CARE: Admit to inpatient   PATIENT DISPOSITION:  PACU - hemodynamically stable.   Delay start of Pharmacological VTE agent (>24hrs) due to surgical blood loss or risk of bleeding:  no  Leighton Ruff. Redmond Pulling, MD, FACS General, Bariatric, & Minimally Invasive Surgery [facility], [place]

## 2014-06-02 NOTE — Interval H&P Note (Signed)
History and Physical Interval Note:  06/02/2014 9:19 AM  Tiffany Roth  has presented today for surgery, with the diagnosis of Morbid Obesity  The various methods of treatment have been discussed with the patient and family. After consideration of risks, benefits and other options for treatment, the patient has consented to  Procedure(s): LAPAROSCOPIC GASTRIC SLEEVE RESECTION (N/A) UPPER GI ENDOSCOPY (N/A) as a surgical intervention .  The patient's history has been reviewed, patient examined, no change in status, stable for surgery.  I have reviewed the patient's chart and labs.  Questions were answered to the patient's satisfaction.    Leighton Ruff. Redmond Pulling, MD, [place], Bariatric, & Minimally Invasive Surgery [facility], [place]   [facility] M

## 2014-06-02 NOTE — Anesthesia Postprocedure Evaluation (Signed)
  Anesthesia Post-op Note  Patient: Tiffany Roth  Procedure(s) Performed: Procedure(s) (LRB): LAPAROSCOPIC GASTRIC SLEEVE RESECTION (N/A) UPPER GI ENDOSCOPY (N/A)  Patient Location: PACU  Anesthesia Type: General  Level of Consciousness: awake and alert   Airway and Oxygen Therapy: Patient Spontanous Breathing  Post-op Pain: mild  Post-op Assessment: Post-op Vital signs reviewed, Patient's Cardiovascular Status Stable, Respiratory Function Stable, Patent Airway and No signs of Nausea or vomiting  Last Vitals:  Filed Vitals:   06/02/14 1345  BP: 145/87  Pulse: 102  Temp: 37.4 C  Resp: 16    Post-op Vital Signs: stable   Complications: No apparent anesthesia complications

## 2014-06-02 NOTE — Op Note (Signed)
DEYNA CARBON [id number] 11/12/1990 06/02/2014  Preoperative diagnosis: sleeve gastrectomy in progress  Postoperative diagnosis: Same   Procedure: Upper endoscopy   Surgeon: Catalina Antigua B. Hassell Done  M.D., FACS   Anesthesia: Gen.   Indications for procedure: This patient was undergoing a sleeve gastrectomy and endoscopy was used to look for bleeding and leaks.    Description of procedure: The endoscopy was placed in the mouth and into the oropharynx and under endoscopic vision it was advanced to the esophagogastric junction.  The pouch was insufflated and the staple line examined down to the antrum and pylorus.     No bleeding or leaks were detected.  The scope was withdrawn without difficulty.     Matt B. Hassell Done, MD, FACS General, Bariatric, & Minimally Invasive Surgery [facility] Surgery, [place]

## 2014-06-02 NOTE — Transfer of Care (Signed)
Immediate Anesthesia Transfer of Care Note  Patient: Tiffany Roth  Procedure(s) Performed: Procedure(s): LAPAROSCOPIC GASTRIC SLEEVE RESECTION (N/A) UPPER GI ENDOSCOPY (N/A)  Patient Location: PACU  Anesthesia Type:General  Level of Consciousness: awake, alert , oriented, patient cooperative and responds to stimulation  Airway & Oxygen Therapy: Patient Spontanous Breathing and Patient connected to face mask oxygen  Post-op Assessment: Report given to RN, Post -op Vital signs reviewed and stable and Patient moving all extremities  Post vital signs: Reviewed and stable  Last Vitals:  Filed Vitals:   06/02/14 1232  BP: 138/82  Pulse: 115  Temp: 36.6 C  Resp: 19    Complications: No apparent anesthesia complications

## 2014-06-02 NOTE — Anesthesia Procedure Notes (Signed)
Procedure Name: Intubation Date/Time: 06/02/2014 10:30 AM Performed by: Ofilia Neas Pre-anesthesia Checklist: Patient identified, Timeout performed, Emergency Drugs available, Suction available and Patient being monitored Patient Re-evaluated:Patient Re-evaluated prior to inductionOxygen Delivery Method: Circle system utilized Preoxygenation: Pre-oxygenation with 100% oxygen Intubation Type: IV induction Ventilation: Mask ventilation without difficulty Laryngoscope Size: Mac and 4 Grade View: Grade I Tube type: Oral Tube size: 7.5 mm Number of attempts: 1 Airway Equipment and Method: Stylet Placement Confirmation: ETT inserted through vocal cords under direct vision,  positive ETCO2 and breath sounds checked- equal and bilateral Secured at: 21 cm Tube secured with: Tape Dental Injury: Teeth and Oropharynx as per pre-operative assessment

## 2014-06-03 ENCOUNTER — Encounter (HOSPITAL_COMMUNITY): Payer: Self-pay | Admitting: General Surgery

## 2014-06-03 ENCOUNTER — Inpatient Hospital Stay (HOSPITAL_COMMUNITY): Payer: BC Managed Care – PPO

## 2014-06-03 LAB — COMPREHENSIVE METABOLIC PANEL
ALBUMIN: 3.9 g/dL (ref 3.5–5.2)
ALK PHOS: 110 U/L (ref 39–117)
ALT: 52 U/L — ABNORMAL HIGH (ref 0–35)
ANION GAP: 13 (ref 5–15)
AST: 52 U/L — AB (ref 0–37)
BUN: 16 mg/dL (ref 6–23)
CO2: 26 mmol/L (ref 19–32)
Calcium: 10.2 mg/dL (ref 8.4–10.5)
Chloride: 119 mmol/L — ABNORMAL HIGH (ref 96–112)
Creatinine, Ser: 1.45 mg/dL — ABNORMAL HIGH (ref 0.50–1.10)
GFR calc Af Amer: 57 mL/min — ABNORMAL LOW (ref >90)
GFR, EST NON AFRICAN AMERICAN: 50 mL/min — AB (ref >90)
Glucose, Bld: 108 mg/dL — ABNORMAL HIGH (ref 70–99)
Potassium: 4.3 mmol/L (ref 3.5–5.1)
Sodium: 158 mmol/L — ABNORMAL HIGH (ref 135–145)
Total Bilirubin: 1 mg/dL (ref 0.3–1.2)
Total Protein: 8.5 g/dL — ABNORMAL HIGH (ref 6.0–8.3)

## 2014-06-03 LAB — GLUCOSE, CAPILLARY
GLUCOSE-CAPILLARY: 100 mg/dL — AB (ref 70–99)
Glucose-Capillary: 107 mg/dL — ABNORMAL HIGH (ref 70–99)
Glucose-Capillary: 108 mg/dL — ABNORMAL HIGH (ref 70–99)
Glucose-Capillary: 81 mg/dL (ref 70–99)
Glucose-Capillary: 81 mg/dL (ref 70–99)
Glucose-Capillary: 96 mg/dL (ref 70–99)

## 2014-06-03 LAB — CBC WITH DIFFERENTIAL/PLATELET
Basophils Absolute: 0 10*3/uL (ref 0.0–0.1)
Basophils Relative: 0 % (ref 0–1)
EOS PCT: 0 % (ref 0–5)
Eosinophils Absolute: 0 10*3/uL (ref 0.0–0.7)
HCT: 47.7 % — ABNORMAL HIGH (ref 36.0–46.0)
HEMOGLOBIN: 15.5 g/dL — AB (ref 12.0–15.0)
LYMPHS PCT: 15 % (ref 12–46)
Lymphs Abs: 2.5 10*3/uL (ref 0.7–4.0)
MCH: 30.3 pg (ref 26.0–34.0)
MCHC: 32.5 g/dL (ref 30.0–36.0)
MCV: 93.3 fL (ref 78.0–100.0)
Monocytes Absolute: 2.8 10*3/uL — ABNORMAL HIGH (ref 0.1–1.0)
Monocytes Relative: 17 % — ABNORMAL HIGH (ref 3–12)
NEUTROS ABS: 11.4 10*3/uL — AB (ref 1.7–7.7)
NEUTROS PCT: 68 % (ref 43–77)
PLATELETS: 358 10*3/uL (ref 150–400)
RBC: 5.11 MIL/uL (ref 3.87–5.11)
RDW: 13.2 % (ref 11.5–15.5)
WBC: 16.7 10*3/uL — ABNORMAL HIGH (ref 4.0–10.5)

## 2014-06-03 LAB — BASIC METABOLIC PANEL
Anion gap: 11 (ref 5–15)
BUN: 16 mg/dL (ref 6–23)
CO2: 26 mmol/L (ref 19–32)
Calcium: 10.2 mg/dL (ref 8.4–10.5)
Chloride: 121 mmol/L — ABNORMAL HIGH (ref 96–112)
Creatinine, Ser: 1.43 mg/dL — ABNORMAL HIGH (ref 0.50–1.10)
GFR calc non Af Amer: 50 mL/min — ABNORMAL LOW (ref >90)
GFR, EST AFRICAN AMERICAN: 58 mL/min — AB (ref >90)
GLUCOSE: 114 mg/dL — AB (ref 70–99)
POTASSIUM: 4.2 mmol/L (ref 3.5–5.1)
Sodium: 158 mmol/L — ABNORMAL HIGH (ref 135–145)

## 2014-06-03 LAB — HEMOGLOBIN AND HEMATOCRIT, BLOOD
HEMATOCRIT: 48.1 % — AB (ref 36.0–46.0)
HEMOGLOBIN: 15.3 g/dL — AB (ref 12.0–15.0)

## 2014-06-03 MED ORDER — ALUM & MAG HYDROXIDE-SIMETH 200-200-20 MG/5ML PO SUSP: 15.0000 mL | Freq: Four times a day (QID) | ORAL | Status: DC | PRN

## 2014-06-03 MED ORDER — KCL IN DEXTROSE-NACL 20-5-0.2 MEQ/L-%-% IV SOLN
INTRAVENOUS | Status: DC
  Administered 2014-06-03 – 2014-06-04 (×2): via INTRAVENOUS
  Filled 2014-06-03 (×6): qty 1000

## 2014-06-03 MED ORDER — IOHEXOL 300 MG/ML  SOLN
50.0000 mL | Freq: Once | INTRAMUSCULAR | Status: AC | PRN
  Administered 2014-06-03: 50 mL via ORAL

## 2014-06-03 NOTE — Progress Notes (Signed)
Dr. Redmond Pulling notified of pt's tachycardia- pulse consistently 120's throughout.  Pt states she gets anxious.  Other vitals WNL.  Pt up walking in hallway frequently w/ steady gait.  Pt states pain  is moderate

## 2014-06-03 NOTE — Progress Notes (Signed)
Patient alert and oriented, Post op day 1.  Provided support and encouragement.  Encouraged pulmonary toilet, ambulation and small sips of liquids when swallow study returned satisfactorily.  All questions answered.  Will continue to monitor. 

## 2014-06-03 NOTE — Progress Notes (Signed)
1 Day Post-Op  Subjective: Pain better now. No significant nausea. Ambulated already.   Objective: Vital signs in last 24 hours: Temp:  [97.6 F (36.4 C)-99.3 F (37.4 C)] 98.2 F (36.8 C) (03/29 0619) Pulse Rate:  [100-125] 121 (03/29 0619) Resp:  [16-21] 18 (03/29 0619) BP: (120-145)/(70-93) 141/80 mmHg (03/29 0619) SpO2:  [94 %-100 %] 100 % (03/29 0619) Weight:  [119.296 kg (263 lb)] 119.296 kg (263 lb) (03/29 0753) Last BM Date: 06/01/14  Intake/Output from previous day: 03/28 0701 - 03/29 0700 In: 4706.3 [I.V.:4706.3] Out: -  Intake/Output this shift:    Asleep, easily awakes, on CPAP cta b/l Tachy Soft, approp TTP, incision c/d/i; ND No edema; +SCDS  Lab Results:   Recent Labs  06/02/14 1455 06/03/14 0524  WBC  --  16.7*  HGB 14.4 15.5*  HCT 43.6 47.7*  PLT  --  358   BMET  Recent Labs  06/03/14 0524 06/03/14 0837  NA 158* 158*  K 4.3 4.2  CL 119* 121*  CO2 26 26  GLUCOSE 108* 114*  BUN 16 16  CREATININE 1.45* 1.43*  CALCIUM 10.2 10.2   PT/INR No results for input(s): LABPROT, INR in the last 72 hours. ABG No results for input(s): PHART, HCO3 in the last 72 hours.  Invalid input(s): PCO2, PO2  Studies/Results: Dg Ugi W/water Sol Cm  06/03/2014   CLINICAL DATA:  Status post gastric sleeve procedure.  EXAM: WATER SOLUBLE UPPER GI SERIES  TECHNIQUE: Single-column upper GI series was performed using water soluble contrast.  CONTRAST:  53mL OMNIPAQUE IOHEXOL 300 MG/ML  SOLN  COMPARISON:  None.  FLUOROSCOPY TIME:  Radiation Exposure Index (as provided by the fluoroscopic device):  If the device does not provide the exposure index:  Fluoroscopy Time (in minutes and seconds):  1 minutes, 14 seconds  Number of Acquired Images:  6  FINDINGS: Upon the initial swallowing of a 25 cc bolus, there is some to and fro motion in the esophagus with eventual filling of the stomach. A second 25 cc bolus was administered. No leak or complicating feature associated  with the gastric sleeve is identified. Duodenum unremarkable.  IMPRESSION: 1. No leak or complicating feature associated with gastric sleeve identified.   Electronically Signed   By: Van Clines M.D.   On: 06/03/2014 10:54    Anti-infectives: Anti-infectives    Start     Dose/Rate Route Frequency Ordered Stop   06/02/14 0759  cefoTEtan in Dextrose 5% (CEFOTAN) IVPB 2 g     2 g Intravenous On call to O.R. 06/02/14 0759 06/02/14 1015      Assessment/Plan: s/p Procedure(s): LAPAROSCOPIC GASTRIC SLEEVE RESECTION (N/A) UPPER GI ENDOSCOPY (N/A)  GI- swallow ok. Will start POD 1 diet Renal - good uop, Cr up slightly; cont mIVF, repeat lab in am CV- tachycardia, pt was tachycardiac preop with HR120. Will monitor. No signs of bleeding or leak VTE prophylaxis - cont lovenox, scd, ambulate OSA - cont cpap at night Prediabetes - BS ok F/E/N - pod 1 diet; change IVF given hypernatremia.   Leighton Ruff. Redmond Pulling, MD, FACS General, Bariatric, & Minimally Invasive Surgery [facility], [place]   LOS: 1 day    Gayland Curry 06/03/2014

## 2014-06-03 NOTE — Plan of Care (Signed)
Problem: Food- and Nutrition-Related Knowledge Deficit (NB-1.1) Goal: Nutrition education Formal process to instruct or train a patient/client in a skill or to impart knowledge to help patients/clients voluntarily manage or modify food choices and eating behavior to maintain or improve health. Outcome: Completed/Met Date Met:  06/03/14 Nutrition Education Note  Received consult for diet education per DROP protocol.   Discussed 2 week post op diet with pt. Emphasized that liquids must be non carbonated, non caffeinated, and sugar free. Fluid goals discussed. Pt to follow up with outpatient bariatric RD for further diet progression after 2 weeks. Multivitamins and minerals also reviewed. Teach back method used, pt expressed understanding, expect good compliance.   Diet: First 2 Weeks  You will see the nutritionist about two (2) weeks after your surgery. The nutritionist will increase the types of foods you can eat if you are handling liquids well:  If you have severe vomiting or nausea and cannot handle clear liquids lasting longer than 1 day, call your surgeon  Protein Shake  Drink at least 2 ounces of shake 5-6 times per day  Each serving of protein shakes (usually 8 - 12 ounces) should have a minimum of:  15 grams of protein  And no more than 5 grams of carbohydrate  Goal for protein each day:  Men = 80 grams per day  Women = 60 grams per day  Protein powder may be added to fluids such as non-fat milk or Lactaid milk or Soy milk (limit to 35 grams added protein powder per serving)   Hydration  Slowly increase the amount of water and other clear liquids as tolerated (See Acceptable Fluids)  Slowly increase the amount of protein shake as tolerated  Sip fluids slowly and throughout the day  May use sugar substitutes in small amounts (no more than 6 - 8 packets per day; i.e. Splenda)   Fluid Goal  The first goal is to drink at least 8 ounces of protein shake/drink per day (or as directed  by the nutritionist); some examples of protein shakes are [employer], [employer], EAS Edge HP, and [place]. See handout from pre-op Bariatric Education Class:  Slowly increase the amount of protein shake you drink as tolerated  You may find it easier to slowly sip shakes throughout the day  It is important to get your proteins in first  Your fluid goal is to drink 64 - 100 ounces of fluid daily  It may take a few weeks to build up to this  32 oz (or more) should be clear liquids  And  32 oz (or more) should be full liquids (see below for examples)  Liquids should not contain sugar, caffeine, or carbonation   Clear Liquids:  Water or Sugar-free flavored water (i.e. Fruit H2O, Propel)  Decaffeinated coffee or tea (sugar-free)  Crystal Lite, Wyler's Lite, Minute Maid Lite  Sugar-free Jell-O  Bouillon or broth  Sugar-free Popsicle: *Less than 20 calories each; Limit 1 per day   Full Liquids:  Protein Shakes/Drinks + 2 choices per day of other full liquids  Full liquids must be:  No More Than 12 grams of Carbs per serving  No More Than 3 grams of Fat per serving  Strained low-fat cream soup  Non-Fat milk  Fat-free Lactaid Milk  Sugar-free yogurt (Dannon Lite & Fit, [place] yogurt)   [place], [place], [place]

## 2014-06-03 NOTE — Care Management Note (Signed)
    Page 1 of 1   06/03/2014     12:26:32 PM CARE MANAGEMENT NOTE 06/03/2014  Patient:  Tiffany Roth, Tiffany Roth   Account Number:  [id number]  Date Initiated:  06/03/2014  Documentation initiated by:  [facility]  Subjective/Objective Assessment:   24yo female admitted s/p sleeve gastrectomy. PTA lived at home with parents.     Action/Plan:   Home when stable   Anticipated DC Date:  06/06/2014   Anticipated DC Plan:  [place]  CM consult      Choice offered to / List presented to:             Status of service:  Completed, signed off Medicare Important Message given?   (If response is "NO", the following Medicare IM given date fields will be blank) Date Medicare IM given:   Medicare IM given by:   Date Additional Medicare IM given:   Additional Medicare IM given by:    Discharge Disposition:  HOME/SELF CARE  Per UR Regulation:  Reviewed for med. necessity/level of care/duration of stay  If discussed at [place] of Stay Meetings, dates discussed:    Comments:

## 2014-06-03 NOTE — Progress Notes (Signed)
RT placed patient on CPAP. CPAP setting is auto 5-20 cmH2O.sterile water added to water chamber for humidification. Patient is tolerating well. RT will continue to monitor as needed.

## 2014-06-04 LAB — COMPREHENSIVE METABOLIC PANEL
ALBUMIN: 3.6 g/dL (ref 3.5–5.2)
ALT: 96 U/L — ABNORMAL HIGH (ref 0–35)
ANION GAP: 10 (ref 5–15)
AST: 91 U/L — ABNORMAL HIGH (ref 0–37)
Alkaline Phosphatase: 100 U/L (ref 39–117)
BILIRUBIN TOTAL: 1.8 mg/dL — AB (ref 0.3–1.2)
BUN: 15 mg/dL (ref 6–23)
CHLORIDE: 122 mmol/L — AB (ref 96–112)
CO2: 26 mmol/L (ref 19–32)
Calcium: 9.9 mg/dL (ref 8.4–10.5)
Creatinine, Ser: 1.42 mg/dL — ABNORMAL HIGH (ref 0.50–1.10)
GFR calc non Af Amer: 51 mL/min — ABNORMAL LOW (ref >90)
GFR, EST AFRICAN AMERICAN: 59 mL/min — AB (ref >90)
Glucose, Bld: 123 mg/dL — ABNORMAL HIGH (ref 70–99)
POTASSIUM: 5.6 mmol/L — AB (ref 3.5–5.1)
Sodium: 158 mmol/L — ABNORMAL HIGH (ref 135–145)
Total Protein: 7.4 g/dL (ref 6.0–8.3)

## 2014-06-04 LAB — CBC WITH DIFFERENTIAL/PLATELET
BASOS PCT: 1 % (ref 0–1)
Basophils Absolute: 0.1 10*3/uL (ref 0.0–0.1)
Eosinophils Absolute: 0 10*3/uL (ref 0.0–0.7)
Eosinophils Relative: 0 % (ref 0–5)
HEMATOCRIT: 49.1 % — AB (ref 36.0–46.0)
HEMOGLOBIN: 15.6 g/dL — AB (ref 12.0–15.0)
Lymphocytes Relative: 27 % (ref 12–46)
Lymphs Abs: 2.8 10*3/uL (ref 0.7–4.0)
MCH: 29.9 pg (ref 26.0–34.0)
MCHC: 31.8 g/dL (ref 30.0–36.0)
MCV: 94.1 fL (ref 78.0–100.0)
MONO ABS: 1.5 10*3/uL — AB (ref 0.1–1.0)
Monocytes Relative: 15 % — ABNORMAL HIGH (ref 3–12)
NEUTROS PCT: 57 % (ref 43–77)
Neutro Abs: 5.8 10*3/uL (ref 1.7–7.7)
Platelets: 291 10*3/uL (ref 150–400)
RBC: 5.22 MIL/uL — AB (ref 3.87–5.11)
RDW: 13.7 % (ref 11.5–15.5)
WBC: 10.2 10*3/uL (ref 4.0–10.5)

## 2014-06-04 LAB — GLUCOSE, CAPILLARY
GLUCOSE-CAPILLARY: 118 mg/dL — AB (ref 70–99)
GLUCOSE-CAPILLARY: 118 mg/dL — AB (ref 70–99)
Glucose-Capillary: 131 mg/dL — ABNORMAL HIGH (ref 70–99)
Glucose-Capillary: 147 mg/dL — ABNORMAL HIGH (ref 70–99)
Glucose-Capillary: 108 mg/dL — ABNORMAL HIGH (ref 70–99)

## 2014-06-04 LAB — URINALYSIS, ROUTINE W REFLEX MICROSCOPIC
Bilirubin Urine: NEGATIVE
Glucose, UA: NEGATIVE mg/dL
KETONES UR: NEGATIVE mg/dL
LEUKOCYTES UA: NEGATIVE
Nitrite: NEGATIVE
Protein, ur: NEGATIVE mg/dL
Specific Gravity, Urine: 1.011 (ref 1.005–1.030)
Urobilinogen, UA: 0.2 mg/dL (ref 0.0–1.0)
pH: 5 (ref 5.0–8.0)

## 2014-06-04 LAB — URINE MICROSCOPIC-ADD ON

## 2014-06-04 LAB — OSMOLALITY, URINE: Osmolality, Ur: 311 mOsm/kg — ABNORMAL LOW (ref 390–1090)

## 2014-06-04 MED ORDER — DEXTROSE-NACL 5-0.2 % IV SOLN: INTRAVENOUS | Status: DC

## 2014-06-04 MED ORDER — DEXTROSE-NACL 5-0.2 % IV SOLN
INTRAVENOUS | Status: DC
  Administered 2014-06-04: 09:00:00 via INTRAVENOUS
  Filled 2014-06-04 (×2): qty 1000

## 2014-06-04 MED ORDER — DEXTROSE 5 % IV BOLUS
1000.0000 mL | Freq: Once | INTRAVENOUS | Status: AC
  Administered 2014-06-04: 1000 mL via INTRAVENOUS

## 2014-06-04 MED ORDER — DESMOPRESSIN ACE SPRAY REFRIG 0.01 % NA SOLN
1.0000 | Freq: Two times a day (BID) | NASAL | Status: DC
  Administered 2014-06-04 – 2014-06-06 (×5): 1 via NASAL
  Filled 2014-06-04: qty 5

## 2014-06-04 MED ORDER — DEXTROSE 5 % IV SOLN
INTRAVENOUS | Status: DC
  Administered 2014-06-04 – 2014-06-06 (×5): via INTRAVENOUS
  Administered 2014-06-06 (×2): 125 mL via INTRAVENOUS

## 2014-06-04 MED ORDER — SODIUM POLYSTYRENE SULFONATE 15 GM/60ML PO SUSP
30.0000 g | Freq: Once | ORAL | Status: AC
  Administered 2014-06-04: 30 g via ORAL
  Filled 2014-06-04: qty 120

## 2014-06-04 NOTE — Progress Notes (Signed)
Patient alert and oriented, Post op day 2.  Provided support and encouragement.  Encouraged pulmonary toilet, ambulation and small sips of liquids.  All questions answered.  Will continue to monitor. 

## 2014-06-04 NOTE — Consult Note (Signed)
[facility] Renal Consultation Note  Requesting MD: Redmond Pulling Indication for Consultation: electrolyte abnormalities and AKI   HPI:  Tiffany Roth is a 24 y.o. 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 She presented to the hospital and underwent a gastric sleeve on 06/02/2014.  Preop labs showed sodium to be 142  with a creatinine of 1.13. Postop she's had metabolic abnormalities most notably to include hypernatremia sodium of 158 and a creatinine from 1.4-1.5. Preop she was noted to drink up to a gallon of water every day likely compensating for her diabetes insipidus. However postop this is an issue as she is not able to do this.  In addition, she developed a little bit of hyperkalemia today but is not due to the potassium in her IV fluids. Hemodynamically her blood pressures been good but she has been tachycardic. She was not aware that she carried this diagnosis. She was just aware that she always would urinate a lot and drink a lot. She doesn't recall ever being on a specific medicine for this condition  CREAT  Date/Time Value Ref Range Status  03/13/2014 01:05 PM 1.03 0.50 - 1.10 mg/dL Final  12/09/2011 04:36 PM 1.03 0.50 - 1.10 mg/dL Final  11/25/2011 04:53 PM 1.25* 0.50 - 1.10 mg/dL Final   CREATININE, SER  Date/Time Value Ref Range Status  06/04/2014 05:25 AM 1.42* 0.50 - 1.10 mg/dL Final  06/03/2014 08:37 AM 1.43* 0.50 - 1.10 mg/dL Final  06/03/2014 05:24 AM 1.45* 0.50 - 1.10 mg/dL Final  05/22/2014 08:30 AM 1.13* 0.50 - 1.10 mg/dL Final  09/27/2013 09:00 AM 1.1 0.4 - 1.2 mg/dL Final  09/03/2013 12:41 PM 1.3* 0.4 - 1.2 mg/dL Final  08/27/2010 09:55 AM 1.13* 0.50 - 1.10 mg/dL Final    Comment:    **Please note change in reference range.**  08/05/2009 10:16  AM 1.0 0.4-1.2 mg/dL Final     PMHx:   Past Medical History  Diagnosis Date  . 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 Prematurity     "3 months" 1# 15 oz" ventilator   . 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 Sinus infection     recently diagnosed currently on antibiotic will have completed dosage prior to surgery   . Sleep apnea     uses CPAP   . Eczema     Past Surgical History  Procedure Laterality Date  . Varicose veins       surgically repaired 2011  . Laparoscopic gastric sleeve resection N/A 06/02/2014    Procedure: LAPAROSCOPIC GASTRIC SLEEVE RESECTION;  Surgeon: Greer Pickerel, MD;  Location: [facility];  Service: General;  Laterality: N/A;  . Upper gi endoscopy N/A 06/02/2014    Procedure: UPPER GI ENDOSCOPY;  Surgeon: Greer Pickerel, MD;  Location: [facility];  Service: General;  Laterality: N/A;    Family Hx:  Family History  Problem Relation Age of Onset  . Thyroid disease Mother   . Hypertension Mother     Social History:  reports that she has never smoked. She  has never used smokeless tobacco. She reports that she does not drink alcohol or use illicit drugs.  Allergies: No Known Allergies  Medications: Prior to Admission medications   Medication Sig Start Date End Date Taking? Authorizing Provider  amoxicillin (AMOXIL) 875 MG tablet Take 1 tablet (875 mg total) by mouth 2 (two) times daily. 05/03/14  Yes Midge Minium, MD  cetirizine (ZYRTEC) 10 MG tablet Take 1 tablet (10 mg total) by mouth daily. 05/03/14  Yes Midge Minium, MD  Emollient (AQUAPHOR EX) Apply 1 application topically daily.   Yes Historical Provider, MD  ibuprofen (ADVIL,MOTRIN) 200 MG tablet Take 200 mg by mouth every 6 (six) hours as needed (Pain).   Yes Historical  Provider, MD  Multiple Vitamin (MULTIVITAMIN WITH MINERALS) TABS tablet Take 1 tablet by mouth daily.   Yes Historical Provider, MD  triamcinolone ointment (KENALOG) 0.1 % Apply 1 application topically 2 (two) times daily. 05/03/14  Yes Midge Minium, MD    I have reviewed the patient's current medications.  Labs:  Results for orders placed or performed during the hospital encounter of 06/02/14 (from the past 48 hour(s))  Hemoglobin and hematocrit, blood     Status: None   Collection Time: 06/02/14  2:55 PM  Result Value Ref Range   Hemoglobin 14.4 12.0 - 15.0 g/dL   HCT 43.6 36.0 - 46.0 %  Glucose, capillary     Status: Abnormal   Collection Time: 06/02/14  4:58 PM  Result Value Ref Range   Glucose-Capillary 105 (H) 70 - 99 mg/dL   Comment 1 Notify RN    Comment 2 Document in Chart   Glucose, capillary     Status: Abnormal   Collection Time: 06/02/14  7:52 PM  Result Value Ref Range   Glucose-Capillary 113 (H) 70 - 99 mg/dL  Glucose, capillary     Status: None   Collection Time: 06/02/14 11:46 PM  Result Value Ref Range   Glucose-Capillary 86 70 - 99 mg/dL  Glucose, capillary     Status: Abnormal   Collection Time: 06/03/14  4:06 AM  Result Value Ref Range   Glucose-Capillary 107 (H) 70 - 99 mg/dL  CBC WITH DIFFERENTIAL     Status: Abnormal   Collection Time: 06/03/14  5:24 AM  Result Value Ref Range   WBC 16.7 (H) 4.0 - 10.5 K/uL   RBC 5.11 3.87 - 5.11 MIL/uL   Hemoglobin 15.5 (H) 12.0 - 15.0 g/dL   HCT 47.7 (H) 36.0 - 46.0 %   MCV 93.3 78.0 - 100.0 fL   MCH 30.3 26.0 - 34.0 pg   MCHC 32.5 30.0 - 36.0 g/dL   RDW 13.2 11.5 - 15.5 %   Platelets 358 150 - 400 K/uL   Neutrophils Relative % 68 43 - 77 %   Neutro Abs 11.4 (H) 1.7 - 7.7 K/uL   Lymphocytes Relative 15 12 - 46 %   Lymphs Abs 2.5 0.7 - 4.0 K/uL   Monocytes Relative 17 (H) 3 - 12 %   Monocytes Absolute 2.8 (H) 0.1 - 1.0 K/uL   Eosinophils Relative 0 0 - 5 %   Eosinophils Absolute 0.0 0.0 - 0.7 K/uL    Basophils Relative 0 0 - 1 %   Basophils Absolute 0.0 0.0 - 0.1 K/uL  Comprehensive metabolic panel     Status: Abnormal   Collection Time: 06/03/14  5:24 AM  Result Value Ref Range   Sodium 158 (H) 135 - 145 mmol/L  Potassium 4.3 3.5 - 5.1 mmol/L   Chloride 119 (H) 96 - 112 mmol/L   CO2 26 19 - 32 mmol/L   Glucose, Bld 108 (H) 70 - 99 mg/dL   BUN 16 6 - 23 mg/dL   Creatinine, Ser 1.45 (H) 0.50 - 1.10 mg/dL   Calcium 10.2 8.4 - 10.5 mg/dL   Total Protein 8.5 (H) 6.0 - 8.3 g/dL   Albumin 3.9 3.5 - 5.2 g/dL   AST 52 (H) 0 - 37 U/L   ALT 52 (H) 0 - 35 U/L   Alkaline Phosphatase 110 39 - 117 U/L   Total Bilirubin 1.0 0.3 - 1.2 mg/dL   GFR calc non Af Amer 50 (L) >90 mL/min   GFR calc Af Amer 57 (L) >90 mL/min    Comment: (NOTE) The eGFR has been calculated using the CKD EPI equation. This calculation has not been validated in all clinical situations. eGFR's persistently <90 mL/min signify possible Chronic Kidney Disease.    Anion gap 13 5 - 15  Glucose, capillary     Status: None   Collection Time: 06/03/14  7:57 AM  Result Value Ref Range   Glucose-Capillary 81 70 - 99 mg/dL  Basic metabolic panel     Status: Abnormal   Collection Time: 06/03/14  8:37 AM  Result Value Ref Range   Sodium 158 (H) 135 - 145 mmol/L   Potassium 4.2 3.5 - 5.1 mmol/L   Chloride 121 (H) 96 - 112 mmol/L   CO2 26 19 - 32 mmol/L   Glucose, Bld 114 (H) 70 - 99 mg/dL   BUN 16 6 - 23 mg/dL   Creatinine, Ser 1.43 (H) 0.50 - 1.10 mg/dL   Calcium 10.2 8.4 - 10.5 mg/dL   GFR calc non Af Amer 50 (L) >90 mL/min   GFR calc Af Amer 58 (L) >90 mL/min    Comment: (NOTE) The eGFR has been calculated using the CKD EPI equation. This calculation has not been validated in all clinical situations. eGFR's persistently <90 mL/min signify possible Chronic Kidney Disease.    Anion gap 11 5 - 15  Glucose, capillary     Status: None   Collection Time: 06/03/14 12:00 PM  Result Value Ref Range   Glucose-Capillary 81  70 - 99 mg/dL  Hemoglobin and hematocrit, blood     Status: Abnormal   Collection Time: 06/03/14  4:09 PM  Result Value Ref Range   Hemoglobin 15.3 (H) 12.0 - 15.0 g/dL   HCT 48.1 (H) 36.0 - 46.0 %  Glucose, capillary     Status: None   Collection Time: 06/03/14  4:13 PM  Result Value Ref Range   Glucose-Capillary 96 70 - 99 mg/dL  Glucose, capillary     Status: Abnormal   Collection Time: 06/03/14  8:20 PM  Result Value Ref Range   Glucose-Capillary 100 (H) 70 - 99 mg/dL  Glucose, capillary     Status: Abnormal   Collection Time: 06/03/14 11:51 PM  Result Value Ref Range   Glucose-Capillary 108 (H) 70 - 99 mg/dL  Glucose, capillary     Status: Abnormal   Collection Time: 06/04/14  4:02 AM  Result Value Ref Range   Glucose-Capillary 131 (H) 70 - 99 mg/dL  CBC with Differential     Status: Abnormal   Collection Time: 06/04/14  5:25 AM  Result Value Ref Range   WBC 10.2 4.0 - 10.5 K/uL   RBC 5.22 (H) 3.87 - 5.11 MIL/uL  Hemoglobin 15.6 (H) 12.0 - 15.0 g/dL   HCT 49.1 (H) 36.0 - 46.0 %   MCV 94.1 78.0 - 100.0 fL   MCH 29.9 26.0 - 34.0 pg   MCHC 31.8 30.0 - 36.0 g/dL   RDW 13.7 11.5 - 15.5 %   Platelets 291 150 - 400 K/uL   Neutrophils Relative % 57 43 - 77 %   Lymphocytes Relative 27 12 - 46 %   Monocytes Relative 15 (H) 3 - 12 %   Eosinophils Relative 0 0 - 5 %   Basophils Relative 1 0 - 1 %   Neutro Abs 5.8 1.7 - 7.7 K/uL   Lymphs Abs 2.8 0.7 - 4.0 K/uL   Monocytes Absolute 1.5 (H) 0.1 - 1.0 K/uL   Eosinophils Absolute 0.0 0.0 - 0.7 K/uL   Basophils Absolute 0.1 0.0 - 0.1 K/uL   WBC Morphology WHITE COUNT CONFIRMED ON SMEAR    Smear Review PLATELET COUNT CONFIRMED BY SMEAR   Comprehensive metabolic panel     Status: Abnormal   Collection Time: 06/04/14  5:25 AM  Result Value Ref Range   Sodium 158 (H) 135 - 145 mmol/L   Potassium 5.6 (H) 3.5 - 5.1 mmol/L    Comment: DELTA CHECK NOTED SLIGHT HEMOLYSIS    Chloride 122 (H) 96 - 112 mmol/L   CO2 26 19 - 32 mmol/L    Glucose, Bld 123 (H) 70 - 99 mg/dL   BUN 15 6 - 23 mg/dL   Creatinine, Ser 1.42 (H) 0.50 - 1.10 mg/dL   Calcium 9.9 8.4 - 10.5 mg/dL   Total Protein 7.4 6.0 - 8.3 g/dL   Albumin 3.6 3.5 - 5.2 g/dL   AST 91 (H) 0 - 37 U/L   ALT 96 (H) 0 - 35 U/L   Alkaline Phosphatase 100 39 - 117 U/L   Total Bilirubin 1.8 (H) 0.3 - 1.2 mg/dL   GFR calc non Af Amer 51 (L) >90 mL/min   GFR calc Af Amer 59 (L) >90 mL/min    Comment: (NOTE) The eGFR has been calculated using the CKD EPI equation. This calculation has not been validated in all clinical situations. eGFR's persistently <90 mL/min signify possible Chronic Kidney Disease.    Anion gap 10 5 - 15  Glucose, capillary     Status: Abnormal   Collection Time: 06/04/14  7:33 AM  Result Value Ref Range   Glucose-Capillary 118 (H) 70 - 99 mg/dL   Comment 1 Notify RN   Glucose, capillary     Status: Abnormal   Collection Time: 06/04/14 11:35 AM  Result Value Ref Range   Glucose-Capillary 147 (H) 70 - 99 mg/dL   Comment 1 Notify RN      ROS:  A comprehensive review of systems was negative except for: Ears, nose, mouth, throat, and face: positive for Thirst  Physical Exam: Filed Vitals:   06/04/14 0550  BP: 126/79  Pulse: 121  Temp: 98.7 F (37.1 C)  Resp: 18     General: obese, pleasant BF NAD HEENT: Pupils are equal round and reactive to light, extra motions are intact, mucous membranes are dry Neck: There is no jugular venous distention Heart: Tachycardic but regular Lungs: Clear to auscultation bilaterally Abdomen: Obese, postop Extremities: No peripheral edema Skin: Warm and dry Neuro: Alert and oriented. The remainder of neurologic exam is nonfocal  Assessment/Plan: 24 year old black female who carries a 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 It seems that she was able  to compensate previously by just increasing water intake. For now this is going to be an issue that she's not able to do this. 1.Renal- I  suspect her mild AK I secondary to volume depletion. I will check a urinalysis also to evaluate. She needs repletion of free water. The only way we can do this now is intravenously using D5W. I will give her a liter  bolus and 125/h and reassess in the a.m. Water deficit is likely more than this and she will need more water. Hopefully this will not lead her glucose to be too high 2. Hypernatremia  - due to DI. It appears central in nature given history. Previously she was able to compensate by just drinking more water. This will not be possible in the perioperative phase. I'm going to try nasal DDAVP to see if that helps to correct  hypoosmolar urine- will check urine osmolality pre-and post-DDAVP.  If this is successful she will likely need to go home on this therapy   3.hyperkalemia - was isolated incident due to the potassium and IV fluids. That is been stopped. I anticipate it will correct  Thank you for this interesting consult. I will continue to follow with you  GOLDSBOROUGH,KELLIE A 06/04/2014, 11:40 AM

## 2014-06-04 NOTE — Progress Notes (Signed)
2 Days Post-Op  Subjective: Denies n/v/regurgitation. Minimal abd pain. States that she wants water more frequently. ambulating  Objective: Vital signs in last 24 hours: Temp:  [98.4 F (36.9 C)-99.3 F (37.4 C)] 98.7 F (37.1 C) (03/30 0550) Pulse Rate:  [121-129] 121 (03/30 0550) Resp:  [18] 18 (03/30 0550) BP: (121-129)/(67-82) 126/79 mmHg (03/30 0550) SpO2:  [98 %-100 %] 98 % (03/30 0550) Weight:  [117.663 kg (259 lb 6.4 oz)] 117.663 kg (259 lb 6.4 oz) (03/30 0615) Last BM Date: 06/01/14  Intake/Output from previous day: 03/29 0701 - 03/30 0700 In: 2142.1 [P.O.:240; I.V.:1902.1] Out: 750 [Urine:750] Intake/Output this shift:    Alert, nontoxic cta b/l Tachy Soft, obese, incision c/d/i; min TTP No edema   Lab Results:   Recent Labs  06/03/14 0524 06/03/14 1609 06/04/14 0525  WBC 16.7*  --  10.2  HGB 15.5* 15.3* 15.6*  HCT 47.7* 48.1* 49.1*  PLT 358  --  291   BMET  Recent Labs  06/03/14 0837 06/04/14 0525  NA 158* 158*  K 4.2 5.6*  CL 121* 122*  CO2 26 26  GLUCOSE 114* 123*  BUN 16 15  CREATININE 1.43* 1.42*  CALCIUM 10.2 9.9   PT/INR No results for input(s): LABPROT, INR in the last 72 hours. ABG No results for input(s): PHART, HCO3 in the last 72 hours.  Invalid input(s): PCO2, PO2  Studies/Results: Dg Ugi W/water Sol Cm  06/03/2014   CLINICAL DATA:  Status post gastric sleeve procedure.  EXAM: WATER SOLUBLE UPPER GI SERIES  TECHNIQUE: Single-column upper GI series was performed using water soluble contrast.  CONTRAST:  71mL OMNIPAQUE IOHEXOL 300 MG/ML  SOLN  COMPARISON:  None.  FLUOROSCOPY TIME:  Radiation Exposure Index (as provided by the fluoroscopic device):  If the device does not provide the exposure index:  Fluoroscopy Time (in minutes and seconds):  1 minutes, 14 seconds  Number of Acquired Images:  6  FINDINGS: Upon the initial swallowing of a 25 cc bolus, there is some to and fro motion in the esophagus with eventual filling of the  stomach. A second 25 cc bolus was administered. No leak or complicating feature associated with the gastric sleeve is identified. Duodenum unremarkable.  IMPRESSION: 1. No leak or complicating feature associated with gastric sleeve identified.   Electronically Signed   By: Van Clines M.D.   On: 06/03/2014 10:54    Anti-infectives: Anti-infectives    Start     Dose/Rate Route Frequency Ordered Stop   06/02/14 0759  cefoTEtan in Dextrose 5% (CEFOTAN) IVPB 2 g     2 g Intravenous On call to O.R. 06/02/14 0759 06/02/14 1015      Assessment/Plan: s/p Procedure(s): LAPAROSCOPIC GASTRIC SLEEVE RESECTION (N/A) UPPER GI ENDOSCOPY (N/A)  Appears to be tolerating POD 1 diet; no fever, nml wbc - adv to POD2 Elevated Cr - uop ok. Cr stable. Prerenal? Hypernatremia - Na stable at 158. Pt normally drinks a gallon of water per day. Despite changing IVF Na level unchanged. Given Cr and hypernatremia - will consult renal for guidance Hyperkalemia - remove potassium from IVF. Kayexalate.  VTE prophylaxis - no bleeding; continue chemical vte prophylaxis Elevated transaminases - have been elevated in past, liver retraction during surgery, follow  Leighton Ruff. Redmond Pulling, MD, FACS General, Bariatric, & Minimally Invasive Surgery [facility], [place]   LOS: 2 days    Gayland Curry 06/04/2014

## 2014-06-04 NOTE — Progress Notes (Signed)
Pt already on CPAP when RT arrived to room.  Current settings are Auto CPAP 5-20 CMH20 via FFM, Pt tolerating well at this time.  RT to monitor and assess as needed.

## 2014-06-05 DIAGNOSIS — E232 Diabetes insipidus: Secondary | ICD-10-CM | POA: Diagnosis present

## 2014-06-05 LAB — COMPREHENSIVE METABOLIC PANEL
ALBUMIN: 3.3 g/dL — AB (ref 3.5–5.2)
ALT: 73 U/L — AB (ref 0–35)
AST: 51 U/L — ABNORMAL HIGH (ref 0–37)
Alkaline Phosphatase: 91 U/L (ref 39–117)
Anion gap: 9 (ref 5–15)
BUN: 16 mg/dL (ref 6–23)
CALCIUM: 9 mg/dL (ref 8.4–10.5)
CO2: 27 mmol/L (ref 19–32)
CREATININE: 1.43 mg/dL — AB (ref 0.50–1.10)
Chloride: 111 mmol/L (ref 96–112)
GFR calc Af Amer: 58 mL/min — ABNORMAL LOW (ref >90)
GFR calc non Af Amer: 50 mL/min — ABNORMAL LOW (ref >90)
GLUCOSE: 119 mg/dL — AB (ref 70–99)
POTASSIUM: 3.3 mmol/L — AB (ref 3.5–5.1)
Sodium: 147 mmol/L — ABNORMAL HIGH (ref 135–145)
Total Bilirubin: 1.3 mg/dL — ABNORMAL HIGH (ref 0.3–1.2)
Total Protein: 7.2 g/dL (ref 6.0–8.3)

## 2014-06-05 LAB — OSMOLALITY, URINE
Osmolality, Ur: 512 mOsm/kg (ref 390–1090)
Osmolality, Ur: 611 mOsm/kg (ref 390–1090)

## 2014-06-05 LAB — CBC
HCT: 44.6 % (ref 36.0–46.0)
HEMOGLOBIN: 14.1 g/dL (ref 12.0–15.0)
MCH: 29.9 pg (ref 26.0–34.0)
MCHC: 31.6 g/dL (ref 30.0–36.0)
MCV: 94.7 fL (ref 78.0–100.0)
Platelets: 272 10*3/uL (ref 150–400)
RBC: 4.71 MIL/uL (ref 3.87–5.11)
RDW: 13 % (ref 11.5–15.5)
WBC: 11.6 10*3/uL — AB (ref 4.0–10.5)

## 2014-06-05 LAB — GLUCOSE, CAPILLARY
GLUCOSE-CAPILLARY: 104 mg/dL — AB (ref 70–99)
GLUCOSE-CAPILLARY: 130 mg/dL — AB (ref 70–99)
GLUCOSE-CAPILLARY: 88 mg/dL (ref 70–99)
GLUCOSE-CAPILLARY: 93 mg/dL (ref 70–99)
GLUCOSE-CAPILLARY: 95 mg/dL (ref 70–99)
Glucose-Capillary: 106 mg/dL — ABNORMAL HIGH (ref 70–99)
Glucose-Capillary: 98 mg/dL (ref 70–99)

## 2014-06-05 MED ORDER — DEXTROSE 5 % IV BOLUS
1000.0000 mL | Freq: Once | INTRAVENOUS | Status: AC
  Administered 2014-06-05: 1000 mL via INTRAVENOUS

## 2014-06-05 NOTE — Progress Notes (Signed)
Subjective:  With DDAVP patient says that UOP has gone down and sodium has gone down as well- urine osm increased with DDAVP which is what is supposed to happen.   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 It seems that she was able to compensate previously by just increasing water intake. 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 U/A is negative. She needs repletion of free water. The only way we can do this now is intravenously using D5W. I will give her another liter bolus and 125/h.  This is mainly correcting her water deficit since surgery.  2. Hypernatremia - due to DI. It appears central in nature given history. Previously she was able to compensate by just drinking more water. This will not be possible in the perioperative phase. The  DDAVP increased her urine osm so should decrease her UOP so that she wont need to replete so much by drinking fluids.  This will need to be continued as OP 3.hyperkalemia - resolved 4. Dispo- I am going to continue to give high dose water today, again to correct past water deficit.  With the DDAVP on board for the future I am hoping she will need less water to get by.  I think would be OK to go home tomorrow- would need lab checks to monitor and if needed could bring her back to short stay  for D5 W bolus.  How long will this fluid restriction be in place ???     Tiffany Roth A    Labs: Basic Metabolic Panel:  Recent Labs Lab 06/03/14 0837 06/04/14 0525 06/05/14 0440  NA 158* 158* 147*  K 4.2 5.6* 3.3*  CL 121* 122* 111  CO2 26 26 27   GLUCOSE 114* 123* 119*  BUN 16 15 16   CREATININE 1.43* 1.42* 1.43*  CALCIUM 10.2 9.9 9.0   Liver Function Tests:  Recent Labs Lab 06/03/14 0524 06/04/14 0525 06/05/14 0440  AST 52* 91* 51*  ALT 52* 96* 73*  ALKPHOS 110 100 91  BILITOT 1.0 1.8* 1.3*  PROT 8.5* 7.4 7.2  ALBUMIN 3.9 3.6 3.3*   No results for input(s): LIPASE, AMYLASE in the last 168 hours. No results for input(s): AMMONIA in the last 168 hours. CBC:  Recent Labs Lab 06/03/14 0524 06/03/14 1609 06/04/14 0525 06/05/14 0440  WBC 16.7*  --  10.2 11.6*  NEUTROABS 11.4*  --  5.8  --   HGB 15.5* 15.3* 15.6* 14.1  HCT 47.7* 48.1* 49.1* 44.6  MCV 93.3  --  94.1 94.7  PLT 358  --  291 272   Cardiac Enzymes: No results for input(s): CKTOTAL, CKMB, CKMBINDEX, TROPONINI in  the last 168 hours. CBG:  Recent Labs Lab 06/04/14 1601 06/04/14 1944 06/05/14 0005 06/05/14 0407 06/05/14 0751  GLUCAP 108* 118* 130* 93 106*    Iron Studies: No results for input(s): IRON, TIBC, TRANSFERRIN, FERRITIN in the last 72 hours. Studies/Results: No results found. Medications: Infusions: . dextrose 125 mL/hr at 06/05/14 0725    Scheduled Medications: . desmopressin  1 spray Nasal BID  . enoxaparin (LOVENOX) injection  30 mg Subcutaneous Q12H  . insulin aspart  0-15 Units Subcutaneous 6 times per day  . pantoprazole (PROTONIX) IV  40 mg Intravenous QHS  . protein supplement  2 oz Oral QID   Or  . protein supplement  2 oz Oral QID   Or  . protein supplement  2 oz Oral QID    have reviewed scheduled and prn medications.  Physical Exam: General: NAD Heart: RRR Lungs: mostly clear Abdomen: soft, non tender Extremities: no  edema    06/05/2014,11:46 AM  LOS: 3 days

## 2014-06-05 NOTE — Progress Notes (Signed)
Patient alert and oriented, pain is controlled. Patient is tolerating fluids,  advanced to protein shake yesterday patient tolerated well.  Patient discharge is delayed due to DI, will continue to monitor. Reviewed Gastric sleeve discharge instructions with patient and patient is able to articulate understanding.  Provided information on BELT program, Support Group and [facility] outpatient pharmacy. All questions answered, will continue to monitor.

## 2014-06-05 NOTE — Discharge Instructions (Signed)

## 2014-06-05 NOTE — Progress Notes (Signed)
3 Days Post-Op  Subjective: Kayexalate tasted bad other than that denies n/v/reflux/cramps. Ambulating. Min abd pain  Objective: Vital signs in last 24 hours: Temp:  [97.6 F (36.4 C)-98.6 F (37 C)] 97.6 F (36.4 C) (03/31 0600) Pulse Rate:  [100-125] 100 (03/31 0600) Resp:  [18-22] 18 (03/31 0600) BP: (109-132)/(64-77) 115/72 mmHg (03/31 0600) SpO2:  [96 %-100 %] 99 % (03/31 0600) Last BM Date: 06/01/14  Intake/Output from previous day: 03/30 0701 - 03/31 0700 In: 2870 [P.O.:120; I.V.:1750; IV Piggyback:1000] Out: -  Intake/Output this shift: Total I/O In: 16 [P.O.:60] Out: -   Alert, nad, nontoxic cta b/l Reg  Soft, obese, ND, incisions c/d/i  Lab Results:   Recent Labs  06/04/14 0525 06/05/14 0440  WBC 10.2 11.6*  HGB 15.6* 14.1  HCT 49.1* 44.6  PLT 291 272   BMET  Recent Labs  06/04/14 0525 06/05/14 0440  NA 158* 147*  K 5.6* 3.3*  CL 122* 111  CO2 26 27  GLUCOSE 123* 119*  BUN 15 16  CREATININE 1.42* 1.43*  CALCIUM 9.9 9.0   PT/INR No results for input(s): LABPROT, INR in the last 72 hours. ABG No results for input(s): PHART, HCO3 in the last 72 hours.  Invalid input(s): PCO2, PO2  Studies/Results: Dg Ugi W/water Sol Cm  06/03/2014   CLINICAL DATA:  Status post gastric sleeve procedure.  EXAM: WATER SOLUBLE UPPER GI SERIES  TECHNIQUE: Single-column upper GI series was performed using water soluble contrast.  CONTRAST:  76mL OMNIPAQUE IOHEXOL 300 MG/ML  SOLN  COMPARISON:  None.  FLUOROSCOPY TIME:  Radiation Exposure Index (as provided by the fluoroscopic device):  If the device does not provide the exposure index:  Fluoroscopy Time (in minutes and seconds):  1 minutes, 14 seconds  Number of Acquired Images:  6  FINDINGS: Upon the initial swallowing of a 25 cc bolus, there is some to and fro motion in the esophagus with eventual filling of the stomach. A second 25 cc bolus was administered. No leak or complicating feature associated with the  gastric sleeve is identified. Duodenum unremarkable.  IMPRESSION: 1. No leak or complicating feature associated with gastric sleeve identified.   Electronically Signed   By: Van Clines M.D.   On: 06/03/2014 10:54    Anti-infectives: Anti-infectives    Start     Dose/Rate Route Frequency Ordered Stop   06/02/14 0759  cefoTEtan in Dextrose 5% (CEFOTAN) IVPB 2 g     2 g Intravenous On call to O.R. 06/02/14 0759 06/02/14 1015      Assessment/Plan: s/p Procedure(s): LAPAROSCOPIC GASTRIC SLEEVE RESECTION (N/A) UPPER GI ENDOSCOPY (N/A)  Doing well from bariatric surgery. Tolerating diet. No fever. Will let pt have bariatric full liquid diet and water ad lib. Reinforced to pt that she will need to listen to her gut and if have cramps/fullness, needs to slow down with po  Hypernatremia - better Elevated creatinine - stable Hyperkalemia - resolved DI - greatly appreciate renal input and management. dispo to home will depend a lot on renal's input. prob home tomorrow.  Cont VTE prophylaxis CPAP for OSA Blood sugars ok  Leighton Ruff. Redmond Pulling, MD, FACS General, Bariatric, & Minimally Invasive Surgery [facility], [place]   LOS: 3 days    Gayland Curry 06/05/2014

## 2014-06-06 LAB — RENAL FUNCTION PANEL
ALBUMIN: 2.9 g/dL — AB (ref 3.5–5.2)
Anion gap: 8 (ref 5–15)
BUN: 15 mg/dL (ref 6–23)
CO2: 25 mmol/L (ref 19–32)
Calcium: 8.5 mg/dL (ref 8.4–10.5)
Chloride: 103 mmol/L (ref 96–112)
Creatinine, Ser: 1.12 mg/dL — ABNORMAL HIGH (ref 0.50–1.10)
GFR calc non Af Amer: 68 mL/min — ABNORMAL LOW (ref >90)
GFR, EST AFRICAN AMERICAN: 78 mL/min — AB (ref >90)
Glucose, Bld: 112 mg/dL — ABNORMAL HIGH (ref 70–99)
POTASSIUM: 3.8 mmol/L (ref 3.5–5.1)
Phosphorus: 4.3 mg/dL (ref 2.3–4.6)
Sodium: 136 mmol/L (ref 135–145)

## 2014-06-06 LAB — GLUCOSE, CAPILLARY
GLUCOSE-CAPILLARY: 111 mg/dL — AB (ref 70–99)
GLUCOSE-CAPILLARY: 90 mg/dL (ref 70–99)
Glucose-Capillary: 104 mg/dL — ABNORMAL HIGH (ref 70–99)

## 2014-06-06 MED ORDER — ONDANSETRON 4 MG PO TBDP: 4.0000 mg | ORAL_TABLET | Freq: Three times a day (TID) | ORAL | Status: DC | PRN

## 2014-06-06 MED ORDER — OXYCODONE HCL 5 MG/5ML PO SOLN: 5.0000 mg | ORAL | Status: DC | PRN

## 2014-06-06 MED ORDER — DESMOPRESSIN ACE SPRAY REFRIG 0.01 % NA SOLN: 1.0000 | Freq: Two times a day (BID) | NASAL | Status: DC

## 2014-06-06 MED ORDER — ALUM & MAG HYDROXIDE-SIMETH 200-200-20 MG/5ML PO SUSP: 15.0000 mL | Freq: Four times a day (QID) | ORAL | Status: DC | PRN

## 2014-06-06 NOTE — Progress Notes (Signed)
Subjective:  Sodium and creatinine WNL- she feels fine- ready for discharge today  Objective Vital signs in last 24 hours: Filed Vitals:   06/05/14 1420 06/05/14 2153 06/06/14 0612 06/06/14 0944  BP: 126/80 120/78 118/64 141/67  Pulse: 107 106 102 102  Temp: 98.6 F (37 C) 98.5 F (36.9 C) 98.6 F (37 C) 99.1 F (37.3 C)  TempSrc: Oral Oral Oral Oral  Resp: 18 18 18 16   Height:      Weight:      SpO2: 97% 98% 98% 98%   Weight change:   Intake/Output Summary (Last 24 hours) at 06/06/14 1240 Last data filed at 06/05/14 1743  Gross per 24 hour  Intake 1436.25 ml  Output      0 ml  Net 1436.25 ml   Assessment/Plan: 24 year old black female who carries a 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 It seems that she was able to compensate previously by just increasing water intake. 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 U/A is negative. No wis normal 2. Hypernatremia - due to DI. It appears central in nature given history. Previously she was able to compensate by just drinking more water. This will not be possible in the perioperative phase. The  DDAVP increased her urine osm so should decrease her UOP so that she wont need to replete so much by drinking fluids.  This will need to be continued as OP- I did tell her that she can drink water but may be limited in volume by abdominal symptoms.  Hopefully with the addition of DDAVP she will be able to keep up with her water needs 3.hyperkalemia - resolved 4. Dispo- Stop IVF- OK to go home on DDAVP- i wrote the script.  Working on getting labs done next Monday or Tuesday thru our office and follow up with me- eventually will be able to be managed by her PCP   Khamarion Bjelland A    Labs: Basic Metabolic Panel:  Recent Labs Lab 06/04/14 0525 06/05/14 0440 06/06/14 0515  NA 158* 147* 136  K 5.6* 3.3* 3.8  CL 122* 111 103  CO2 26 27 25    GLUCOSE 123* 119* 112*  BUN 15 16 15   CREATININE 1.42* 1.43* 1.12*  CALCIUM 9.9 9.0 8.5  PHOS  --   --  4.3   Liver Function Tests:  Recent Labs Lab 06/03/14 0524 06/04/14 0525 06/05/14 0440 06/06/14 0515  AST 52* 91* 51*  --   ALT 52* 96* 73*  --   ALKPHOS 110 100 91  --   BILITOT 1.0 1.8* 1.3*  --   PROT 8.5* 7.4 7.2  --   ALBUMIN 3.9 3.6 3.3* 2.9*   No results for input(s): LIPASE, AMYLASE in the last 168 hours. No results for input(s): AMMONIA in the last 168 hours. CBC:  Recent Labs Lab 06/03/14 0524 06/03/14 1609 06/04/14 0525 06/05/14 0440  WBC 16.7*  --  10.2 11.6*  NEUTROABS 11.4*  --  5.8  --   HGB 15.5* 15.3* 15.6* 14.1  HCT 47.7* 48.1* 49.1* 44.6  MCV 93.3  --  94.1 94.7  PLT 358  --  291 272   Cardiac Enzymes: No results for input(s): CKTOTAL, CKMB, CKMBINDEX, TROPONINI in the last 168 hours. CBG:  Recent Labs Lab 06/05/14 1957 06/05/14 2351 06/06/14 0414 06/06/14 0756 06/06/14 1149  GLUCAP 98 104* 111* 90 104*    Iron Studies: No results for  input(s): IRON, TIBC, TRANSFERRIN, FERRITIN in the last 72 hours. Studies/Results: No results found. Medications: Infusions:    Scheduled Medications: . desmopressin  1 spray Nasal BID  . enoxaparin (LOVENOX) injection  30 mg Subcutaneous Q12H  . insulin aspart  0-15 Units Subcutaneous 6 times per day  . pantoprazole (PROTONIX) IV  40 mg Intravenous QHS  . protein supplement  2 oz Oral QID   Or  . protein supplement  2 oz Oral QID   Or  . protein supplement  2 oz Oral QID    have reviewed scheduled and prn medications.  Physical Exam: General: NAD Heart: RRR Lungs: mostly clear Abdomen: soft, non tender Extremities: no edema    06/06/2014,12:40 PM  LOS: 4 days

## 2014-06-06 NOTE — Discharge Summary (Signed)
Physician Discharge Summary  Tiffany Roth LDJ:[id number] DOB: 06/12/1990 DOA: 06/02/2014  PCP: Lottie Dawson, MD  Admit date: 06/02/2014 Discharge date: 06/06/2014  Recommendations for Outpatient Follow-up:    Follow-up Information    Follow up with Gayland Curry, MD. Go on 06/19/2014.   Specialty:  General Surgery   Why:  For Post-Op Check 2:45 PM   Contact information:   [address] [place] [address] [phone]      Discharge Diagnoses:  Active Problems:   OSA (obstructive sleep apnea)   Morbid obesity with BMI of 50.0-59.9, adult   Prediabetes   S/P laparoscopic sleeve gastrectomy   Diabetes insipidus   Hypernatremia - resolved  Elevated Creatinine - resolved  Volume depletion -resolved  Surgical Procedure: Laparoscopic Sleeve Gastrectomy, upper endoscopy  Consults: Renal  Discharge Condition: Good Disposition: Home  Diet recommendation: Postoperative sleeve gastrectomy diet (liquids only)  Filed Weights   06/02/14 0800 06/03/14 0753 06/04/14 0615  Weight: 122.074 kg (269 lb 2 oz) 119.296 kg (263 lb) 117.663 kg (259 lb 6.4 oz)     Hospital Course:  The patient was admitted for a planned laparoscopic sleeve gastrectomy. Please see operative note. Preoperatively the patient was given 5000 units of subcutaneous heparin for DVT prophylaxis and she was noted to have a preoperative heart rate of 120 the morning of surgery. Postoperative prophylactic Lovenox dosing was started on the morning of postoperative day 1. The patient underwent an upper GI on postoperative day 1 which demonstrated no extravasation of contrast and emptying of the contrast into the small bowel. The patient was started on ice chips and water which they tolerated. Postoperatively she became hypernatremic with a sodium of around 158 and an elevated creatinine of around 1.45.  I changed her IV fluids around however her lab abnormalities persisted on to postoperative day 2.  On  postoperative day 2 The patient's diet was advanced to protein shakes which they also tolerated. Her tachycardia also remained postoperatively.  Her father had mentioned that she had an urge to drink gallon of water every day preoperatively.  Because I was concerned that she may have some type of underlying diabetes insipidus I consulted renal.  She was placed on D5 W. It was felt her elevated creatinine was due to volume depletion.  She was kept in the hospital until her electrolytes normalized.  She had also been started on nasal DDAVP.  Her parents did confirm that she had been diagnosed with diabetes insipidus as an infant however they state they were told that she would probably grow out of it.  Her sodium levels have been normal preoperatively probably because she had compensated by her large volume of water intake. The patient was ambulating without difficulty. Their vital signs are stable without fever. Their hemoglobin had remained stable. The patient was maintained on their home settings for CPAP therapy. The patient had received discharge instructions and counseling. They were deemed stable for discharge.  She will be going home on DDAVP.  She has no volume restriction with respect to her postoperative diet however, she may be physically limited by how much she can drink.  We will set her up to have lab work done next week to monitor her electrolytes.   Discharge Instructions      Discharge Instructions    Ambulate hourly while awake    Complete by:  As directed      Call MD for:  difficulty breathing, headache or visual disturbances  Complete by:  As directed      Call MD for:  persistant dizziness or light-headedness    Complete by:  As directed      Call MD for:  persistant nausea and vomiting    Complete by:  As directed      Call MD for:  redness, tenderness, or signs of infection (pain, swelling, redness, odor or green/yellow discharge around incision site)    Complete by:  As  directed      Call MD for:  severe uncontrolled pain    Complete by:  As directed      Call MD for:  temperature >101 F    Complete by:  As directed      Diet bariatric full liquid    Complete by:  As directed      Discharge instructions    Complete by:  As directed   See Bariatric Surgery Discharge instructions Pick up DDAVP nasal spray prescription Get labs drawn next week - my office will call you later today with details.     Incentive spirometry    Complete by:  As directed   Perform hourly while awake            Medication List    STOP taking these medications        amoxicillin 875 MG tablet  Commonly known as:  AMOXIL     ibuprofen 200 MG tablet  Commonly known as:  ADVIL,MOTRIN      TAKE these medications        alum & mag hydroxide-simeth 200-200-20 MG/5ML suspension  Commonly known as:  MAALOX/MYLANTA  Take 15 mLs by mouth every 6 (six) hours as needed for indigestion or heartburn.     AQUAPHOR EX  Apply 1 application topically daily.     cetirizine 10 MG tablet  Commonly known as:  ZYRTEC  Take 1 tablet (10 mg total) by mouth daily.     desmopressin 0.01 % Soln  Commonly known as:  DDAVP  Place 1 spray into the nose 2 (two) times daily.     multivitamin with minerals Tabs tablet  Take 1 tablet by mouth daily.     ondansetron 4 MG disintegrating tablet  Commonly known as:  ZOFRAN ODT  Take 1 tablet (4 mg total) by mouth every 8 (eight) hours as needed for nausea or vomiting.     oxyCODONE 5 MG/5ML solution  Commonly known as:  ROXICODONE  Take 5-10 mLs (5-10 mg total) by mouth every 4 (four) hours as needed for moderate pain or severe pain.     triamcinolone ointment 0.1 %  Commonly known as:  KENALOG  Apply 1 application topically 2 (two) times daily.       Follow-up Information    Follow up with Gayland Curry, MD. Go on 06/19/2014.   Specialty:  General Surgery   Why:  For Post-Op Check 2:45 PM   Contact information:   [place] [address] [phone]        The results of significant diagnostics from this hospitalization (including imaging, microbiology, ancillary and laboratory) are listed below for reference.    Significant Diagnostic Studies: Dg Ugi W/water Sol Cm  06/03/2014   CLINICAL DATA:  Status post gastric sleeve procedure.  EXAM: WATER SOLUBLE UPPER GI SERIES  TECHNIQUE: Single-column upper GI series was performed using water soluble contrast.  CONTRAST:  23mL OMNIPAQUE IOHEXOL 300 MG/ML  SOLN  COMPARISON:  None.  FLUOROSCOPY TIME:  Radiation Exposure Index (as provided by the fluoroscopic device):  If the device does not provide the exposure index:  Fluoroscopy Time (in minutes and seconds):  1 minutes, 14 seconds  Number of Acquired Images:  6  FINDINGS: Upon the initial swallowing of a 25 cc bolus, there is some to and fro motion in the esophagus with eventual filling of the stomach. A second 25 cc bolus was administered. No leak or complicating feature associated with the gastric sleeve is identified. Duodenum unremarkable.  IMPRESSION: 1. No leak or complicating feature associated with gastric sleeve identified.   Electronically Signed   By: Van Clines M.D.   On: 06/03/2014 10:54    Labs: Basic Metabolic Panel:  Recent Labs Lab 06/03/14 0524 06/03/14 0837 06/04/14 0525 06/05/14 0440 06/06/14 0515  NA 158* 158* 158* 147* 136  K 4.3 4.2 5.6* 3.3* 3.8  CL 119* 121* 122* 111 103  CO2 26 26 26 27 25   GLUCOSE 108* 114* 123* 119* 112*  BUN 16 16 15 16 15   CREATININE 1.45* 1.43* 1.42* 1.43* 1.12*  CALCIUM 10.2 10.2 9.9 9.0 8.5  PHOS  --   --   --   --  4.3   Liver Function Tests:  Recent Labs Lab 06/03/14 0524 06/04/14 0525 06/05/14 0440 06/06/14 0515  AST 52* 91* 51*  --   ALT 52* 96* 73*  --   ALKPHOS 110 100 91  --   BILITOT 1.0 1.8* 1.3*  --   PROT 8.5* 7.4 7.2  --   ALBUMIN 3.9 3.6 3.3* 2.9*    CBC:  Recent Labs Lab 06/02/14 1455  06/03/14 0524 06/03/14 1609 06/04/14 0525 06/05/14 0440  WBC  --  16.7*  --  10.2 11.6*  NEUTROABS  --  11.4*  --  5.8  --   HGB 14.4 15.5* 15.3* 15.6* 14.1  HCT 43.6 47.7* 48.1* 49.1* 44.6  MCV  --  93.3  --  94.1 94.7  PLT  --  358  --  291 272    CBG:  Recent Labs Lab 06/05/14 1957 06/05/14 2351 06/06/14 0414 06/06/14 0756 06/06/14 1149  GLUCAP 98 104* 111* 90 104*    Active Problems:   OSA (obstructive sleep apnea)   Morbid obesity with BMI of 50.0-59.9, adult   Prediabetes   S/P laparoscopic sleeve gastrectomy   Diabetes insipidus   Time coordinating discharge: 25 minutes  Signed:  Gayland Curry, MD [facility], [place] [phone] 06/06/2014, 12:22 PM

## 2014-06-10 ENCOUNTER — Ambulatory Visit: Payer: BC Managed Care – PPO

## 2014-06-10 ENCOUNTER — Telehealth (HOSPITAL_COMMUNITY): Payer: Self-pay

## 2014-06-10 NOTE — Telephone Encounter (Signed)
Made discharge phone call to patient per DROP protocol. Asking the following questions.    1. Do you have someone to care for you now that you are home?  yes 2. Are you having pain now that is not relieved by your pain medication?  yes 3. Are you able to drink the recommended daily amount of fluids (48 ounces minimum/day) and protein (60-80 grams/day) as prescribed by the dietitian or nutritional counselor?  yes 4. Are you taking the vitamins and minerals as prescribed?  yes 5. Do you have the "on call" number to contact your surgeon if you have a problem or question?  yes 6. Are your incisions free of redness, swelling or drainage? (If steri strips, address that these can fall off, shower as tolerated) yes 7. Have your bowels moved since your surgery?  If not, are you passing gas?  yes 8. Are you up and walking 3-4 times per day?  yes    1. Do you have an appointment made to see your surgeon in the next month?  yes 2. Were you provided your discharge medications before your surgery or before you were discharged from the hospital and are you taking them without problem?  yes 3. Were you provided phone numbers to the clinic/surgeon's office?  yes 4. Did you watch the patient education video module in the (clinic, surgeon's office, etc.) before your surgery? yes 5. Do you have a discharge checklist that was provided to you in the hospital to reference with instructions on how to take care of yourself after surgery?  yes 6. Did you see a dietitian or nutritional counselor while you were in the hospital?  yes 7. Do you have an appointment to see a dietitian or nutritional counselor in the next month?  yes

## 2014-06-24 ENCOUNTER — Encounter: Payer: BC Managed Care – PPO | Attending: General Surgery

## 2014-06-24 VITALS — Ht 61.0 in | Wt 265.0 lb

## 2014-06-24 DIAGNOSIS — Z713 Dietary counseling and surveillance: Secondary | ICD-10-CM | POA: Diagnosis not present

## 2014-06-24 DIAGNOSIS — Z6841 Body Mass Index (BMI) 40.0 and over, adult: Secondary | ICD-10-CM

## 2014-06-24 NOTE — Progress Notes (Signed)
Bariatric Class:  Appt start time: 1530 end time:  1630.  2 Week Post-Operative Nutrition Class  Patient was seen on 06/24/2014 for Post-Operative Nutrition education at [facility].   Surgery date: 06/02/14 Surgery type: Gastric sleeve Start weight at [facility]: 275.5 lbs on 04/12/14 Weight today: 265.0lbs  Weight change: 17.5 lbs  TANITA  BODY COMP RESULTS  05/19/14 06/24/14   BMI (kg/m^2) 53.4 50.1   Fat Mass (lbs) 136 118.5   Fat Free Mass (lbs) 146.5 146.5   Total Body Water (lbs) 107 107.0    The following the learning objectives were met by the patient during this course:  Identifies Phase 3A (Soft, High Proteins) Dietary Goals and will begin from 2 weeks post-operatively to 2 months post-operatively  Identifies appropriate sources of fluids and proteins   States protein recommendations and appropriate sources post-operatively  Identifies the need for appropriate texture modifications, mastication, and bite sizes when consuming solids  Identifies appropriate multivitamin and calcium sources post-operatively  Describes the need for physical activity post-operatively and will follow MD recommendations  States when to call healthcare provider regarding medication questions or post-operative complications  Handouts given during class include:  Phase 3A: Soft, High Protein Diet Handout  Follow-Up Plan: Patient will follow-up at [facility] in 6 weeks for 2 month post-op nutrition visit for diet advancement per MD.

## 2014-07-11 ENCOUNTER — Ambulatory Visit: Payer: BC Managed Care – PPO | Admitting: Pulmonary Disease

## 2014-07-28 ENCOUNTER — Ambulatory Visit (INDEPENDENT_AMBULATORY_CARE_PROVIDER_SITE_OTHER): Payer: BC Managed Care – PPO | Admitting: Pulmonary Disease

## 2014-07-28 ENCOUNTER — Encounter: Payer: Self-pay | Admitting: Pulmonary Disease

## 2014-07-28 VITALS — BP 122/68 | HR 81 | Temp 98.3°F | Ht 61.0 in | Wt 246.8 lb

## 2014-07-28 DIAGNOSIS — G4733 Obstructive sleep apnea (adult) (pediatric): Secondary | ICD-10-CM

## 2014-07-28 NOTE — Assessment & Plan Note (Signed)
The patient continues on C Pap, however is not wearing as compliantly as she should. She states this is not a pressure or mask issue, and is more of just forgetting to put on before she falls asleep. She has had bariatric surgery, and lost significant weight since her last visit. If this continues, I'm hoping she is not going to need her C Pap in the future. I've asked her to try and wear more compliantly, especially since she sees a difference whenever she wears her device.

## 2014-07-28 NOTE — Patient Instructions (Signed)
Continue on cpap, and try to wear on a more consistent basis.   Keep up with mask changes and supplies. Keep working on weight loss, you are doing great.  followup with Dr. Halford Chessman in one year, but call us if you lose significant weight before then so we can re-assess your ongoing need for cpap.

## 2014-07-28 NOTE — Progress Notes (Signed)
   Subjective:    Patient ID: Tiffany Roth, female    DOB: 08/10/1990, 24 y.o.   MRN: [id number]  HPI Patient comes in today for follow-up of her obstructive sleep apnea. She has had area trach surgery, and has lost 13 pounds since her last visit. She continues on C Pap, but is not wearing it as compliantly as she should, or as long as she should. Her main reason for this is related to "laziness" and putting on her device before she falls asleep. She clearly sees a difference on the night she wears and doesn't wear. Her download shows adequate control of her AHI on her current pressure setting. The patient denies any issues with her mask leak or mask comfort.   Review of Systems  Constitutional: Negative for fever and unexpected weight change.  HENT: Negative for congestion, dental problem, ear pain, nosebleeds, postnasal drip, rhinorrhea, sinus pressure, sneezing, sore throat and trouble swallowing.   Eyes: Negative for redness and itching.  Respiratory: Negative for cough, chest tightness, shortness of breath and wheezing.   Cardiovascular: Negative for palpitations and leg swelling.  Gastrointestinal: Negative for nausea and vomiting.  Genitourinary: Negative for dysuria.  Musculoskeletal: Negative for joint swelling.  Skin: Negative for rash.  Neurological: Negative for headaches.  Hematological: Does not bruise/bleed easily.  Psychiatric/Behavioral: Negative for dysphoric mood. The patient is not nervous/anxious.        Objective:   Physical Exam Obese female in no acute distress Nose without purulence or discharge noted No skin breakdown or pressure necrosis from the C Pap mask Neck without lymphadenopathy or thyromegaly Lower extremities with mild edema, no cyanosis Alert and oriented, does not appear to be sleepy, moves all 4 extremities.       Assessment & Plan:

## 2014-08-05 ENCOUNTER — Encounter: Payer: Self-pay | Admitting: Dietician

## 2014-08-05 ENCOUNTER — Encounter: Payer: BC Managed Care – PPO | Attending: General Surgery | Admitting: Dietician

## 2014-08-05 VITALS — Ht 61.0 in | Wt 243.5 lb

## 2014-08-05 DIAGNOSIS — Z713 Dietary counseling and surveillance: Secondary | ICD-10-CM | POA: Diagnosis not present

## 2014-08-05 DIAGNOSIS — Z6841 Body Mass Index (BMI) 40.0 and over, adult: Secondary | ICD-10-CM | POA: Insufficient documentation

## 2014-08-05 NOTE — Patient Instructions (Addendum)
Goals:  Follow Phase 3B: High Protein + Non-Starchy Vegetables  Eat 3-6 small meals/snacks, every 3-5 hrs  Increase lean protein foods to meet 60g goal  Increase fluid intake to 64oz +  Avoid drinking 15 minutes before, during and 30 minutes after eating  Aim for >30 min of physical activity daily  Start limiting nuts if you see your weight loss slow   TANITA  BODY COMP RESULTS  05/19/14 06/24/14 08/05/14   BMI (kg/m^2) 53.4 50.1 46   Fat Mass (lbs) 136 118.5 106.5   Fat Free Mass (lbs) 146.5 146.5 137   Total Body Water (lbs) 107 107.0 100.5

## 2014-08-05 NOTE — Progress Notes (Signed)
  Follow-up visit:  8 Weeks Post-Operative Sleeve Gastrectomy Surgery  Medical Nutrition Therapy:  Appt start time: 340 end time:  410  Primary concerns today: Post-operative Bariatric Surgery Nutrition Management.  Patient reports that she no longer likes pork or [place] bacon. Cannot tolerate tomatoes. Tolerating all other recommended foods. Patient reports occasional dark green, loose stools. She saw Glendale Chard, PA recently and was cleared for exercise. She weighs herself every Saturday. Following up with Dr. Ardath Sax every 2 weeks to discuss binge eating and emotional eating.   Non scale goals:  -Wear a bikini, skirts, and other cute clothes -Improved self confidence -Improved varicose veins  Surgery date: 06/02/14 Surgery type: Gastric sleeve Start weight at [facility]: 275.5 lbs on 04/12/14 Weight today: 243.5 lbs Weight change: 21.5 lbs Total weight loss: 32 lbs  TANITA  BODY COMP RESULTS  05/19/14 06/24/14 08/05/14   BMI (kg/m^2) 53.4 50.1 46   Fat Mass (lbs) 136 118.5 106.5   Fat Free Mass (lbs) 146.5 146.5 137   Total Body Water (lbs) 107 107.0 100.5    Preferred Learning Style:   No preference indicated   Learning Readiness:   Ready  24-hr recall: B (AM): Premier protein shake (30g) Snk (AM):   L (PM): 2 oz ham and 1/2 slice of cheese with mustard (17g) Snk (PM): sugar free jello and 5-10sriracha almonds  D (PM): 1.5-2 oz chicken or hamburger, sometimes with well-cooked broccoli (28g) Snk (PM): none  Fluid intake: 80 oz per patient Estimated total protein intake: 75g per day  Medications: see list Supplementation: taking  CBG monitoring: patient does not test Last patient reported A1c: none available  Using straws: no Drinking while eating: yes sometimes, gets thirsty during meals (patient states it hurts when she drinks while eating) Hair loss: none Carbonated beverages: no N/V/D/C: vomiting "when I overeat" about 1x a week; intermittent diarrhea and  constipation Dumping syndrome: feels sweaty when she overeats  Recent physical activity: running and lifting weights for 1.5 hours 4-5 days a week   Progress Towards Goal(s):  In progress.  Handouts given during visit include:  Phase 3B lean protein + non starchy vegetables   Nutritional Diagnosis:  [place]-3.3 Overweight/obesity related to past poor dietary habits and physical inactivity as evidenced by patient w/ recent gastric sleeve surgery following dietary guidelines for continued weight loss.     Intervention:  Nutrition counseling provided.  Teaching Method Utilized:  Visual Auditory Hands on  Barriers to learning/adherence to lifestyle change: none  Demonstrated degree of understanding via:  Teach Back   Monitoring/Evaluation:  Dietary intake, exercise, and body weight. Follow up in 1 months for 3 month post-op visit.

## 2014-08-21 ENCOUNTER — Other Ambulatory Visit: Payer: Self-pay | Admitting: *Deleted

## 2014-09-17 ENCOUNTER — Ambulatory Visit: Payer: BC Managed Care – PPO | Admitting: Dietician

## 2015-04-13 ENCOUNTER — Ambulatory Visit: Payer: BC Managed Care – PPO | Admitting: Internal Medicine

## 2015-04-13 ENCOUNTER — Telehealth: Payer: Self-pay | Admitting: Internal Medicine

## 2015-04-13 NOTE — Telephone Encounter (Signed)
FYI

## 2015-04-13 NOTE — Telephone Encounter (Signed)
[facility] - Client [place] [facility]  Patient Name: Tiffany Roth  DOB: June 05, 1990    Initial Comment Caller states she was suppose to come in today for a sinus infection. Her nasals are really stuffed up and she's not sure what to take. Her head is really hurting.    Nurse Assessment  Nurse: Wynetta Emery, RN, Baker Janus Date/Time Eilene Ghazi Time): 04/13/2015 4:09:31 PM  Confirm and document reason for call. If symptomatic, describe symptoms. You must click the next button to save text entered. ---Tiffany Roth has sinus pain and pressure along with headache could not keep appt for today d/t coming from [place]  Has the patient traveled out of the country within the last 30 days? ---No  Does the patient have any new or worsening symptoms? ---Yes  Will a triage be completed? ---Yes  Related visit to physician within the last 2 weeks? ---No  Does the PT have any chronic conditions? (i.e. diabetes, asthma, etc.) ---Unknown  Did the patient indicate they were pregnant? ---No  Is this a behavioral health or substance abuse call? ---No     Guidelines    Guideline Title Affirmed Question Affirmed Notes  Sinus Pain or Congestion [1] Sinus congestion (pressure, fullness) AND [2] present > 10 days    Final Disposition User   See PCP When Office is Open (within 3 days) Wynetta Emery, RN, Baker Janus    Disagree/Comply: Comply

## 2015-04-22 ENCOUNTER — Encounter: Payer: Self-pay | Admitting: Internal Medicine

## 2015-04-22 ENCOUNTER — Ambulatory Visit (INDEPENDENT_AMBULATORY_CARE_PROVIDER_SITE_OTHER): Payer: BC Managed Care – PPO | Admitting: Internal Medicine

## 2015-04-22 ENCOUNTER — Encounter: Payer: Self-pay | Admitting: Family Medicine

## 2015-04-22 VITALS — BP 118/80 | Temp 98.6°F | Wt 218.4 lb

## 2015-04-22 DIAGNOSIS — Z9884 Bariatric surgery status: Secondary | ICD-10-CM | POA: Diagnosis not present

## 2015-04-22 DIAGNOSIS — J069 Acute upper respiratory infection, unspecified: Secondary | ICD-10-CM | POA: Diagnosis not present

## 2015-04-22 DIAGNOSIS — J0100 Acute maxillary sinusitis, unspecified: Secondary | ICD-10-CM

## 2015-04-22 DIAGNOSIS — E232 Diabetes insipidus: Secondary | ICD-10-CM

## 2015-04-22 MED ORDER — AMOXICILLIN-POT CLAVULANATE 875-125 MG PO TABS: 1.0000 | ORAL_TABLET | Freq: Two times a day (BID) | ORAL | Status: DC

## 2015-04-22 NOTE — Progress Notes (Signed)
Pre visit review using our clinic review tool, if applicable. No additional management support is needed unless otherwise documented below in the visit note.   Chief Complaint  Patient presents with  . Sinus Problem    Sore throat, drainage, pressure/pain, headache, ear popping/itching.  X2-3 wks    HPI: Tiffany Roth 25 y.o.  Sinus issue   For 3 weeks   Onset like  A cold and now cont drainages more than usual  .  No fever.  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 Prematurity     "3 months" 1# 15 oz" ventilator   . 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 Sinus infection     recently diagnosed currently on antibiotic will have completed dosage prior to surgery   . Sleep apnea     uses CPAP   . Eczema     Family History  Problem Relation Age of Onset  . Thyroid disease Mother   . Hypertension Mother     Social History   Social History  . Marital Status: Single    Spouse Name: N/A  . Number of Children: N/A  . 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 Smoking status: Never Smoker   . Smokeless tobacco: Never Used  . Alcohol Use: No  . Drug Use: No  . Sexual Activity: Not Asked   Other Topics Concern  . 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    Doing much better less stress   Going to grad school in august .  [place] in [employer] education .     Outpatient Prescriptions Prior to Visit  Medication Sig Dispense Refill  . desmopressin (DDAVP) 0.01 % SOLN Place 1 spray into the nose 2 (two) times daily. 1 Bottle 1  . Emollient (AQUAPHOR EX) Apply 1 application topically daily.    . ferrous sulfate 325 (65 FE) MG tablet Take 325 mg by mouth daily with breakfast.    . Multiple Vitamin (MULTIVITAMIN WITH MINERALS) TABS tablet Take 1 tablet by mouth daily.    . ondansetron (ZOFRAN ODT) 4 MG disintegrating tablet Take 1 tablet (4 mg total) by mouth every 8 (eight) hours as needed for nausea or vomiting. 20 tablet 0  . oxyCODONE (ROXICODONE) 5 MG/5ML solution Take 5-10 mLs (5-10 mg total) by mouth every 4 (four) hours as needed for moderate pain or severe pain.  0  . triamcinolone ointment (KENALOG) 0.1 % Apply 1 application topically 2 (two) times daily. 453.6 g 1  .  alum & mag hydroxide-simeth (MAALOX/MYLANTA) 200-200-20 MG/5ML suspension Take 15 mLs by mouth every 6 (six) hours as needed for indigestion or heartburn. 355 mL 0  . cetirizine (ZYRTEC) 10 MG tablet Take 1 tablet (10 mg total) by mouth daily. 30 tablet 11   No facility-administered medications prior to visit.     EXAM:  BP 118/80 mmHg  Temp(Src) 98.6 F (37 C) (Oral)  Wt 218 lb 6.4 oz (99.066 kg)  Body mass index is 41.29 kg/(m^2). WDWN in NAD  quiet respirations; mildly congested  somewhat hoarse. Non toxic .looks allergic anccongested  HEENT: Normocephalic ;atraumatic , Eyes;  PERRL, EOMs  Full, lids and conjunctiva clear,,Ears: no deformities, canals nl, TM landmarks normal,no sig wax Nose: no deformity or discharge but congested;crusted right more than left face r maxilla tender tender Mouth : OP clear without lesion or edema . Neck: Supple without adenopathy or masses or bruits Chest:  Clear to  A&P without wheezes rales or rhonchi CV:  S1-S2 no gallops or murmurs peripheral perfusion is normal Skin :nl perfusion and no acute rashes  PSYCH: pleasant and cooperative, no obvious depression or anxiety Wt Readings from Last 3 Encounters:  04/22/15 218 lb 6.4 oz (99.066 kg)  08/05/14 243 lb 8 oz (110.451 kg)  07/28/14 246 lb 12.8 oz (111.948 kg)    ASSESSMENT AND PLAN:  Discussed the following assessment and plan:  Acute maxillary sinusitis, recurrence not specified - right maxilla    Protracted URI  Bariatric surgery status Right maxillary  Sinus pain protracted uri with underlying allergy  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  Patient Instructions  Take antibiotic  Add  flonase  Every day to dec swelling in nasal passages.  Make sure   Dr Moshe Cipro   Send [place] a note when she sees you.  Also make sure you get a yearly tsh and free t 4  To make sure thyroid  Function  Remains normal .   Sinusitis, Adult Sinusitis is redness, soreness, and inflammation of the paranasal sinuses. Paranasal sinuses are air pockets within the bones of your face. They are located beneath your eyes, in the middle of your forehead, and above your eyes. In healthy paranasal sinuses, mucus is able to drain out, and air is able to circulate through them by way of your nose. However, when your paranasal sinuses are inflamed, mucus and air can become trapped. This can allow bacteria and other germs to grow and cause infection. Sinusitis can develop quickly and last only a short time (acute) or continue over a long period (chronic). Sinusitis that lasts for more than 12 weeks is considered chronic. CAUSES Causes of sinusitis include:  Allergies.  Structural abnormalities, such as displacement of the cartilage that separates your nostrils (deviated septum), which can decrease the air  flow through your nose and sinuses and affect sinus drainage.  Functional abnormalities, such as when the small hairs (cilia) that line your sinuses and help remove mucus do not work properly or are not present. SIGNS AND SYMPTOMS Symptoms of acute and chronic sinusitis are the same. The primary symptoms are pain and pressure around the affected sinuses. Other symptoms include:  Upper toothache.  Earache.  Headache.  Bad breath.  Decreased sense of smell and taste.  A cough, which worsens when you are lying flat.  Fatigue.  Fever.  Thick drainage  from your nose, which often is green and may contain pus (purulent).  Swelling and warmth over the affected sinuses. DIAGNOSIS Your health care provider will perform a physical exam. During your exam, your health care provider may perform any of the following to help determine if you have acute sinusitis or chronic sinusitis:  Look in your nose for signs of abnormal growths in your nostrils (nasal polyps).  Tap over the affected sinus to check for signs of infection.  View the inside of your sinuses using an imaging device that has a light attached (endoscope). If your health care provider suspects that you have chronic sinusitis, one or more of the following tests may be recommended:  Allergy tests.  Nasal culture. A sample of mucus is taken from your nose, sent to a lab, and screened for bacteria.  Nasal cytology. A sample of mucus is taken from your nose and examined by your health care provider to determine if your sinusitis is related to an allergy. TREATMENT Most cases of acute sinusitis are related to a viral infection and will resolve on their own within 10 days. Sometimes, medicines are prescribed to help relieve symptoms of both acute and chronic sinusitis. These may include pain medicines, decongestants, nasal steroid sprays, or saline sprays. However, for sinusitis related to a bacterial infection, your health care  provider will prescribe antibiotic medicines. These are medicines that will help kill the bacteria causing the infection. Rarely, sinusitis is caused by a fungal infection. In these cases, your health care provider will prescribe antifungal medicine. For some cases of chronic sinusitis, surgery is needed. Generally, these are cases in which sinusitis recurs more than 3 times per year, despite other treatments. HOME CARE INSTRUCTIONS  Drink plenty of water. Water helps thin the mucus so your sinuses can drain more easily.  Use a humidifier.  Inhale steam 3-4 times a day (for example, sit in the bathroom with the shower running).  Apply a warm, moist washcloth to your face 3-4 times a day, or as directed by your health care provider.  Use saline nasal sprays to help moisten and clean your sinuses.  Take medicines only as directed by your health care provider.  If you were prescribed either an antibiotic or antifungal medicine, finish it all even if you start to feel better. SEEK IMMEDIATE MEDICAL CARE IF:  You have increasing pain or severe headaches.  You have nausea, vomiting, or drowsiness.  You have swelling around your face.  You have vision problems.  You have a stiff neck.  You have difficulty breathing.   This information is not intended to replace advice given to you by your health care provider. Make sure you discuss any questions you have with your health care provider.   Document Released: 02/21/2005 Document Revised: 03/14/2014 Document Reviewed: 03/08/2011 Elsevier Interactive Patient Education 2016 [place] K. Kyrstyn Greear M.D.

## 2015-04-22 NOTE — Patient Instructions (Addendum)
Take antibiotic  Add  flonase  Every day to dec swelling in nasal passages.  Make sure   Dr Moshe Cipro   Send [place] a note when she sees you.  Also make sure you get a yearly tsh and free t 4  To make sure thyroid  Function  Remains normal .   Sinusitis, Adult Sinusitis is redness, soreness, and inflammation of the paranasal sinuses. Paranasal sinuses are air pockets within the bones of your face. They are located beneath your eyes, in the middle of your forehead, and above your eyes. In healthy paranasal sinuses, mucus is able to drain out, and air is able to circulate through them by way of your nose. However, when your paranasal sinuses are inflamed, mucus and air can become trapped. This can allow bacteria and other germs to grow and cause infection. Sinusitis can develop quickly and last only a short time (acute) or continue over a long period (chronic). Sinusitis that lasts for more than 12 weeks is considered chronic. CAUSES Causes of sinusitis include:  Allergies.  Structural abnormalities, such as displacement of the cartilage that separates your nostrils (deviated septum), which can decrease the air flow through your nose and sinuses and affect sinus drainage.  Functional abnormalities, such as when the small hairs (cilia) that line your sinuses and help remove mucus do not work properly or are not present. SIGNS AND SYMPTOMS Symptoms of acute and chronic sinusitis are the same. The primary symptoms are pain and pressure around the affected sinuses. Other symptoms include:  Upper toothache.  Earache.  Headache.  Bad breath.  Decreased sense of smell and taste.  A cough, which worsens when you are lying flat.  Fatigue.  Fever.  Thick drainage from your nose, which often is green and may contain pus (purulent).  Swelling and warmth over the affected sinuses. DIAGNOSIS Your health care provider will perform a physical exam. During your exam, your health care provider may  perform any of the following to help determine if you have acute sinusitis or chronic sinusitis:  Look in your nose for signs of abnormal growths in your nostrils (nasal polyps).  Tap over the affected sinus to check for signs of infection.  View the inside of your sinuses using an imaging device that has a light attached (endoscope). If your health care provider suspects that you have chronic sinusitis, one or more of the following tests may be recommended:  Allergy tests.  Nasal culture. A sample of mucus is taken from your nose, sent to a lab, and screened for bacteria.  Nasal cytology. A sample of mucus is taken from your nose and examined by your health care provider to determine if your sinusitis is related to an allergy. TREATMENT Most cases of acute sinusitis are related to a viral infection and will resolve on their own within 10 days. Sometimes, medicines are prescribed to help relieve symptoms of both acute and chronic sinusitis. These may include pain medicines, decongestants, nasal steroid sprays, or saline sprays. However, for sinusitis related to a bacterial infection, your health care provider will prescribe antibiotic medicines. These are medicines that will help kill the bacteria causing the infection. Rarely, sinusitis is caused by a fungal infection. In these cases, your health care provider will prescribe antifungal medicine. For some cases of chronic sinusitis, surgery is needed. Generally, these are cases in which sinusitis recurs more than 3 times per year, despite other treatments. HOME CARE INSTRUCTIONS  Drink plenty of water. Water helps thin  the mucus so your sinuses can drain more easily.  Use a humidifier.  Inhale steam 3-4 times a day (for example, sit in the bathroom with the shower running).  Apply a warm, moist washcloth to your face 3-4 times a day, or as directed by your health care provider.  Use saline nasal sprays to help moisten and clean your  sinuses.  Take medicines only as directed by your health care provider.  If you were prescribed either an antibiotic or antifungal medicine, finish it all even if you start to feel better. SEEK IMMEDIATE MEDICAL CARE IF:  You have increasing pain or severe headaches.  You have nausea, vomiting, or drowsiness.  You have swelling around your face.  You have vision problems.  You have a stiff neck.  You have difficulty breathing.   This information is not intended to replace advice given to you by your health care provider. Make sure you discuss any questions you have with your health care provider.   Document Released: 02/21/2005 Document Revised: 03/14/2014 Document Reviewed: 03/08/2011 Elsevier Interactive Patient Education [employer].

## 2015-08-12 ENCOUNTER — Ambulatory Visit: Payer: BC Managed Care – PPO | Admitting: Pulmonary Disease

## 2015-09-11 ENCOUNTER — Encounter: Payer: Self-pay | Admitting: Internal Medicine

## 2015-09-11 ENCOUNTER — Ambulatory Visit (INDEPENDENT_AMBULATORY_CARE_PROVIDER_SITE_OTHER): Payer: BC Managed Care – PPO | Admitting: Internal Medicine

## 2015-09-11 VITALS — BP 116/70 | Temp 97.8°F | Wt 217.6 lb

## 2015-09-11 DIAGNOSIS — L301 Dyshidrosis [pompholyx]: Secondary | ICD-10-CM | POA: Diagnosis not present

## 2015-09-11 DIAGNOSIS — Z9884 Bariatric surgery status: Secondary | ICD-10-CM | POA: Diagnosis not present

## 2015-09-11 DIAGNOSIS — E232 Diabetes insipidus: Secondary | ICD-10-CM

## 2015-09-11 MED ORDER — FLUOCINONIDE-E 0.05 % EX CREA: 1.0000 "application " | TOPICAL_CREAM | Freq: Two times a day (BID) | CUTANEOUS | Status: DC

## 2015-09-11 NOTE — Progress Notes (Signed)
( Pre visit review using our clinic review tool, if applicable. No additional management support is needed unless otherwise documented below in the visit note.  Chief Complaint  Patient presents with  . Eczema    HPI: [name] 25 y.o.   sda appt  She has a history of significant eczema that had been in previously since she went off red meat and dairy. However she began eating a back she felt lightheaded on the previous diet. Since then she states that Hand and face has falred   Up.  For the last few weeks. Her mouth lips is dry could be anxiety hands does wash her hands a bit more since she got a pet dog. She is left-handed. No fever. She does see the renal doctor Dr. Moshe Cipro who is done laboratory studies in the recent past.  ROS: See pertinent positives and negatives per HPI. She reports having done well with her bariatric surgery. Has an appointment with her surgeon this evening afternoon.  Past Medical History  Diagnosis Date  . 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 Prematurity     "3 months" 1# 15 oz" ventilator   . 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 Sinus infection     recently diagnosed currently on antibiotic will have completed dosage prior to surgery   . Sleep apnea     uses CPAP   . Eczema     Family History  Problem Relation Age of Onset  . Thyroid disease Mother   . Hypertension Mother     Social History   Social History  . Marital Status: Single    Spouse Name: N/A  . Number of Children: N/A  . 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 Smoking status: Never Smoker   . Smokeless tobacco: Never Used  . Alcohol Use: No  . Drug Use: No  . Sexual Activity:  Not Asked   Other Topics Concern  . 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    Doing much better less stress   Going to grad school in august .  [place] in [employer] education .     Outpatient Prescriptions Prior to Visit  Medication Sig Dispense Refill  . desmopressin (DDAVP) 0.01 % SOLN Place 1 spray into the nose 2 (two) times daily. 1 Bottle 1  . Emollient (AQUAPHOR EX) Apply 1 application topically daily.    . ferrous sulfate 325 (65 FE) MG tablet Take 325 mg by mouth daily with breakfast.    . Multiple Vitamin (MULTIVITAMIN WITH MINERALS) TABS tablet Take 1 tablet by mouth daily.    Marland Kitchen oxyCODONE (ROXICODONE) 5 MG/5ML solution Take 5-10 mLs (5-10 mg total) by mouth every 4 (four) hours as needed for moderate pain or severe pain.  0  . amoxicillin-clavulanate (AUGMENTIN) 875-125 MG tablet Take 1 tablet by mouth every 12 (twelve) hours. 14 tablet 0  . ondansetron (ZOFRAN ODT)  4 MG disintegrating tablet Take 1 tablet (4 mg total) by mouth every 8 (eight) hours as needed for nausea or vomiting. 20 tablet 0  . triamcinolone ointment (KENALOG) 0.1 % Apply 1 application topically 2 (two) times daily. 453.6 g 1   No facility-administered medications prior to visit.     EXAM:  BP 116/70 mmHg  Temp(Src) 97.8 F (36.6 C) (Oral)  Wt 217 lb 9.6 oz (98.703 kg)  Body mass index is 41.14 kg/(m^2).  GENERAL: vitals reviewed and listed above, alert, oriented, appears well hydrated and in no acute distress HEENT: atraumatic, conjunctiva  clear, no obvious abnormalities on inspection of external nose and ears Has some dryness of her lips but no acute rash otherwise her face. NECK: no obvious masses on inspection palpation   Hands show +2 hand dermatitis with some cracking and fissuring no obvious infection noted no vesicles. Rest of  her skin is healed some leftover hyperpigmentation from previous episodes. PSYCH: pleasant and cooperative, no obvious depression or anxiety BP Readings from Last 3 Encounters:  09/11/15 116/70  04/22/15 118/80  07/28/14 122/68   Wt Readings from Last 3 Encounters:  09/11/15 217 lb 9.6 oz (98.703 kg)  04/22/15 218 lb 6.4 oz (99.066 kg)  08/05/14 243 lb 8 oz (110.451 kg)   Lab Results  Component Value Date   WBC 11.6* 06/05/2014   HGB 14.1 06/05/2014   HCT 44.6 06/05/2014   PLT 272 06/05/2014   GLUCOSE 112* 06/06/2014   CHOL 149 03/13/2014   TRIG 138 03/13/2014   HDL 35* 03/13/2014   LDLCALC 86 03/13/2014   ALT 73* 06/05/2014   AST 51* 06/05/2014   NA 136 06/06/2014   K 3.8 06/06/2014   CL 103 06/06/2014   CREATININE 1.12* 06/06/2014   BUN 15 06/06/2014   CO2 25 06/06/2014   TSH 2.816 03/13/2014    ASSESSMENT AND PLAN:  Discussed the following assessment and plan:  Dyshidrotic hand dermatitis  Bariatric surgery status  Diabetes insipidus ([place]) Discussed options for treatment would use topical cream because she is traveling L[place]x twice a day. Discussed risk benefit of systemic steroids not usually advised and localized eczema because of metabolic problems and rebound effect. Use moisturizer on lips. She looks like she is due for full labs in [place] last surgery will see her specialist soon. 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 -Patient advised to return or notify health care team  if symptoms worsen ,persist or new concerns arise.  Patient Instructions  Topical  Steroid  For hand therapy    If not working in 2 weeks  Then contact us .   Would reserve  oral prednisone   Because of  Potential metabolic side effects.   Plan  cpx with labs  In the next 4 months   We can doa work in if needed with your schedule.        Standley Brooking. Panosh M.D.

## 2015-09-11 NOTE — Patient Instructions (Signed)
Topical  Steroid  For hand therapy    If not working in 2 weeks  Then contact us .   Would reserve  oral prednisone   Because of  Potential metabolic side effects.   Plan  cpx with labs  In the next 4 months   We can doa work in if needed with your schedule.

## 2015-10-06 ENCOUNTER — Encounter: Payer: BC Managed Care – PPO | Attending: General Surgery | Admitting: Dietician

## 2015-10-06 DIAGNOSIS — Z713 Dietary counseling and surveillance: Secondary | ICD-10-CM | POA: Diagnosis not present

## 2015-10-06 DIAGNOSIS — Z6841 Body Mass Index (BMI) 40.0 and over, adult: Secondary | ICD-10-CM

## 2015-10-06 DIAGNOSIS — Z01818 Encounter for other preprocedural examination: Secondary | ICD-10-CM | POA: Insufficient documentation

## 2015-10-06 NOTE — Progress Notes (Signed)
  Follow-up visit:  16 months Post-Operative Sleeve Gastrectomy Surgery  Medical Nutrition Therapy:  Appt start time: 910 end time:  950  Primary concerns today: Post-operative Bariatric Surgery Nutrition Management.  Tiffany Roth returns for her first nutrition follow up in over a year. Considers herself a "slow loser." Followed a vegan diet for 3 months and liked it but noticed low energy. Does not weigh herself at home but notes that she has gained weight, about 10 pounds. Closes on her new condo next week.   Non scale goals:  -Wear a bikini, skirts, and other cute clothes -Improved self confidence -Improved varicose veins  Surgery date: 06/02/14 Surgery type: Gastric sleeve [facility] weight at [facility]: 275.5 lbs on 04/12/14 Weight today: 225.4 lbs Weight change: 18.1 lbs Total weight loss: 50 lbs Goal weight: 150-160 lbs  TANITA  BODY COMP RESULTS  05/19/14 06/24/14 08/05/14 10/06/15   BMI (kg/m^2) 53.4 50.1 46 41.2   Fat Mass (lbs) 136 118.5 106.5 99.6   Fat Free Mass (lbs) 146.5 146.5 137 125.8   Total Body Water (lbs) 107 107.0 100.5 92    Preferred Learning Style:   No preference indicated   Learning Readiness:   Ready  24-hr recall: B (AM): Herbalife shake with eggs and bacon Snk (AM): Sliced [place] or cheese or french fries L (PM): homemade fruit smoothie  Snk (PM): Herbalife shake or salad or fast food D (PM): meat and broccoli Snk (PM): watermelon  Fluid intake: 128 oz water per patient, soda, juice  Estimated total protein intake: 75g per day  Medications: see list Supplementation: taking Women's One a Day  CBG monitoring: patient does not test Last patient reported A1c: none available  Using straws: no Drinking while eating: yes Hair loss: none Carbonated beverages: yes N/V/D/C: diarrhea with Herbalife shakes Dumping syndrome: feels sweaty when she overeats  Recent physical activity: 2 hours 2x a day (running, stair master, strength training)   Progress  Towards Goal(s):  In progress.  Handouts given during visit include:  Bariatric support group flier  Vegetarian proteins   Nutritional Diagnosis:  [place]-3.3 Overweight/obesity related to past poor dietary habits and physical inactivity as evidenced by patient w/ recent gastric sleeve surgery following dietary guidelines for continued weight loss.     Intervention:  Nutrition counseling provided. -Call Dr. Ardath Sax and make an appointment today!  -Talk to Dr. Ardath Sax about emotional overeating -Start taking Calcium (don't take with iron) -Keep a food log a couple times a week -Start attending support group -When you cook, make enough for leftovers, preportion, and freeze -Experiment with different bariatric-friendly recipes (bariatriceating.com and unjury.com) -Work on Clinical cytogeneticist pattern: Breakfast: shake OR eggs and bacon Lunch: [place] and cheese rollup OR leftover meat and vegetables Dinner: protein and veggies  Snacks: [place], cheese, yogurt, jerky, P3 packs, [place] Balanced Breaks  Teaching Method Utilized:  Visual Auditory Hands on  Barriers to learning/adherence to lifestyle change: none  Demonstrated degree of understanding via:  Teach Back   Monitoring/Evaluation:  Dietary intake, exercise, and body weight. Follow up in 3 months.

## 2015-10-06 NOTE — Patient Instructions (Addendum)
-  Call Dr. Ardath Sax and make an appointment today!  -Talk to Dr. Ardath Sax about emotional overeating  -Start taking Calcium (don't take with iron)  -Keep a food log a couple times a week  -Start attending support group  -When you cook, make enough for leftovers, preportion, and freeze -Experiment with different bariatric-friendly recipes (bariatriceating.com and [email or web address].com)  -Work on Clinical cytogeneticist pattern:  Breakfast: shake OR eggs and bacon Lunch: [place] and cheese rollup OR leftover meat and vegetables Dinner: protein and veggies  Snacks: [place], cheese, yogurt, jerky, P3 packs, [place] Balanced Breaks

## 2015-10-07 ENCOUNTER — Encounter: Payer: Self-pay | Admitting: Dietician

## 2015-12-31 ENCOUNTER — Telehealth: Payer: Self-pay

## 2015-12-31 NOTE — Telephone Encounter (Signed)
Called and spoke to pt. Pt requesting supplies for CPAP. Advised her that she has not been seen since 07/2014 and was scheduled to see AD in 08/2015 but no-showed. Appt made with RA on 03/03/16. Pt verbalized understanding and denied any further questions or concerns at this time.

## 2016-01-20 NOTE — Progress Notes (Signed)
Pre visit review using our clinic review tool, if applicable. No additional management support is needed unless otherwise documented below in the visit note.  Chief Complaint  Patient presents with  . Headache    Headache X 2 wks.  Denied blurred vision, nausea and vomiting.  Also having itchy skin.  Requesting cream.    HPI: Tiffany Roth 25 y.o.  On menses today  Insidious onset of bitemporal parietal headache over the last 2 weeks and waxes and waning without associated symptoms somewhat relieved by rest initially Aleve but no longer helps her. This is a new kind of headache without associated visual changes numbness or weakness. No nausea vomiting. Feels like someone punched her in head. She sees Dr. Moshe Cipro for her DI apparently had labs a few months ago that were okay. She doesn't know if anyone has checked her thyroid recently. 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 Some itching but no fever or discharge. She is status post bariatric surgery asked about excess fat versus skin. ROS: See pertinent positives and negatives per HPI.  Past Medical History:  Diagnosis Date  . Eczema   . 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 Prematurity    "3 months" 1# 15 oz" ventilator   . Sinus infection    recently diagnosed currently on antibiotic will have completed dosage prior to surgery   . Sleep apnea    uses CPAP   . 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 Thyroid disease Mother   . Hypertension Mother     Social History   Social History  . Marital status: Single    Spouse name: N/A  . Number of children: N/A  . Years of education:  N/A   Occupational History  . 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 Smoking status: Never Smoker  . Smokeless tobacco: Never Used  . Alcohol use No  . Drug use: No  . Sexual activity: Not Asked   Other Topics Concern  . 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    Doing much better less stress   Going to grad school in august .  [place] in [employer] education .     Outpatient Medications Prior to Visit  Medication Sig Dispense Refill  . desmopressin (DDAVP) 0.01 % SOLN Place 1 spray into the nose 2 (two) times daily. 1 Bottle 1  . Emollient (AQUAPHOR EX) Apply 1 application topically daily.    . ferrous sulfate 325 (65 FE) MG tablet Take 325 mg by mouth daily with breakfast.    . fluocinonide-emollient (LIDEX-E) 0.05 % cream Apply 1 application topically 2 (two) times daily. 30 g 2  . Multiple Vitamin (MULTIVITAMIN WITH MINERALS) TABS tablet Take 1 tablet by mouth daily.    Marland Kitchen oxyCODONE (ROXICODONE) 5 MG/5ML solution Take 5-10 mLs (5-10 mg total) by mouth every 4 (  four) hours as needed for moderate pain or severe pain.  0   No facility-administered medications prior to visit.      EXAM:  BP 110/74 (BP Location: Right Arm, Patient Position: Sitting, Cuff Size: Large)   Temp 97.8 F (36.6 C) (Oral)   Wt 225 lb (102.1 kg)   BMI 41.15 kg/m   Body mass index is 41.15 kg/m.  GENERAL: vitals reviewed and listed above, alert, oriented, appears well hydrated and in no acute distress HEENT: atraumatic, conjunctiva  clear, no obvious abnormalities on inspection of external nose and ears eoms nl  tms nl   Mild palpation of her bitemporal areas and parietal area she is very sensitive. There is no masses or artery tenderness. Negative TMJ localization. States when she grits her teeth he takes the  pressure off her pain. OP : no lesion edema or exudate tongue is midline. NECK: no obvious masses on inspection palpation  LUNGS: clear to auscultation bilaterally, no wheezes, rales or rhonchi, good air movement CV: HRRR, no clubbing cyanosis or   nl cap refill  Neur 3-12 seem intact   Non focal neg drift  rhomberg  MS: moves all extremities without noticeable focal  Abnormality Skin: Hand dermatitis without discharge vesicle or weeping. Rest of skin seems smooth non-icteric. No unusual bruising or bleeding. PSYCH: pleasant and cooperative, no obvious depression or anxiety  Wt Readings from Last 3 Encounters:  01/21/16 225 lb (102.1 kg)  10/06/15 225 lb 6.4 oz (102.2 kg)  09/11/15 217 lb 9.6 oz (98.7 kg)   BP Readings from Last 3 Encounters:  01/21/16 110/74  09/11/15 116/70  04/22/15 118/80    ASSESSMENT AND PLAN:  Discussed the following assessment and plan:  Headache, unspecified headache type - Unusual sensitive in the bitemporal parietal area no rash nonfocal exam.? neuritisr/o metabolic  - Plan: CBC with Differential/Platelet, Basic metabolic panel, Sedimentation rate  Diabetes insipidus ([place]) - Plan: CBC with Differential/Platelet, Basic metabolic panel, Sedimentation rate  Bariatric surgery status - Plan: CBC with Differential/Platelet, Basic metabolic panel, Sedimentation rate  Dyshidrotic hand dermatitis - falre with inc hand washing  stay on aquifor add topical steroid oint  Hypothyroidism, unspecified type - Plan: TSH, T4, free  Need for prophylactic vaccination and inoculation against influenza - Plan: Flu Vaccine QUAD 36+ mos PF IM (Fluarix & Fluzone Quad PF) Uncertain cause of headache unusual no alarm findings otherwise lab tests today is metabolic okay may add Flexeril or gabapentin. -Patient advised to return or notify health care team  if symptoms worsen ,persist or new concerns arise.  Patient Instructions  Try topical steroid ointment every night for your  hands should improve over the next 2 weeks continue your Aquaphor. Uncertain cause of your headaches sound like musculoskeletal or a neuritis symptom. If labs are okay we may try a mild muscle relaxant or a medicine called gabapentin that helps with nerve pain. We'll decide after getting results back. Also consider getting a head scan because you've never had this kind of headache.    Standley Brooking. Panosh M.D.

## 2016-01-21 ENCOUNTER — Encounter: Payer: Self-pay | Admitting: Family Medicine

## 2016-01-21 ENCOUNTER — Encounter: Payer: Self-pay | Admitting: Internal Medicine

## 2016-01-21 ENCOUNTER — Ambulatory Visit (INDEPENDENT_AMBULATORY_CARE_PROVIDER_SITE_OTHER): Payer: BC Managed Care – PPO | Admitting: Internal Medicine

## 2016-01-21 VITALS — BP 110/74 | Temp 97.8°F | Wt 225.0 lb

## 2016-01-21 DIAGNOSIS — Z9884 Bariatric surgery status: Secondary | ICD-10-CM

## 2016-01-21 DIAGNOSIS — E039 Hypothyroidism, unspecified: Secondary | ICD-10-CM

## 2016-01-21 DIAGNOSIS — L301 Dyshidrosis [pompholyx]: Secondary | ICD-10-CM

## 2016-01-21 DIAGNOSIS — E232 Diabetes insipidus: Secondary | ICD-10-CM

## 2016-01-21 DIAGNOSIS — Z23 Encounter for immunization: Secondary | ICD-10-CM

## 2016-01-21 DIAGNOSIS — R519 Headache, unspecified: Secondary | ICD-10-CM

## 2016-01-21 DIAGNOSIS — R51 Headache: Secondary | ICD-10-CM | POA: Diagnosis not present

## 2016-01-21 LAB — CBC WITH DIFFERENTIAL/PLATELET
BASOS PCT: 0.7 % (ref 0.0–3.0)
Basophils Absolute: 0 10*3/uL (ref 0.0–0.1)
EOS PCT: 2 % (ref 0.0–5.0)
Eosinophils Absolute: 0.1 10*3/uL (ref 0.0–0.7)
HCT: 40.2 % (ref 36.0–46.0)
Hemoglobin: 13.4 g/dL (ref 12.0–15.0)
LYMPHS ABS: 2.8 10*3/uL (ref 0.7–4.0)
Lymphocytes Relative: 46 % (ref 12.0–46.0)
MCHC: 33.4 g/dL (ref 30.0–36.0)
MCV: 91 fl (ref 78.0–100.0)
MONO ABS: 0.6 10*3/uL (ref 0.1–1.0)
MONOS PCT: 10.1 % (ref 3.0–12.0)
NEUTROS ABS: 2.5 10*3/uL (ref 1.4–7.7)
NEUTROS PCT: 41.2 % — AB (ref 43.0–77.0)
PLATELETS: 276 10*3/uL (ref 150.0–400.0)
RBC: 4.42 Mil/uL (ref 3.87–5.11)
RDW: 12.6 % (ref 11.5–15.5)
WBC: 6.1 10*3/uL (ref 4.0–10.5)

## 2016-01-21 LAB — TSH: TSH: 1.83 u[IU]/mL (ref 0.35–4.50)

## 2016-01-21 LAB — SEDIMENTATION RATE: SED RATE: 10 mm/h (ref 0–20)

## 2016-01-21 LAB — T4, FREE: FREE T4: 0.97 ng/dL (ref 0.60–1.60)

## 2016-01-21 LAB — BASIC METABOLIC PANEL
BUN: 11 mg/dL (ref 6–23)
CALCIUM: 9.2 mg/dL (ref 8.4–10.5)
CO2: 27 mEq/L (ref 19–32)
CREATININE: 1.28 mg/dL — AB (ref 0.40–1.20)
Chloride: 109 mEq/L (ref 96–112)
GFR: 64.17 mL/min (ref >60.00)
Glucose, Bld: 84 mg/dL (ref 70–99)
Potassium: 4.5 mEq/L (ref 3.5–5.1)
SODIUM: 142 meq/L (ref 135–145)

## 2016-01-21 MED ORDER — MOMETASONE FUROATE 0.1 % EX OINT: TOPICAL_OINTMENT | Freq: Every day | CUTANEOUS | 2 refills | Status: DC

## 2016-01-21 NOTE — Patient Instructions (Signed)
Try topical steroid ointment every night for your hands should improve over the next 2 weeks continue your Aquaphor. Uncertain cause of your headaches sound like musculoskeletal or a neuritis symptom. If labs are okay we may try a mild muscle relaxant or a medicine called gabapentin that helps with nerve pain. We'll decide after getting results back. Also consider getting a head scan because you've never had this kind of headache.

## 2016-01-27 ENCOUNTER — Encounter: Payer: Self-pay | Admitting: Family Medicine

## 2016-01-27 ENCOUNTER — Ambulatory Visit (INDEPENDENT_AMBULATORY_CARE_PROVIDER_SITE_OTHER): Payer: BC Managed Care – PPO | Admitting: Family Medicine

## 2016-01-27 ENCOUNTER — Other Ambulatory Visit: Payer: Self-pay | Admitting: Family Medicine

## 2016-01-27 VITALS — BP 118/80 | HR 80 | Resp 12 | Ht 62.0 in | Wt 224.1 lb

## 2016-01-27 DIAGNOSIS — R300 Dysuria: Secondary | ICD-10-CM | POA: Diagnosis not present

## 2016-01-27 DIAGNOSIS — R319 Hematuria, unspecified: Secondary | ICD-10-CM

## 2016-01-27 DIAGNOSIS — R310 Gross hematuria: Secondary | ICD-10-CM

## 2016-01-27 DIAGNOSIS — N390 Urinary tract infection, site not specified: Secondary | ICD-10-CM | POA: Diagnosis not present

## 2016-01-27 LAB — POCT URINALYSIS DIPSTICK
Bilirubin, UA: NEGATIVE
Blood, UA: POSITIVE
Glucose, UA: NEGATIVE
Ketones, UA: NEGATIVE
LEUKOCYTES UA: NEGATIVE
Nitrite, UA: NEGATIVE
PROTEIN UA: POSITIVE
SPEC GRAV UA: 1.025
Urobilinogen, UA: 0.2
pH, UA: 6

## 2016-01-27 MED ORDER — SULFAMETHOXAZOLE-TRIMETHOPRIM 800-160 MG PO TABS: 1.0000 | ORAL_TABLET | Freq: Two times a day (BID) | ORAL | 0 refills | Status: AC

## 2016-01-27 MED ORDER — CYCLOBENZAPRINE HCL 5 MG PO TABS: ORAL_TABLET | ORAL | 0 refills | Status: DC

## 2016-01-27 NOTE — Progress Notes (Addendum)
HPI:   Tiffany Roth is a 25 y.o. female, who is here today with her mother complaining of about a week of urinary symptoms.  She denies associated fever, chills, fatigue, or back pain. No history of nephrolithiasis.  Dysuria: Yes Urinary frequency: Yes Urinary urgency: Denies Incontinence: Denies Hematuria: Yes, gross hematuria. After menstrual bleeding stopped she still noted blood in toilet after urination.  Abdominal pain: Suprapubic "punching" like pain, intermittently, no radiated, mild.   Nausea or vomiting: Denies  Abnormal vaginal bleeding or discharge: Denies  LMP:01/19/16.  Sexual activity: I did not ask because mother was present. Hx of UTI: Denies. Symptoms otherwise stable. OTC medications for this problem: None  Hx of diabetes insipidus, she follows with nephrologists and has appt in 1-2 weeks.  Mother concerned also about renal function.  Lab Results  Component Value Date   CREATININE 1.28 (H) 01/21/2016   BUN 11 01/21/2016   NA 142 01/21/2016   K 4.5 01/21/2016   CL 109 01/21/2016   CO2 27 01/21/2016       Review of Systems  Constitutional: Negative for activity change, appetite change, chills, fatigue and fever.  Gastrointestinal: Positive for abdominal pain. Negative for nausea and vomiting.       No changes in bowel habits.  Genitourinary: Positive for dysuria, frequency and hematuria. Negative for decreased urine volume, difficulty urinating, menstrual problem, urgency, vaginal bleeding and vaginal discharge.  Musculoskeletal: Negative for back pain and myalgias.  Hematological: Negative for adenopathy. Does not bruise/bleed easily.      Current Outpatient Prescriptions on File Prior to Visit  Medication Sig Dispense Refill  . desmopressin (DDAVP) 0.01 % SOLN Place 1 spray into the nose 2 (two) times daily. 1 Bottle 1  . Emollient (AQUAPHOR EX) Apply 1 application topically daily.    . ferrous sulfate 325 (65 FE) MG  tablet Take 325 mg by mouth daily with breakfast.    . fluocinonide-emollient (LIDEX-E) 0.05 % cream Apply 1 application topically 2 (two) times daily. 30 g 2  . mometasone (ELOCON) 0.1 % ointment Apply topically daily. 45 g 2  . Multiple Vitamin (MULTIVITAMIN WITH MINERALS) TABS tablet Take 1 tablet by mouth daily.     No current facility-administered medications on file prior to visit.      Past Medical History:  Diagnosis Date  . Eczema   . 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 Prematurity    "3 months" 1# 15 oz" ventilator   . Sinus infection    recently diagnosed currently on antibiotic will have completed dosage prior to surgery   . Sleep apnea    uses CPAP   . Varicose veins    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   No Known Allergies  Social History   Social History  . Marital status: Single    Spouse name: N/A  . Number of children: N/A  . Years of education: N/A   Occupational History  . 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 Smoking status: Never Smoker  . Smokeless tobacco: Never Used  . Alcohol use No  . Drug use: No  . Sexual activity: Not Asked   Other Topics Concern  . 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    Doing much better less stress   Going to grad school in august .  [place] in [employer] education .     Vitals:   01/27/16 1115  BP: 118/80  Pulse: 80  Resp: 12   Body mass index is 40.99 kg/m.    Physical Exam  Nursing note and vitals reviewed. Constitutional: She is oriented to person, place, and time. She appears well-developed. She does not  appear ill. No distress.  HENT:  Head: Atraumatic.  Mouth/Throat: Oropharynx is clear and moist and mucous membranes are normal.  Eyes: Conjunctivae are normal.  Respiratory: Effort normal and breath sounds normal. No respiratory distress.  GI: Soft. She exhibits no mass. There is no hepatomegaly. There is tenderness in the suprapubic area. There is no rigidity, no rebound, no guarding and no CVA tenderness.  Musculoskeletal: She exhibits no edema.  Neurological: She is alert and oriented to person, place, and time.  Skin: Skin is warm. No erythema.  Psychiatric: She has a normal mood and affect. Her speech is normal.  Well groomed, good eye contact.      ASSESSMENT AND PLAN:     Tiffany Roth was seen today for dysuria.  Diagnoses and all orders for this visit:  Dysuria -     POCT urinalysis dipstick  Urinary tract infection with hematuria, site unspecified -     Urine culture -     sulfamethoxazole-trimethoprim (BACTRIM DS,SEPTRA DS) 800-160 MG tablet; Take 1 tablet by mouth 2 (two) times daily.  Gross hematuria -     Urine culture   Urine dipstick BLD 3+ and protein 1+.  Possible causes of reported urinary symptoms discussed. Ucx ordered.   Empiric abx treatment started today and will be tailored according to Ucx results and susceptibility report.  Clearly instructed about warning signs. I discussed recent BMP with pt, Cr mildly elevated but at her baseline, has been 1.43 in 05/2011.   No prior Hx of gross hematuria, recommended having urine micro in about 2 weeks, this can be done at her nephrologist's office.  F/U if symptoms persist.     -Tiffany Roth was advised to return or notify a doctor immediately if symptoms worsen or persist or new concerns arise.       [name], MD  [facility]. [place] office.

## 2016-01-27 NOTE — Patient Instructions (Signed)
  [name] I have seen you today for an acute visit.  1. Dysuria  - POCT urinalysis dipstick  2. Urinary tract infection with hematuria, site unspecified  - Urine culture - sulfamethoxazole-trimethoprim (BACTRIM DS,SEPTRA DS) 800-160 MG tablet; Take 1 tablet by mouth 2 (two) times daily.  Dispense: 6 tablet; Refill: 0  3. Gross hematuria  - Urine culture   Keep appointment with kidney doctor.    Adequate fluid intake, avoid holding urine for long hours, and over the counter Vit C OR cranberry capsules might help.  Today we will treat empirically with antibiotic, which we might need to change when urine culture comes back depending of bacteria susceptibility.  Seek immediate medical attention if severe abdominal pain, vomiting, fever/chills, or worsening symptoms. F/U if symptomatic are not any better after 2-3 days of antibiotic treatment.  Please be sure you have an appointment already scheduled with your PCP before you leave today.

## 2016-01-30 ENCOUNTER — Encounter: Payer: Self-pay | Admitting: Family Medicine

## 2016-01-30 LAB — URINE CULTURE

## 2016-02-04 ENCOUNTER — Emergency Department (HOSPITAL_COMMUNITY)
Admission: EM | Admit: 2016-02-04 | Discharge: 2016-02-05 | Disposition: A | Discharge status: Home / Self Care | Payer: BC Managed Care – PPO | Attending: Emergency Medicine | Admitting: Emergency Medicine

## 2016-02-04 ENCOUNTER — Emergency Department (HOSPITAL_COMMUNITY): Payer: BC Managed Care – PPO

## 2016-02-04 ENCOUNTER — Encounter (HOSPITAL_COMMUNITY): Payer: Self-pay | Admitting: Emergency Medicine

## 2016-02-04 DIAGNOSIS — E039 Hypothyroidism, unspecified: Secondary | ICD-10-CM | POA: Insufficient documentation

## 2016-02-04 DIAGNOSIS — R1013 Epigastric pain: Secondary | ICD-10-CM | POA: Diagnosis present

## 2016-02-04 DIAGNOSIS — K529 Noninfective gastroenteritis and colitis, unspecified: Secondary | ICD-10-CM | POA: Insufficient documentation

## 2016-02-04 DIAGNOSIS — Z903 Acquired absence of stomach [part of]: Secondary | ICD-10-CM | POA: Insufficient documentation

## 2016-02-04 MED ORDER — IOPAMIDOL (ISOVUE-300) INJECTION 61%
INTRAVENOUS | Status: AC
Start: 2016-02-04 — End: 2016-02-05
  Administered 2016-02-05: 100 mL via INTRAVENOUS
  Filled 2016-02-04: qty 100

## 2016-02-04 MED ORDER — SODIUM CHLORIDE 0.9 % IV BOLUS (SEPSIS)
1000.0000 mL | Freq: Once | INTRAVENOUS | Status: AC
  Administered 2016-02-04: 1000 mL via INTRAVENOUS

## 2016-02-04 MED ORDER — MORPHINE SULFATE (PF) 4 MG/ML IV SOLN
4.0000 mg | Freq: Once | INTRAVENOUS | Status: AC
  Administered 2016-02-04: 4 mg via INTRAVENOUS
  Filled 2016-02-04: qty 1

## 2016-02-04 MED ORDER — ONDANSETRON HCL 4 MG/2ML IJ SOLN
4.0000 mg | Freq: Once | INTRAMUSCULAR | Status: AC
  Administered 2016-02-04: 4 mg via INTRAVENOUS
  Filled 2016-02-04: qty 2

## 2016-02-04 MED ORDER — SODIUM CHLORIDE 0.9 % IJ SOLN
INTRAMUSCULAR | Status: AC
  Filled 2016-02-04: qty 50

## 2016-02-04 NOTE — ED Notes (Signed)
Requested urine

## 2016-02-04 NOTE — ED Triage Notes (Signed)
Pt c/o nausea, vomiting, diarrhea, bilateral abdominal pain since today. 4 episodes of emesis and 8 episodes of diarrhea today. Pt has not been able to keep any fluids down. Hx of bariatric surgery.

## 2016-02-05 ENCOUNTER — Encounter (HOSPITAL_COMMUNITY): Payer: Self-pay

## 2016-02-05 ENCOUNTER — Emergency Department (HOSPITAL_COMMUNITY): Payer: BC Managed Care – PPO

## 2016-02-05 LAB — LIPASE, BLOOD: LIPASE: 31 U/L (ref 11–51)

## 2016-02-05 LAB — CBC
HEMATOCRIT: 43.5 % (ref 36.0–46.0)
Hemoglobin: 14.5 g/dL (ref 12.0–15.0)
MCH: 30.5 pg (ref 26.0–34.0)
MCHC: 33.3 g/dL (ref 30.0–36.0)
MCV: 91.4 fL (ref 78.0–100.0)
Platelets: 284 10*3/uL (ref 150–400)
RBC: 4.76 MIL/uL (ref 3.87–5.11)
RDW: 12.3 % (ref 11.5–15.5)
WBC: 10.3 10*3/uL (ref 4.0–10.5)

## 2016-02-05 LAB — I-STAT BETA HCG BLOOD, ED (MC, WL, AP ONLY)

## 2016-02-05 LAB — COMPREHENSIVE METABOLIC PANEL
ALBUMIN: 3.7 g/dL (ref 3.5–5.0)
ALT: 19 U/L (ref 14–54)
AST: 27 U/L (ref 15–41)
Alkaline Phosphatase: 97 U/L (ref 38–126)
Anion gap: 9 (ref 5–15)
BUN: 15 mg/dL (ref 6–20)
CHLORIDE: 112 mmol/L — AB (ref 101–111)
CO2: 22 mmol/L (ref 22–32)
Calcium: 8.9 mg/dL (ref 8.9–10.3)
Creatinine, Ser: 1.33 mg/dL — ABNORMAL HIGH (ref 0.44–1.00)
GFR calc Af Amer: >60 mL/min (ref >60)
GFR, EST NON AFRICAN AMERICAN: 54 mL/min — AB (ref >60)
GLUCOSE: 114 mg/dL — AB (ref 65–99)
POTASSIUM: 3.9 mmol/L (ref 3.5–5.1)
SODIUM: 143 mmol/L (ref 135–145)
Total Bilirubin: 0.9 mg/dL (ref 0.3–1.2)
Total Protein: 7.3 g/dL (ref 6.5–8.1)

## 2016-02-05 MED ORDER — ONDANSETRON 8 MG PO TBDP: ORAL_TABLET | ORAL | 0 refills | Status: DC

## 2016-02-05 MED ORDER — IOPAMIDOL (ISOVUE-300) INJECTION 61%
100.0000 mL | Freq: Once | INTRAVENOUS | Status: AC | PRN
  Administered 2016-02-05: 100 mL via INTRAVENOUS

## 2016-02-05 NOTE — Discharge Instructions (Signed)
Zofran as prescribed as needed for nausea.  Clear liquid diet for the next 12 hours, then slowly advance to crackers and toast. If tolerated, then advance from there.  Return to the emergency department if you develop worsening pain, high fevers, bloody stools, or other new and concerning symptoms.

## 2016-02-05 NOTE — ED Provider Notes (Signed)
Emergency Department Provider Note   I have reviewed the triage vital signs and the nursing notes.   HISTORY  Chief Complaint Emesis; Abdominal Pain; and Diarrhea   HPI Tiffany Roth is a 25 y.o. female with PMH of gastric bypass presents to the ED for evaluation of sudden onset epigastric pain, vomiting, and diarrhea. Gastric bypass was in 2016. No bilious or bloody emesis. No fever or chills. No known sick contacts. The patient does have an associated HA with no photophobia, weakness, or numbness. No prior history of similar abdominal pain. No exacerbating or alleviating factors.   Past Medical History:  Diagnosis Date  . Eczema   . 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 Prematurity    "3 months" 1# 15 oz" ventilator   . Sinus infection    recently diagnosed currently on antibiotic will have completed dosage prior to surgery   . Sleep apnea    uses CPAP   . Varicose veins    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    Patient Active Problem List   Diagnosis Date Noted  . Bariatric surgery status 04/22/2015  . 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 Morbid obesity with BMI of 50.0-59.9, adult ([place]) 06/02/2014  . Prediabetes 06/02/2014  . 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 Maxillary sinusitis, acute 05/03/2014  . Eczema 05/03/2014  . Strep pharyngitis 07/19/2013  . Infectious eczematoid dermatitis 10/11/2012  . OSA (obstructive sleep apnea) 04/12/2012  . Vomiting and diarrhea 03/19/2012  . Stress at work 12/19/2011  . BMI 40.0-44.9, adult ([place]) 12/19/2011  . Weight gain 11/25/2011  . Prematurity 05/22/2010  . Prematurity   . LIPOMA 08/05/2009  . OBESITY 08/05/2009  . Hypothyroidism 10/02/2006  . VARICOSE VEIN 10/02/2006  . ECZEMA 10/02/2006     Past Surgical History:  Procedure Laterality Date  . LAPAROSCOPIC GASTRIC SLEEVE RESECTION N/A 06/02/2014   Procedure: LAPAROSCOPIC GASTRIC SLEEVE RESECTION;  Surgeon: Greer Pickerel, MD;  Location: [facility];  Service: General;  Laterality: N/A;  . UPPER GI ENDOSCOPY N/A 06/02/2014   Procedure: UPPER GI ENDOSCOPY;  Surgeon: Greer Pickerel, MD;  Location: [facility];  Service: General;  Laterality: N/A;  . varicose veins      surgically repaired 2011    Current Outpatient Rx  . Order #: [id number] Class: Normal  . Order #: [id number] Class: Normal  . Order #: [id number] Class: Normal  . Order #: [id number] Class: Normal  . Order #: [id number] Class: Print    Allergies Ibuprofen  Family History  Problem Relation Age of Onset  . Thyroid disease Mother   . Hypertension Mother     Social History Social History  Substance Use Topics  . Smoking status: Never Smoker  . Smokeless tobacco: Never Used  . Alcohol use No    Review of Systems  Constitutional: No fever/chills. Eyes: No visual changes. ENT: No sore throat. Cardiovascular: Denies chest pain. Respiratory: Denies shortness of breath. Gastrointestinal: Positive abdominal pain. Positive nausea and vomiting. Positive diarrhea.  No constipation. Genitourinary: Negative for dysuria.  Musculoskeletal: Negative for back pain. Skin: Negative for rash. Neurological: Negative for headaches, focal weakness or numbness.  10-point ROS otherwise negative.  ____________________________________________   PHYSICAL EXAM:  VITAL SIGNS: ED Triage Vitals  Enc Vitals Group  BP 02/04/16 1942 114/95     Pulse Rate 02/04/16 1942 80     Resp 02/04/16 1942 18     Temp 02/04/16 1942 97.6 F (36.4 C)     Temp Source 02/04/16 1942 Oral     SpO2 02/04/16 1942 95 %     Weight 02/04/16 1942 225 lb (102.1 kg)     Height 02/04/16 1942 5\' 1"  (1.549 m)     Pain Score 02/04/16 2004 8   Constitutional: Alert and oriented. Well appearing and in no acute  distress. Eyes: Conjunctivae are normal.  Head: Atraumatic. Nose: No congestion/rhinnorhea. Mouth/Throat: Mucous membranes are dry. Oropharynx non-erythematous. Neck: No stridor.   Cardiovascular: Normal rate, regular rhythm. Good peripheral circulation. Grossly normal heart sounds.   Respiratory: Normal respiratory effort.  No retractions. Lungs CTAB. Gastrointestinal: Soft with epigastric tenderness. Positive gaurading but no rebound. No distention.  Musculoskeletal: No lower extremity tenderness nor edema. No gross deformities of extremities. Neurologic:  Normal speech and language. No gross focal neurologic deficits are appreciated.  Skin:  Skin is warm, dry and intact. No rash noted.   ____________________________________________   LABS (all labs ordered are listed, but only abnormal results are displayed)  Labs Reviewed  COMPREHENSIVE METABOLIC PANEL - Abnormal; Notable for the following:       Result Value   Chloride 112 (*)    Glucose, Bld 114 (*)    Creatinine, Ser 1.33 (*)    GFR calc non Af Amer 54 (*)    All other components within normal limits  LIPASE, BLOOD  CBC  I-STAT BETA HCG BLOOD, ED ([facility], [facility], [facility] ONLY)   ____________________________________________  RADIOLOGY  Ct Abdomen Pelvis W Contrast  Result Date: 02/05/2016 CLINICAL DATA:  Epigastric pain.  History of bariatric surgery. EXAM: CT ABDOMEN AND PELVIS WITH CONTRAST TECHNIQUE: Multidetector CT imaging of the abdomen and pelvis was performed using the standard protocol following bolus administration of intravenous contrast. CONTRAST:  165mL ISOVUE-300 IOPAMIDOL (ISOVUE-300) INJECTION 61% COMPARISON:  None. FINDINGS: Lower chest: No pulmonary nodules. No visible pleural or pericardial effusion. Hepatobiliary: Normal hepatic size and contours without focal liver lesion. No perihepatic ascites. No intra- or extrahepatic biliary dilatation. Normal gallbladder. Pancreas: Normal pancreatic contours and enhancement.  No peripancreatic fluid collection or pancreatic ductal dilatation. Spleen: Normal. Adrenals/Urinary Tract: Normal adrenal glands. No hydronephrosis or solid renal mass. Stomach/Bowel: Findings of gastric bypass are seen. The appendix is normal. There is fluid throughout the colon and small bowel. No evidence of acute inflammation. No evidence of obstruction. No intra-abdominal fluid collection. Vascular/Lymphatic: Normal course and caliber of the major abdominal vessels. Numerous small, less than 1 hour area and retroperitoneal contents. Reproductive: There is a hypodense mass of the right lower uterine segment and a heterogeneous left uterine mass that measures up to 6 x 4 cm. Musculoskeletal: No lytic or blastic osseous lesion. Normal visualized extrathoracic and extraperitoneal soft tissues. Other: No contributory non-categorized findings. IMPRESSION: Diffusely fluid-filled small bowel and colon, suggesting acute diarrheal illness. Numerous subcentimeter retroperitoneal and mesenteric lymph nodes are likely reactive. Electronically Signed   By: Ulyses Jarred M.D.   On: 02/05/2016 01:47    ____________________________________________   PROCEDURES  Procedure(s) performed:   Procedures  None ____________________________________________   INITIAL IMPRESSION / ASSESSMENT AND PLAN / ED COURSE  Pertinent labs & imaging results that were available during my care of the patient were reviewed by me and considered in my medical decision making (see chart for details).  Patient presents to  the ED for evaluation of acute onset abdominal pain, diarrhea, and vomiting. She has guarding in the epigastric region with no rebound. Not peritoneal. With with surgical history and guarding I plan to follow CT abdomen/pelvis and reassess.   01:35 AM Updated parents at bedside. Patient in [place]. Care transferred to Dr. Stark Jock who will follow CT and assist with disposition.     ____________________________________________  FINAL CLINICAL IMPRESSION(S) / ED DIAGNOSES  Final diagnoses:  Acute gastroenteritis  History of sleeve gastrectomy     MEDICATIONS GIVEN DURING THIS VISIT:  Medications  morphine 4 MG/ML injection 4 mg (4 mg Intravenous Given 02/04/16 2351)  ondansetron (ZOFRAN) injection 4 mg (4 mg Intravenous Given 02/04/16 2351)  sodium chloride 0.9 % bolus 1,000 mL (0 mLs Intravenous Stopped 02/05/16 0206)  iopamidol (ISOVUE-300) 61 % injection 100 mL (100 mLs Intravenous Contrast Given 02/05/16 0115)     NEW OUTPATIENT MEDICATIONS STARTED DURING THIS VISIT:  Discharge Medication List as of 02/05/2016  2:06 AM    START taking these medications   Details  ondansetron (ZOFRAN ODT) 8 MG disintegrating tablet 8mg  ODT q4 hours prn nausea, Print          Note:  This document was prepared using Dragon voice recognition software and may include unintentional dictation errors.  Nanda Quinton, MD Emergency Medicine   Margette Fast, MD 02/05/16 [phone]

## 2016-02-05 NOTE — ED Provider Notes (Signed)
Patient is a 25 year old female with history of gastric sleeve. She presents for evaluation of vomiting and diarrhea. She was initially seen by Dr. Laverta Baltimore and workup was initiated. Her care was signed out to me awaiting results of the CT scan. This shows fluid filled bowel consistent with gastroenteritis, however no emergent or surgical findings. She appears to be feeling better after fluids and medications. She will be discharged with Zofran and as needed return.   Veryl Speak, MD 02/05/16 [phone]

## 2016-03-03 ENCOUNTER — Institutional Professional Consult (permissible substitution): Payer: BC Managed Care – PPO | Admitting: Pulmonary Disease

## 2016-05-20 ENCOUNTER — Encounter: Payer: Self-pay | Admitting: Emergency Medicine

## 2016-05-20 ENCOUNTER — Encounter: Payer: Self-pay | Admitting: Internal Medicine

## 2016-05-20 ENCOUNTER — Ambulatory Visit (INDEPENDENT_AMBULATORY_CARE_PROVIDER_SITE_OTHER): Payer: BC Managed Care – PPO | Admitting: Internal Medicine

## 2016-05-20 VITALS — BP 100/78 | HR 93 | Ht 61.0 in | Wt 218.0 lb

## 2016-05-20 DIAGNOSIS — J019 Acute sinusitis, unspecified: Secondary | ICD-10-CM

## 2016-05-20 DIAGNOSIS — S0090XA Unspecified superficial injury of unspecified part of head, initial encounter: Secondary | ICD-10-CM | POA: Diagnosis not present

## 2016-05-20 DIAGNOSIS — E232 Diabetes insipidus: Secondary | ICD-10-CM

## 2016-05-20 DIAGNOSIS — S0990XA Unspecified injury of head, initial encounter: Secondary | ICD-10-CM

## 2016-05-20 MED ORDER — AMOXICILLIN-POT CLAVULANATE 875-125 MG PO TABS: 1.0000 | ORAL_TABLET | Freq: Two times a day (BID) | ORAL | 0 refills | Status: DC

## 2016-05-20 NOTE — Patient Instructions (Signed)
We will treat you for bacterial sinusitis or a complicated viral respiratory infection. Her chest sounds clear today. It is possible that some of your headache is from hitting her head and that should get better with time also. Continues warm compresses to face nasal saline decongestants and nasal cortisone to help the congestion. Expect improvement in the next 3-5 days If getting worse or fever etc. contact health care team for follow-up.    Sinusitis, Adult Sinusitis is soreness and inflammation of your sinuses. Sinuses are hollow spaces in the bones around your face. Your sinuses are located:  Around your eyes.  In the middle of your forehead.  Behind your nose.  In your cheekbones. Your sinuses and nasal passages are lined with a stringy fluid (mucus). Mucus normally drains out of your sinuses. When your nasal tissues become inflamed or swollen, the mucus can become trapped or blocked so air cannot flow through your sinuses. This allows bacteria, viruses, and funguses to grow, which leads to infection. Sinusitis can develop quickly and last for 7?10 days (acute) or for more than 12 weeks (chronic). Sinusitis often develops after a cold. What are the causes? This condition is caused by anything that creates swelling in the sinuses or stops mucus from draining, including:  Allergies.  Asthma.  Bacterial or viral infection.  Abnormally shaped bones between the nasal passages.  Nasal growths that contain mucus (nasal polyps).  Narrow sinus openings.  Pollutants, such as chemicals or irritants in the air.  A foreign object stuck in the nose.  A fungal infection. This is rare. What increases the risk? The following factors may make you more likely to develop this condition:  Having allergies or asthma.  Having had a recent cold or respiratory tract infection.  Having structural deformities or blockages in your nose or sinuses.  Having a weak immune system.  Doing a  lot of swimming or diving.  Overusing nasal sprays.  Smoking. What are the signs or symptoms? The main symptoms of this condition are pain and a feeling of pressure around the affected sinuses. Other symptoms include:  Upper toothache.  Earache.  Headache.  Bad breath.  Decreased sense of smell and taste.  A cough that may get worse at night.  Fatigue.  Fever.  Thick drainage from your nose. The drainage is often green and it may contain pus (purulent).  Stuffy nose or congestion.  Postnasal drip. This is when extra mucus collects in the throat or back of the nose.  Swelling and warmth over the affected sinuses.  Sore throat.  Sensitivity to light. How is this diagnosed? This condition is diagnosed based on symptoms, a medical history, and a physical exam. To find out if your condition is acute or chronic, your health care provider may:  Look in your nose for signs of nasal polyps.  Tap over the affected sinus to check for signs of infection.  View the inside of your sinuses using an imaging device that has a light attached (endoscope). If your health care provider suspects that you have chronic sinusitis, you may also:  Be tested for allergies.  Have a sample of mucus taken from your nose (nasal culture) and checked for bacteria.  Have a mucus sample examined to see if your sinusitis is related to an allergy. If your sinusitis does not respond to treatment and it lasts longer than 8 weeks, you may have an MRI or CT scan to check your sinuses. These scans also help to determine  how severe your infection is. In rare cases, a bone biopsy may be done to rule out more serious types of fungal sinus disease. How is this treated? Treatment for sinusitis depends on the cause and whether your condition is chronic or acute. If a virus is causing your sinusitis, your symptoms will go away on their own within 10 days. You may be given medicines to relieve your symptoms,  including:  Topical nasal decongestants. They shrink swollen nasal passages and let mucus drain from your sinuses.  Antihistamines. These drugs block inflammation that is triggered by allergies. This can help to ease swelling in your nose and sinuses.  Topical nasal corticosteroids. These are nasal sprays that ease inflammation and swelling in your nose and sinuses.  Nasal saline washes. These rinses can help to get rid of thick mucus in your nose. If your condition is caused by bacteria, you will be given an antibiotic medicine. If your condition is caused by a fungus, you will be given an antifungal medicine. Surgery may be needed to correct underlying conditions, such as narrow nasal passages. Surgery may also be needed to remove polyps. Follow these instructions at home: Medicines   Take, use, or apply over-the-counter and prescription medicines only as told by your health care provider. These may include nasal sprays.  If you were prescribed an antibiotic medicine, take it as told by your health care provider. Do not stop taking the antibiotic even if you start to feel better. Hydrate and Humidify   Drink enough water to keep your urine clear or pale yellow. Staying hydrated will help to thin your mucus.  Use a cool mist humidifier to keep the humidity level in your home above 50%.  Inhale steam for 10-15 minutes, 3-4 times a day or as told by your health care provider. You can do this in the bathroom while a hot shower is running.  Limit your exposure to cool or dry air. Rest   Rest as much as possible.  Sleep with your head raised (elevated).  Make sure to get enough sleep each night. General instructions   Apply a warm, moist washcloth to your face 3-4 times a day or as told by your health care provider. This will help with discomfort.  Wash your hands often with soap and water to reduce your exposure to viruses and other germs. If soap and water are not available, use  hand sanitizer.  Do not smoke. Avoid being around people who are smoking (secondhand smoke).  Keep all follow-up visits as told by your health care provider. This is important. Contact a health care provider if:  You have a fever.  Your symptoms get worse.  Your symptoms do not improve within 10 days. Get help right away if:  You have a severe headache.  You have persistent vomiting.  You have pain or swelling around your face or eyes.  You have vision problems.  You develop confusion.  Your neck is stiff.  You have trouble breathing. This information is not intended to replace advice given to you by your health care provider. Make sure you discuss any questions you have with your health care provider. Document Released: 02/21/2005 Document Revised: 10/18/2015 Document Reviewed: 12/17/2014 Elsevier Interactive Patient Education  2017 [employer].

## 2016-05-20 NOTE — Progress Notes (Signed)
Chief Complaint  Patient presents with  . Cough    started 2 weeks   . Nasal Congestion  . Headache    HPI: Tiffany Roth 26 y.o.  Like a  Cough  At the beginning .  Onset about 2 weeks ago .     Then head congestion hit head   pciking up . Some thing in her kitchen no loc a week ago   Congestion for a week .  Head hurt when coughs  In frontal area .   lef more then right ( hit on left )   Yellow mucous.  And urine dark .  Marland Kitchen  Face pressures and left congestion more  No neuro sx visito issues fever chills Under care for Dr.  Jonnie Finner for [facility]  ROS: See pertinent positives and negatives per HPI. No cp sob wheezing sycope  dizziness  Past Medical History:  Diagnosis Date  . Eczema   . 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 Prematurity    "3 months" 1# 15 oz" ventilator   . Sinus infection    recently diagnosed currently on antibiotic will have completed dosage prior to surgery   . Sleep apnea    uses CPAP   . 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 Thyroid disease Mother   . Hypertension Mother     Social History   Social History  . Marital status: Single    Spouse name: N/A  . Number of children: N/A  . Years of education: N/A   Occupational History  . 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 Smoking status: Never Smoker  . Smokeless tobacco: Never Used  . Alcohol use No  . Drug use: No  . Sexual activity: Not Asked   Other Topics Concern  . 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    Doing much better  less stress   Going to grad school in august .  [place] in [employer] education .     Outpatient Medications Prior to Visit  Medication Sig Dispense Refill  . desmopressin (DDAVP) 0.01 % SOLN Place 1 spray into the nose 2 (two) times daily. 1 Bottle 1  . cyclobenzaprine (FLEXERIL) 5 MG tablet Take 1 tablet daily for headache. (Patient not taking: Reported on 02/04/2016) 20 tablet 0  . fluocinonide-emollient (LIDEX-E) 0.05 % cream Apply 1 application topically 2 (two) times daily. (Patient not taking: Reported on 02/04/2016) 30 g 2  . mometasone (ELOCON) 0.1 % ointment Apply topically daily. (Patient not taking: Reported on 02/04/2016) 45 g 2  . ondansetron (ZOFRAN ODT) 8 MG disintegrating tablet 8mg  ODT q4 hours prn nausea (Patient not taking: Reported on 05/20/2016) 6 tablet 0   No facility-administered medications prior to visit.      EXAM:  BP 100/78 (BP Location: Right Arm, Patient Position: Sitting, Cuff Size: Large)   Pulse 93   Ht 5\' 1"  (1.549 m)  Wt 218 lb (98.9 kg)   SpO2 98%   BMI 41.19 kg/m   Body mass index is 41.19 kg/m. WDWN in NAD  quiet respirations; very nasally congested  somewhat hoarse. Non toxic . HEENT: Normocephalic ;atraumatic , Eyes;  PERRL, EOMs  Full, lids and conjunctiva clear,,Ears: no deformities, canals nl, TM landmarks normal, Nose: no deformity crusted mucoid dc inc turbinates  discharge but congested;face minimally tender Mouth : OP clear without lesion or edema . Mild erythema  Neck: Supple without adenopathy or masses or bruits Chest:  Clear to A  without wheezes rales or rhonchi CV:  S1-S2 no gallops or murmurs peripheral perfusion is normal Skin :nl perfusion and no acute rashes    pigment changes from previous eczema MS: moves all extremities without noticeable focal  abnormality PSYCH: pleasant and cooperative, no obvious depression or anxiety  ASSESSMENT AND PLAN:  Discussed the following assessment and plan:  Acute sinusitis with symptoms  greater than 10 days - complicated uri resp.   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  Patient Instructions  We will treat you for bacterial sinusitis or a complicated viral respiratory infection. Her chest sounds clear today. It is possible that some of your headache is from hitting her head and that should get better with time also. Continues warm compresses to face nasal saline decongestants and nasal cortisone to help the congestion. Expect improvement in the next 3-5 days If getting worse or fever etc. contact health care team for follow-up.    Sinusitis, Adult Sinusitis is soreness and inflammation of your sinuses. Sinuses are hollow spaces in the bones around your face. Your sinuses are located:  Around your eyes.  In the middle of your forehead.  Behind your nose.  In your cheekbones. Your sinuses and nasal passages are lined with a stringy fluid (mucus). Mucus normally drains out of your sinuses. When your nasal tissues become inflamed or swollen, the mucus can become trapped or blocked so air cannot flow through your sinuses. This allows bacteria, viruses, and funguses to grow, which leads to infection. Sinusitis can develop quickly and last for 7?10 days (acute) or for more than 12 weeks (chronic). Sinusitis often develops after a cold. What are the causes? This condition is caused by anything that creates swelling in the sinuses or stops mucus from draining, including:  Allergies.  Asthma.  Bacterial or viral infection.  Abnormally shaped bones between the nasal passages.  Nasal growths that contain mucus (nasal polyps).  Narrow sinus openings.  Pollutants, such as chemicals or irritants in the air.  A foreign object stuck in the nose.  A fungal infection. This is  rare. What increases the risk? The following factors may make you more likely to develop this condition:  Having allergies or asthma.  Having had a recent cold or respiratory tract infection.  Having structural deformities or blockages in your nose or sinuses.  Having a weak immune system.  Doing a lot of swimming or diving.  Overusing nasal sprays.  Smoking. What are the signs or symptoms? The main symptoms of this condition are pain and a feeling of pressure around the affected sinuses. Other symptoms include:  Upper toothache.  Earache.  Headache.  Bad breath.  Decreased sense of smell and taste.  A cough  that may get worse at night.  Fatigue.  Fever.  Thick drainage from your nose. The drainage is often green and it may contain pus (purulent).  Stuffy nose or congestion.  Postnasal drip. This is when extra mucus collects in the throat or back of the nose.  Swelling and warmth over the affected sinuses.  Sore throat.  Sensitivity to light. How is this diagnosed? This condition is diagnosed based on symptoms, a medical history, and a physical exam. To find out if your condition is acute or chronic, your health care provider may:  Look in your nose for signs of nasal polyps.  Tap over the affected sinus to check for signs of infection.  View the inside of your sinuses using an imaging device that has a light attached (endoscope). If your health care provider suspects that you have chronic sinusitis, you may also:  Be tested for allergies.  Have a sample of mucus taken from your nose (nasal culture) and checked for bacteria.  Have a mucus sample examined to see if your sinusitis is related to an allergy. If your sinusitis does not respond to treatment and it lasts longer than 8 weeks, you may have an MRI or CT scan to check your sinuses. These scans also help to determine how severe your infection is. In rare cases, a bone biopsy may be done to rule out  more serious types of fungal sinus disease. How is this treated? Treatment for sinusitis depends on the cause and whether your condition is chronic or acute. If a virus is causing your sinusitis, your symptoms will go away on their own within 10 days. You may be given medicines to relieve your symptoms, including:  Topical nasal decongestants. They shrink swollen nasal passages and let mucus drain from your sinuses.  Antihistamines. These drugs block inflammation that is triggered by allergies. This can help to ease swelling in your nose and sinuses.  Topical nasal corticosteroids. These are nasal sprays that ease inflammation and swelling in your nose and sinuses.  Nasal saline washes. These rinses can help to get rid of thick mucus in your nose. If your condition is caused by bacteria, you will be given an antibiotic medicine. If your condition is caused by a fungus, you will be given an antifungal medicine. Surgery may be needed to correct underlying conditions, such as narrow nasal passages. Surgery may also be needed to remove polyps. Follow these instructions at home: Medicines   Take, use, or apply over-the-counter and prescription medicines only as told by your health care provider. These may include nasal sprays.  If you were prescribed an antibiotic medicine, take it as told by your health care provider. Do not stop taking the antibiotic even if you start to feel better. Hydrate and Humidify   Drink enough water to keep your urine clear or pale yellow. Staying hydrated will help to thin your mucus.  Use a cool mist humidifier to keep the humidity level in your home above 50%.  Inhale steam for 10-15 minutes, 3-4 times a day or as told by your health care provider. You can do this in the bathroom while a hot shower is running.  Limit your exposure to cool or dry air. Rest   Rest as much as possible.  Sleep with your head raised (elevated).  Make sure to get enough sleep  each night. General instructions   Apply a warm, moist washcloth to your face 3-4 times a day or as told by your  health care provider. This will help with discomfort.  Wash your hands often with soap and water to reduce your exposure to viruses and other germs. If soap and water are not available, use hand sanitizer.  Do not smoke. Avoid being around people who are smoking (secondhand smoke).  Keep all follow-up visits as told by your health care provider. This is important. Contact a health care provider if:  You have a fever.  Your symptoms get worse.  Your symptoms do not improve within 10 days. Get help right away if:  You have a severe headache.  You have persistent vomiting.  You have pain or swelling around your face or eyes.  You have vision problems.  You develop confusion.  Your neck is stiff.  You have trouble breathing. This information is not intended to replace advice given to you by your health care provider. Make sure you discuss any questions you have with your health care provider. Document Released: 02/21/2005 Document Revised: 10/18/2015 Document Reviewed: 12/17/2014 Elsevier Interactive Patient Education  2017 Frohna K. Panosh M.D.

## 2016-08-11 ENCOUNTER — Encounter: Payer: Self-pay | Admitting: Family Medicine

## 2016-08-11 ENCOUNTER — Ambulatory Visit (INDEPENDENT_AMBULATORY_CARE_PROVIDER_SITE_OTHER): Payer: BC Managed Care – PPO | Admitting: Family Medicine

## 2016-08-11 VITALS — BP 110/72 | HR 96 | Temp 97.9°F | Wt 221.6 lb

## 2016-08-11 DIAGNOSIS — E232 Diabetes insipidus: Secondary | ICD-10-CM

## 2016-08-11 DIAGNOSIS — L209 Atopic dermatitis, unspecified: Secondary | ICD-10-CM

## 2016-08-11 DIAGNOSIS — L301 Dyshidrosis [pompholyx]: Secondary | ICD-10-CM

## 2016-08-11 MED ORDER — FLUOCINONIDE-E 0.05 % EX CREA: 1.0000 "application " | TOPICAL_CREAM | Freq: Two times a day (BID) | CUTANEOUS | 2 refills | Status: DC

## 2016-08-11 MED ORDER — HYDROCORTISONE 0.5 % EX CREA: 1.0000 "application " | TOPICAL_CREAM | Freq: Two times a day (BID) | CUTANEOUS | 0 refills | Status: DC

## 2016-08-11 NOTE — Progress Notes (Signed)
Subjective:    Patient ID: Tiffany Roth, female    DOB: 07-25-1990, 26 y.o.   MRN: [id number]  HPI  Tiffany Roth is a 26 year old female who presents today with a flare in her eczema.  She has noticed this on her hands and has had a flare on her face.  This has been going on for 2 to 3 weeks and her mouth, lips, and eyelids are dry.  Associated pruritis is noted.  She believes a trigger is stress with work in her job. Treatment at home includes aquaphor which has provided limited benefit.  She reports alleviating factor of a plant based diet but she has returned to regular diet and has noticed flare.     History of diabetes insipidus is noted and she is followed by Dr. Moshe Cipro, history of bariatric surgery  Review of Systems  Constitutional: Negative for chills, fatigue and fever.  Respiratory: Negative for cough, shortness of breath and wheezing.   Cardiovascular: Negative for chest pain and palpitations.  Gastrointestinal: Negative for abdominal pain, constipation, diarrhea, nausea and vomiting.  Genitourinary: Negative for dysuria.  Musculoskeletal: Negative for myalgias.  Skin:       Flare of eczema  Neurological: Negative for dizziness and headaches.   Past Medical History:  Diagnosis Date  . Eczema   . 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 Prematurity    "3 months" 1# 15 oz" ventilator   . Sinus infection    recently diagnosed currently on antibiotic will have completed dosage prior to surgery   . Sleep apnea    uses CPAP   . Varicose veins    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     Social History   Social History  . Marital status: Single    Spouse name: N/A  . Number of children: N/A  . Years of education: N/A   Occupational History  . 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 Smoking status: Never Smoker  . Smokeless tobacco: Never Used  . Alcohol use No  . Drug use: No  . Sexual activity: Not on file   Other Topics Concern  . 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    Doing much better less stress   Going to grad school in august .  [place] in [employer] education .     Past Surgical History:  Procedure Laterality Date  . LAPAROSCOPIC GASTRIC SLEEVE RESECTION N/A 06/02/2014   Procedure: LAPAROSCOPIC GASTRIC SLEEVE RESECTION;  Surgeon: Greer Pickerel, MD;  Location: [facility];  Service: General;  Laterality: N/A;  . UPPER GI ENDOSCOPY N/A 06/02/2014   Procedure: UPPER GI ENDOSCOPY;  Surgeon: Greer Pickerel, MD;  Location: [facility];  Service: General;  Laterality: N/A;  . varicose veins      surgically repaired 2011    Family History  Problem Relation Age of Onset  . Thyroid disease Mother   . Hypertension Mother     Allergies  Allergen Reactions  . Ibuprofen Other (See Comments)  DR told her not to take    Current Outpatient Prescriptions on File Prior to Visit  Medication Sig Dispense Refill  . amoxicillin-clavulanate (AUGMENTIN) 875-125 MG tablet Take 1 tablet by mouth every 12 (twelve) hours. 14 tablet 0  . cyclobenzaprine (FLEXERIL) 5 MG tablet Take 1 tablet daily for headache. 20 tablet 0  . desmopressin (DDAVP) 0.01 % SOLN Place 1 spray into the nose 2 (two) times daily. 1 Bottle 1  . fluocinonide-emollient (LIDEX-E) 0.05 % cream Apply 1 application topically 2 (two) times daily. 30 g 2  . mometasone (ELOCON) 0.1 % ointment Apply topically daily. 45 g 2  . ondansetron (ZOFRAN ODT) 8 MG disintegrating tablet 8mg  ODT q4 hours prn nausea 6 tablet 0   No current facility-administered medications on file prior to visit.     BP 110/72  (BP Location: Left Arm, Patient Position: Sitting, Cuff Size: Large)   Pulse 96   Temp 97.9 F (36.6 C) (Oral)   Wt 221 lb 9.6 oz (100.5 kg)   SpO2 98%   BMI 41.87 kg/m        Objective:   Physical Exam  Constitutional: She is oriented to person, place, and time. She appears well-developed and well-nourished.  HENT:  Mouth/Throat: Oropharynx is clear and moist.  Dryness and cracking on her lips and eyelids  Eyes: Pupils are equal, round, and reactive to light. No scleral icterus.  Neck: Neck supple.  Cardiovascular: Normal rate and regular rhythm.   Pulmonary/Chest: Effort normal and breath sounds normal. She has no wheezes. She has no rales.  Musculoskeletal: She exhibits no edema.  Lymphadenopathy:    She has no cervical adenopathy.  Neurological: She is alert and oriented to person, place, and time.  Skin: Skin is warm and dry.  Hyperpigmented skin, with hand dermatitis with erythematous papules with cracking and fissures. No vesicles or drainage noted. Lichenification present.   Psychiatric: She has a normal mood and affect. Her behavior is normal. Judgment and thought content normal.        Assessment & Plan:  1. Dyshidrotic hand dermatitis Topical Lidex for treatment for hands and advised continued use of moisturizer. Discussed risk versus benefit of systemic steroids; concern for metabolic effects or rebound that can occur; opted for topical treatment with antihistamine for itch.  2. Atopic dermatitis, unspecified type Hydrocortisone for area around lips and face, continue use of moisturizer for lips. - hydrocortisone cream 0.5 %; Apply 1 application topically 2 (two) times daily.  Dispense: 30 g; Refill: 0  3. Diabetes insipidus ([place])  Advised use of either Allegra, Claritin, or Zyrtec for itching. Follow up with PCP in 2 weeks or sooner for further evaluation and treatment.   Delano Metz, FNP-C

## 2016-08-11 NOTE — Patient Instructions (Addendum)
Please use Lidex for hands and hydrocortisone for face. Follow up with Dr. Regis Bill in 2 weeks for further evaluation or sooner if needed. You may also use an antihistamine such as Allegra, Claritin, or Zyrtec for itching.   WE NOW OFFER    [facility] FAST TRACK!!!  SAME DAY Appointments for ACUTE CARE  Such as: Sprains, Injuries, cuts, abrasions, rashes, muscle pain, joint pain, back pain Colds, flu, sore throats, headache, allergies, cough, fever  Ear pain, sinus and eye infections Abdominal pain, nausea, vomiting, diarrhea, upset stomach Animal/insect bites  3 Easy Ways to Schedule: Walk-In Scheduling Call in scheduling Mychart Sign-up: [email or web address]

## 2016-09-06 ENCOUNTER — Telehealth: Payer: Self-pay | Admitting: Internal Medicine

## 2016-09-06 ENCOUNTER — Ambulatory Visit (INDEPENDENT_AMBULATORY_CARE_PROVIDER_SITE_OTHER): Payer: BC Managed Care – PPO | Admitting: Family Medicine

## 2016-09-06 DIAGNOSIS — Z111 Encounter for screening for respiratory tuberculosis: Secondary | ICD-10-CM | POA: Diagnosis not present

## 2016-09-06 DIAGNOSIS — Z0289 Encounter for other administrative examinations: Secondary | ICD-10-CM

## 2016-09-06 NOTE — Telephone Encounter (Signed)
Patient dropped off Health Examination Form for Dr Regis Bill to fill out.    [phone] -placed in dr's folder

## 2016-09-09 LAB — TB SKIN TEST
Induration: 0 mm
TB SKIN TEST: NEGATIVE

## 2016-09-13 NOTE — Telephone Encounter (Signed)
Pt calling to check the status of the form and I spoke with [facility] and she state that she will get Dr. Regis Bill to sign and then will give the pt a call when ready for pick up.

## 2016-09-13 NOTE — Telephone Encounter (Signed)
Form filled out and placed in Dr. Lucy Antigua folder for distribution.

## 2016-09-14 NOTE — Telephone Encounter (Signed)
Pt notified to pick up at the front desk and notified there will a $20 charge.  Original placed at the front desk.  Copy sent to scan and I retained a copy for my records.

## 2016-09-19 ENCOUNTER — Encounter: Payer: Self-pay | Admitting: Internal Medicine

## 2016-09-19 ENCOUNTER — Ambulatory Visit (INDEPENDENT_AMBULATORY_CARE_PROVIDER_SITE_OTHER): Payer: BC Managed Care – PPO | Admitting: Internal Medicine

## 2016-09-19 VITALS — BP 120/60 | HR 113 | Temp 98.2°F | Wt 228.0 lb

## 2016-09-19 DIAGNOSIS — L259 Unspecified contact dermatitis, unspecified cause: Secondary | ICD-10-CM | POA: Diagnosis not present

## 2016-09-19 DIAGNOSIS — L301 Dyshidrosis [pompholyx]: Secondary | ICD-10-CM | POA: Diagnosis not present

## 2016-09-19 DIAGNOSIS — L859 Epidermal thickening, unspecified: Secondary | ICD-10-CM

## 2016-09-19 DIAGNOSIS — L209 Atopic dermatitis, unspecified: Secondary | ICD-10-CM

## 2016-09-19 DIAGNOSIS — E232 Diabetes insipidus: Secondary | ICD-10-CM

## 2016-09-19 MED ORDER — CLOBETASOL PROPIONATE 0.05 % EX OINT: 1.0000 "application " | TOPICAL_OINTMENT | Freq: Two times a day (BID) | CUTANEOUS | 1 refills | Status: DC

## 2016-09-19 MED ORDER — CEPHALEXIN 500 MG PO CAPS: 500.0000 mg | ORAL_CAPSULE | Freq: Three times a day (TID) | ORAL | 0 refills | Status: AC

## 2016-09-19 MED ORDER — HYDROXYZINE HCL 25 MG PO TABS: 25.0000 mg | ORAL_TABLET | Freq: Three times a day (TID) | ORAL | 0 refills | Status: DC | PRN

## 2016-09-19 NOTE — Progress Notes (Signed)
Chief Complaint  Patient presents with  . Eczema    HPI: Tiffany Roth 26 y.o. come in for SDA  Regarding her eczema on mostly her hands. She's battled this problem off and on since she was a child. However she was seen in June by nurse practitioner Dellia Cloud who treated her with Lidex topical on her hands. However [name] has been never better states that in the past seen wheezing that worked the best was oral steroids. She denies any specific other triggers her previous dermatologist Dr. Sherral Hammers has passed away so she hasn't been under care.  She had been seeing Dr. Moshe Cipro but her kidney function DI had been stable and is not been checked recently. Does want plan for a body check evaluation.  Associated fever some tenderness in the right thumb Doesn't know of any specific contact and does layer arms on things if she is at the gym rising on a desk.    ROS: See pertinent positives and negatives per HPI.  Past Medical History:  Diagnosis Date  . Eczema   . 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 Prematurity    "3 months" 1# 15 oz" ventilator   . Sinus infection    recently diagnosed currently on antibiotic will have completed dosage prior to surgery   . Sleep apnea    uses CPAP   . 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 Thyroid disease Mother   . Hypertension Mother     Social History   Social History  . Marital status: Single    Spouse name: N/A  . Number of children: N/A  . Years of education: N/A   Occupational History  . 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 Smoking status: Never Smoker  . Smokeless tobacco: Never Used  . Alcohol use No  . Drug use:  No  . Sexual activity: Not Asked   Other Topics Concern  . 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    Doing much better less stress   Going to grad school in august .  [place] in [employer] education .     Outpatient Medications Prior to Visit  Medication Sig Dispense Refill  . desmopressin (DDAVP) 0.01 % SOLN Place 1 spray into the nose 2 (two) times daily. 1 Bottle 1  . fluocinonide-emollient (LIDEX-E) 0.05 % cream Apply 1 application topically 2 (two) times daily. 30 g 2  . hydrocortisone cream 0.5 % Apply 1 application topically 2 (two) times daily. 30 g 0  . mometasone (ELOCON) 0.1 % ointment Apply topically daily. 45 g 2  . cyclobenzaprine (FLEXERIL) 5 MG tablet Take 1 tablet daily for headache. (Patient not taking: Reported on 09/19/2016) 20 tablet 0  . amoxicillin-clavulanate (AUGMENTIN) 875-125 MG tablet Take 1 tablet by mouth every 12 (twelve) hours. 14 tablet 0  . ondansetron (ZOFRAN ODT) 8 MG disintegrating tablet 8mg  ODT q4 hours prn nausea 6 tablet  0   No facility-administered medications prior to visit.      EXAM:  BP 120/60 (BP Location: Right Arm, Patient Position: Sitting, Cuff Size: Large)   Pulse (!) 113   Temp 98.2 F (36.8 C) (Oral)   Wt 228 lb (103.4 kg)   BMI 43.08 kg/m   Body mass index is 43.08 kg/m.  GENERAL: vitals reviewed and listed above, alert, oriented, appears well hydrated and in no acute distress HEENT: atraumatic, conjunctiva  clear, no obvious abnormalities on inspection of external nose and ears OP : no lesion edema or exudate She has a mild White dermatitis around her lips. Otherwise hands show lichenified chronic dermatitis of all the fingers right thumb has some redness but no discharge. There is also redness and contact dermatitis looking along the forearms. Almost up to her  elbow on the extensor surface. There are no psoriatic lesions. No edema. NECK: no obvious masses on inspection palpation  MS: moves all extremities without noticeable focal  abnormality PSYCH: pleasant and cooperative, no obvious depression or anxiety Lab Results  Component Value Date   WBC 10.3 02/04/2016   HGB 14.5 02/04/2016   HCT 43.5 02/04/2016   PLT 284 02/04/2016   GLUCOSE 114 (H) 02/04/2016   CHOL 149 03/13/2014   TRIG 138 03/13/2014   HDL 35 (L) 03/13/2014   LDLCALC 86 03/13/2014   ALT 19 02/04/2016   AST 27 02/04/2016   NA 143 02/04/2016   K 3.9 02/04/2016   CL 112 (H) 02/04/2016   CREATININE 1.33 (H) 02/04/2016   BUN 15 02/04/2016   CO2 22 02/04/2016   TSH 1.83 01/21/2016   BP Readings from Last 3 Encounters:  09/19/16 120/60  08/11/16 110/72  05/20/16 100/78    ASSESSMENT AND PLAN:  Discussed the following assessment and plan:  Dyshidrotic hand dermatitis - Plan: Ambulatory referral to Dermatology  Hyperkeratotic hand dermatitis - Plan: Ambulatory referral to Dermatology  Contact dermatitis and eczema  Atopic dermatitis, unspecified type - Plan: Ambulatory referral to Dermatology  Diabetes insipidus ([place]) I suspect she may have a combination of atopic dermatitis contact dermatitis some mild secondary infection on the right fingers. I  cephalosporin higher strength potency ointment can use occlusion at night for 2 weeks only trying to avoid oral steroids but she may come to that on this flare. Have her look at contact of her forearms extensor surfaces. I want her see a dermatologist to help delineate eczematous versus contact versus other process. For long-term management. Regard to her DI unclear he will be writing the medicine but Dr. Moshe Cipro has been managing. Can make appointment for CPX and we can do lab at that time in about 2 weeks. At the same time follow-up on her rash. Prescription given for a small amount hydroxyzine to use at night for  itching to see if this is helpful for comfort. -Patient advised to return or notify health care team  if  new concerns arise.  Patient Instructions   I am going to put a referral in to dermatology because of the severity of your problem you could have hand eczema and contact dermatitis or other things contributing to your problem. We'll treat with antibiotic for bacterial infection on top of the inflammation You can use this new ointment with cotton gloves on at night for 2 weeks. That can be equivalent to oral cortisone. Schedule for a checkup with full set of labs  At the visit .  In the next 2 months  We can try benadryl at night for itching           Standley Brooking. Bader Stubblefield M.D.

## 2016-09-19 NOTE — Patient Instructions (Addendum)
  I am going to put a referral in to dermatology because of the severity of your problem you could have hand eczema and contact dermatitis or other things contributing to your problem. We'll treat with antibiotic for bacterial infection on top of the inflammation You can use this new ointment with cotton gloves on at night for 2 weeks. That can be equivalent to oral cortisone. Schedule for a checkup with full set of labs  At the visit .  In the next 2 months   We can try benadryl at night for itching

## 2016-11-25 ENCOUNTER — Encounter: Payer: Self-pay | Admitting: Internal Medicine

## 2017-01-02 ENCOUNTER — Encounter (HOSPITAL_COMMUNITY): Payer: Self-pay

## 2017-03-13 ENCOUNTER — Ambulatory Visit: Payer: BC Managed Care – PPO | Admitting: Registered"

## 2017-08-15 IMAGING — CT CT ABD-PELV W/ CM
2 of 4 series · 16 of 46 positions shown, 18 images · IV contrast (ISOVUE)
Comparison: None.

CLINICAL DATA: Epigastric pain.  History of bariatric surgery.

EXAM:
CT ABDOMEN AND PELVIS WITH CONTRAST
TECHNIQUE: Multidetector CT imaging of the abdomen and pelvis was performed
using the standard protocol following bolus administration of
intravenous contrast.
CONTRAST:  100mL OFQZ8H-G55 IOPAMIDOL (OFQZ8H-G55) INJECTION 61%

[Series 2: abd/pel with · axial · 0.70mm/px · z∈[-442,-47]mm · 13 of 87 slices shown, 15 images]
[im 4/87  soft-tissue]
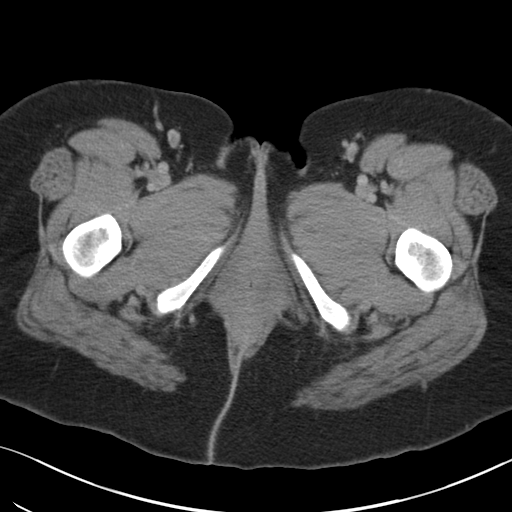
[im 4/87  bone]
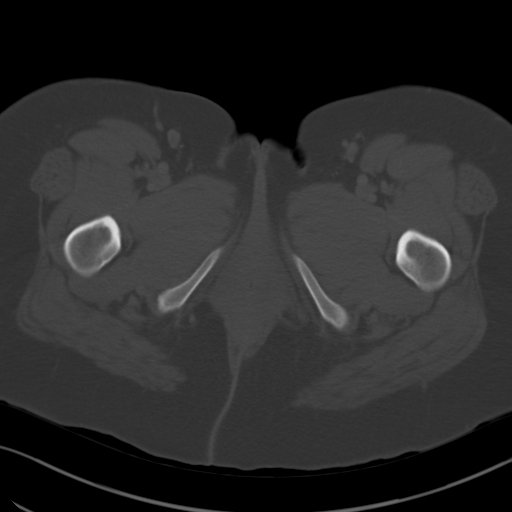
[im 12/87  soft-tissue]
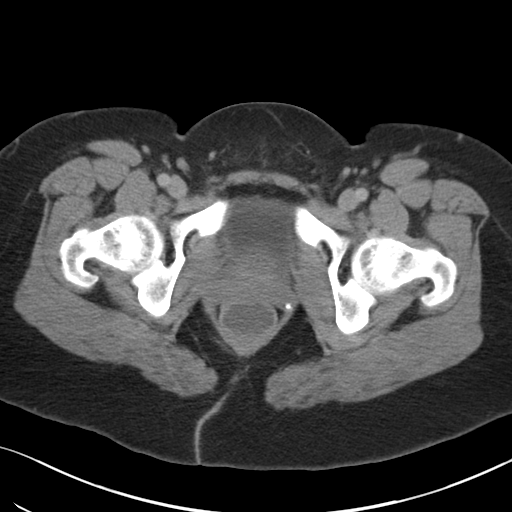
[im 20/87  soft-tissue]
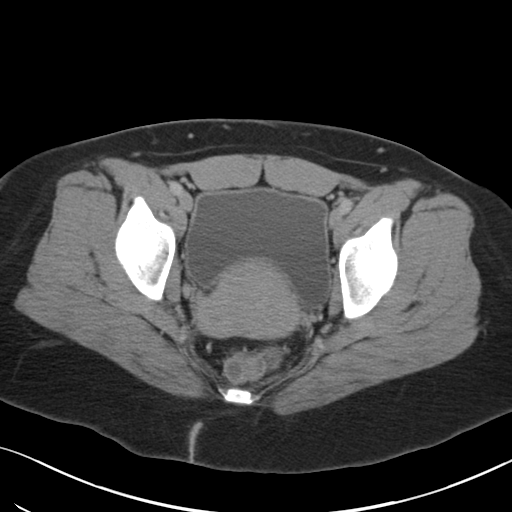
[im 24/87  soft-tissue]
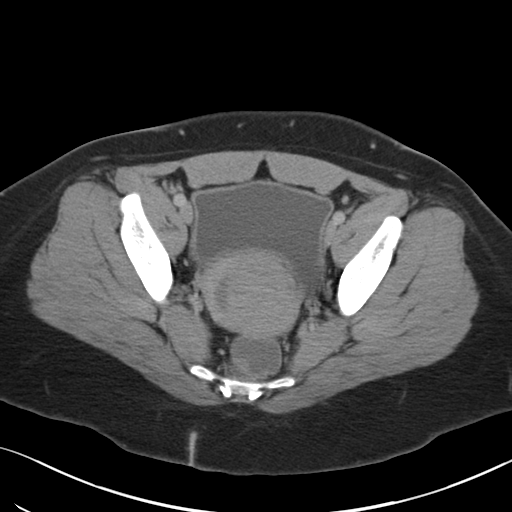
[im 32/87  soft-tissue]
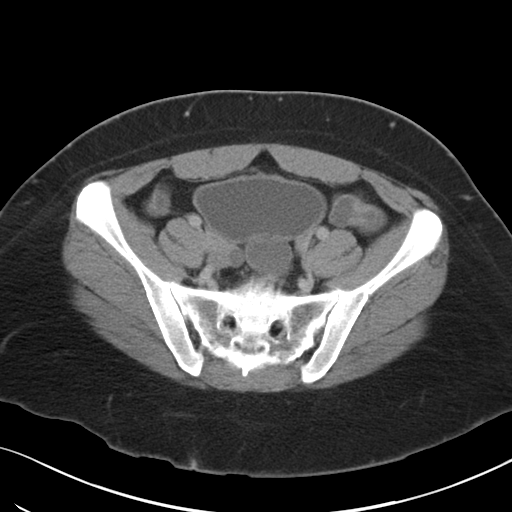
[im 36/87  soft-tissue]
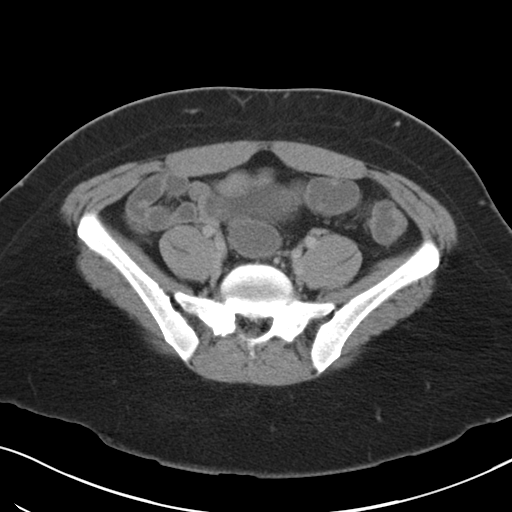
[im 44/87  soft-tissue]
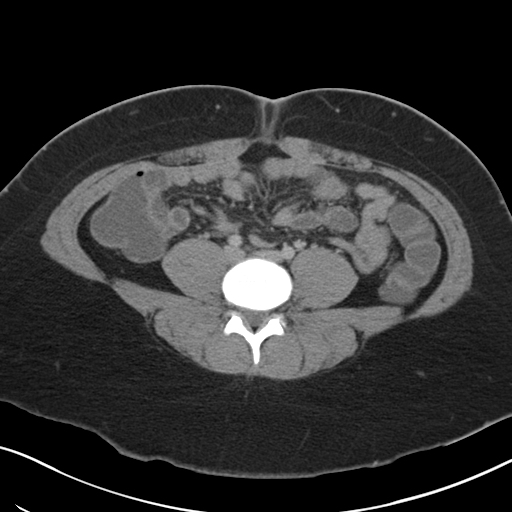
[im 51/87  soft-tissue]
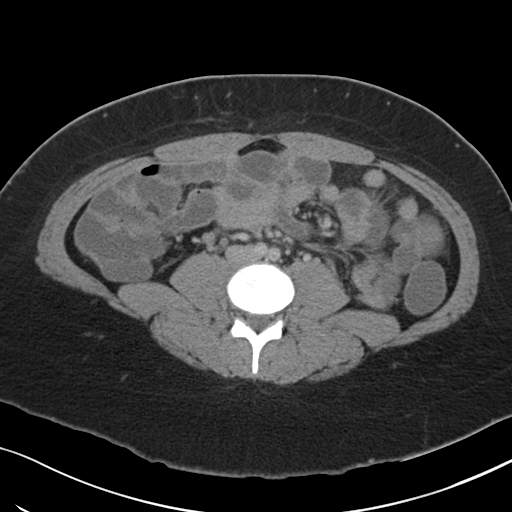
[im 55/87  soft-tissue]
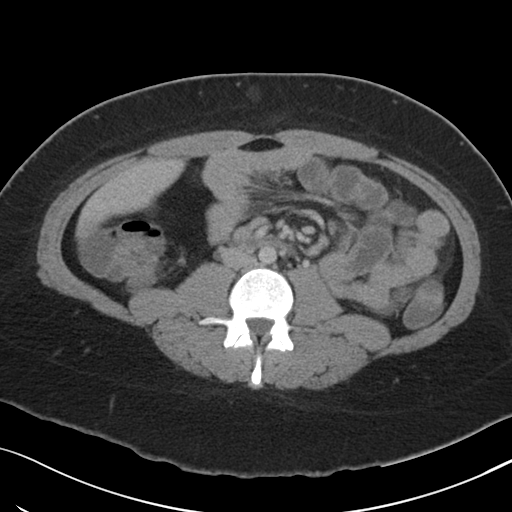
[im 55/87  bone]
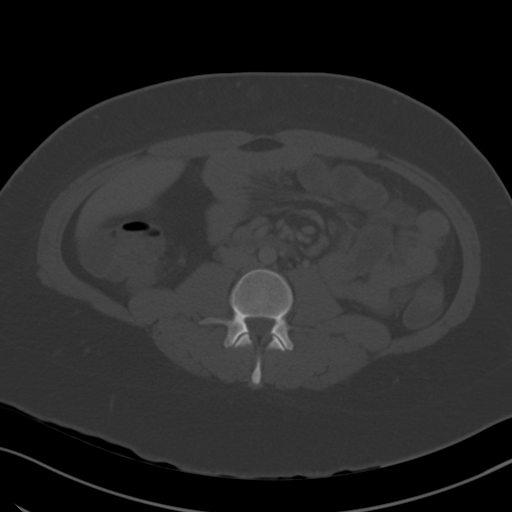
[im 63/87  soft-tissue]
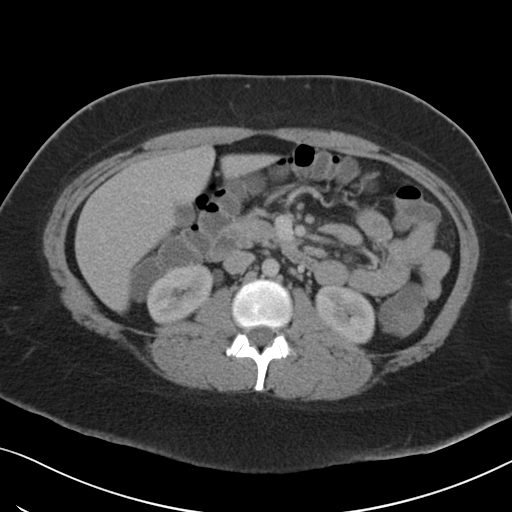
[im 67/87  soft-tissue]
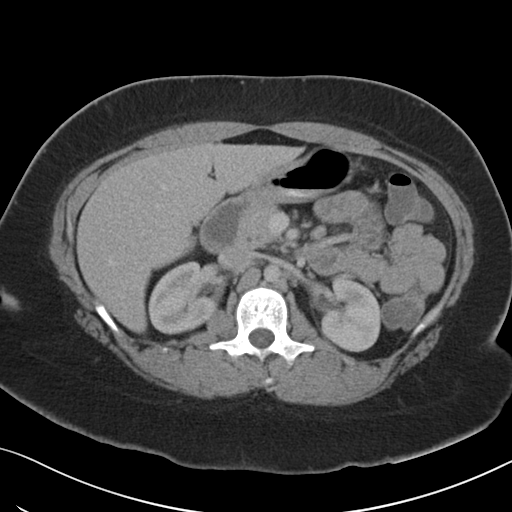
[im 75/87  soft-tissue]
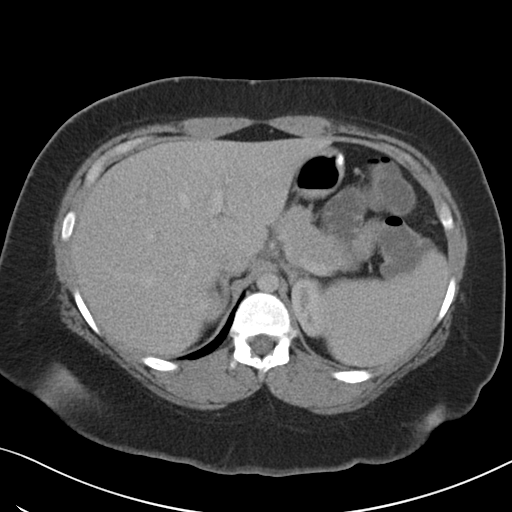
[im 83/87  soft-tissue]
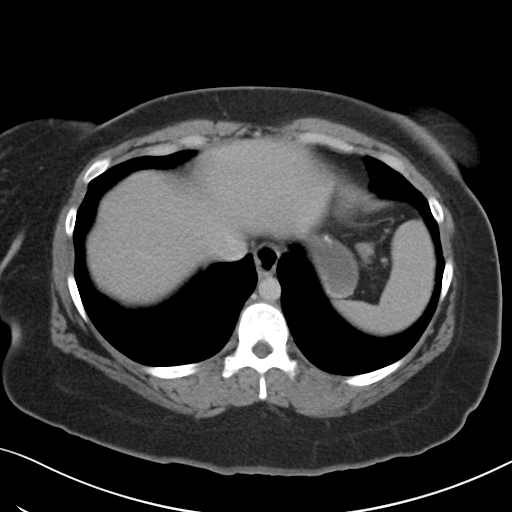

[Series 4: coronal a/|p · coronal · 0.72mm/px · 3 of 123 slices shown]
[im 41/123  soft-tissue]
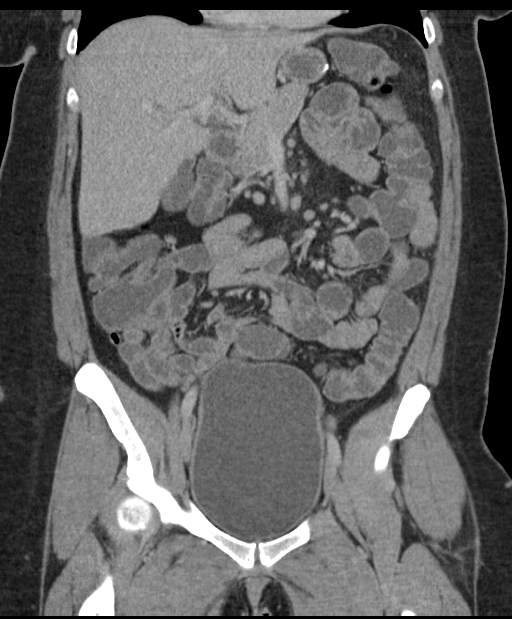
[im 55/123  soft-tissue]
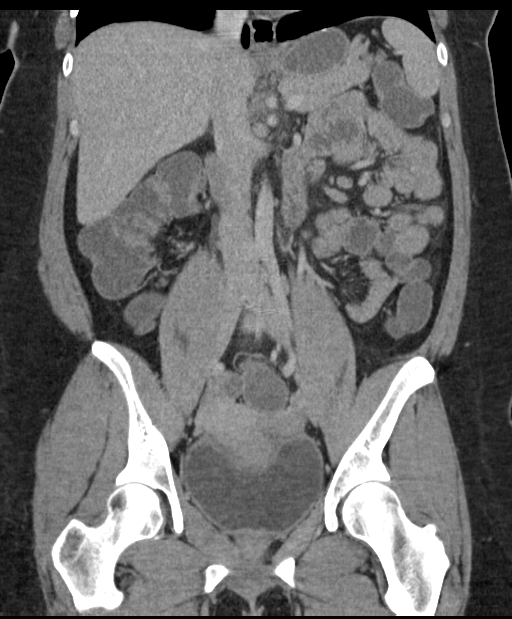
[im 68/123  soft-tissue]
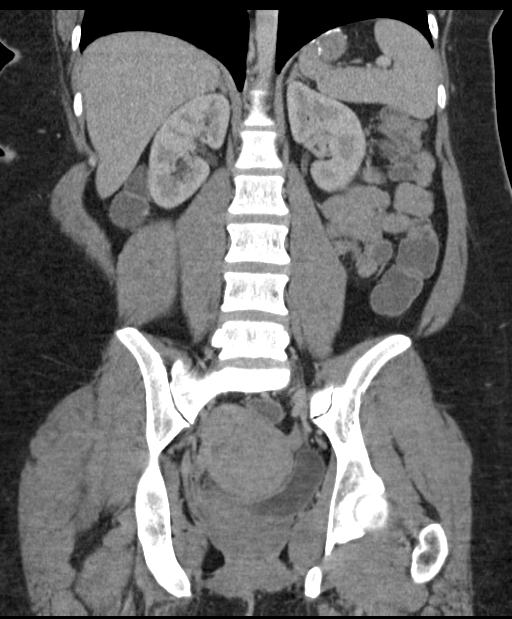

[16 of 46 positions shown; findings below may reference images not displayed]

FINDINGS: Lower chest: No pulmonary nodules. No visible pleural or pericardial
effusion.

Hepatobiliary: Normal hepatic size and contours without focal liver
lesion. No perihepatic ascites. No intra- or extrahepatic biliary
dilatation. Normal gallbladder.

Pancreas: Normal pancreatic contours and enhancement. No
peripancreatic fluid collection or pancreatic ductal dilatation.

Spleen: Normal.

Adrenals/Urinary Tract: Normal adrenal glands. No hydronephrosis or
solid renal mass.

Stomach/Bowel: Findings of gastric bypass are seen. The appendix is
normal. There is fluid throughout the colon and small bowel. No
evidence of acute inflammation. No evidence of obstruction. No
intra-abdominal fluid collection.

Vascular/Lymphatic: Normal course and caliber of the major abdominal
vessels. Numerous small, less than 1 hour area and retroperitoneal
contents.

Reproductive: There is a hypodense mass of the right lower uterine
segment and a heterogeneous left uterine mass that measures up to 6
x 4 cm.

Musculoskeletal: No lytic or blastic osseous lesion. Normal
visualized extrathoracic and extraperitoneal soft tissues.

Other: No contributory non-categorized findings.
IMPRESSION: Diffusely fluid-filled small bowel and colon, suggesting acute
diarrheal illness. Numerous subcentimeter retroperitoneal and
mesenteric lymph nodes are likely reactive.

## 2017-09-26 ENCOUNTER — Ambulatory Visit: Payer: BC Managed Care – PPO | Admitting: Internal Medicine

## 2017-09-26 ENCOUNTER — Encounter: Payer: Self-pay | Admitting: Internal Medicine

## 2017-09-26 VITALS — BP 124/76 | HR 106 | Temp 98.4°F | Ht 61.75 in | Wt 228.9 lb

## 2017-09-26 DIAGNOSIS — E232 Diabetes insipidus: Secondary | ICD-10-CM

## 2017-09-26 DIAGNOSIS — Z6841 Body Mass Index (BMI) 40.0 and over, adult: Secondary | ICD-10-CM

## 2017-09-26 DIAGNOSIS — Z9884 Bariatric surgery status: Secondary | ICD-10-CM | POA: Diagnosis not present

## 2017-09-26 DIAGNOSIS — G4733 Obstructive sleep apnea (adult) (pediatric): Secondary | ICD-10-CM

## 2017-09-26 DIAGNOSIS — L309 Dermatitis, unspecified: Secondary | ICD-10-CM

## 2017-09-26 DIAGNOSIS — R5383 Other fatigue: Secondary | ICD-10-CM | POA: Diagnosis not present

## 2017-09-26 DIAGNOSIS — N924 Excessive bleeding in the premenopausal period: Secondary | ICD-10-CM

## 2017-09-26 MED ORDER — HYDROCORTISONE 2.5 % EX OINT: TOPICAL_OINTMENT | Freq: Two times a day (BID) | CUTANEOUS | 0 refills | Status: DC

## 2017-09-26 NOTE — Progress Notes (Signed)
Chief Complaint  Patient presents with  . Follow-up    Pt c/o excessive fatigue. pt states that she uses her CPAP machine. Requesting labs to check all levels    HPI: Patient  Tiffany Roth  27 y.o. comes in today for fu and excessive fatigue  concerns of  Fatigue   Skin and hard to lose weight.  has LIPOMA; Hypothyroidism; OBESITY; VARICOSE VEIN; ECZEMA; Prematurity; Prematurity; Weight gain; Stress at work; BMI 40.0-44.9, adult ([place]); OSA (obstructive sleep apnea); Infectious eczematoid dermatitis; Eczema; Morbid obesity with BMI of 50.0-59.9, adult ([place]); Prediabetes; S/P laparoscopic sleeve gastrectomy; Diabetes insipidus ([place]); and Bariatric surgery status on their problem list.  Periods   3 x per month . 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 PAP SMEAR  09/03/2016  . TETANUS/TDAP  10/02/2016  . INFLUENZA VACCINE  10/05/2017  . HIV Screening  Completed    ROS:  GEN/ HEENT: No fever, significant weight changes sweats headaches vision problems hearing changes, CV/ PULM; No chest pain shortness of breath cough, syncope,edema  change in exercise tolerance. GI /GU: No adominal pain, vomiting, change in bowel habits. No blood in the stool. No significant GU symptoms. SKIN/HEME: ,no acute skin rashes suspicious lesions or bleeding. No lymphadenopathy, nodules, masses.  NEURO/ PSYCH:  No neurologic signs such as weakness  numbness. No depression anxiety. IMM/ Allergy: No unusual infections.  Allergy .  Skin  Still problematic   REST of 12 system review negative except as per HPI   Past Medical History:  Diagnosis Date  . Eczema   . 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 Prematurity    "3 months" 1# 15 oz" ventilator   . Sinus infection    recently diagnosed currently on antibiotic will have completed dosage prior to surgery   . Sleep apnea    uses CPAP   . Varicose veins    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    Past Surgical History:  Procedure Laterality Date  . LAPAROSCOPIC GASTRIC SLEEVE RESECTION N/A 06/02/2014   Procedure: LAPAROSCOPIC GASTRIC SLEEVE RESECTION;  Surgeon: Greer Pickerel, MD;  Location: [facility];  Service: General;  Laterality: N/A;  . UPPER GI ENDOSCOPY N/A 06/02/2014   Procedure: UPPER GI ENDOSCOPY;  Surgeon: Greer Pickerel, MD;  Location: [facility];  Service: General;  Laterality: N/A;  . varicose veins      surgically repaired 2011    Family History  Problem Relation Age of Onset  . Thyroid disease Mother   . Hypertension  Mother     Social History   Socioeconomic History  . Marital status: Single    Spouse name: Not on file  . Number of children: Not on file  . Years of education: Not on file  . Highest education level: Not on file  Occupational History  . 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 Financial resource strain: Not on file  . Food insecurity:    Worry: Not on file    Inability: Not on file  . Transportation needs:    Medical: Not on file    Non-medical: Not on file  Tobacco Use  . Smoking status: Never Smoker  . Smokeless tobacco: Never Used  Substance and Sexual Activity  . Alcohol use: No  . Drug use: No  . Sexual  activity: Not on file  Lifestyle  . Physical activity:    Days per week: Not on file    Minutes per session: Not on file  . Stress: Not on file  Relationships  . Social connections:    Talks on phone: Not on file    Gets together: Not on file    Attends religious service: Not on file    Active member of club or organization: Not on file    Attends meetings of clubs or organizations: Not on file    Relationship status: Not on file  Other Topics Concern  . 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    Doing much better less stress   Going to grad school in august .  [place] in [employer] education .     Outpatient Medications Prior to Visit  Medication Sig Dispense Refill  . clobetasol ointment (TEMOVATE) 4.62 % Apply 1 application topically 2 (two) times daily. To hands  For  2 weeks and then  As directed 30 g 1  . desmopressin (DDAVP) 0.01 % SOLN Place 1 spray into the nose 2 (two) times daily. 1 Bottle 1  . fluocinonide-emollient (LIDEX-E) 0.05 % cream Apply 1 application topically 2 (two) times daily. 30 g 2  . hydrocortisone cream 0.5 % Apply 1 application topically 2 (two) times daily. 30 g 0  . hydrOXYzine (ATARAX/VISTARIL) 25 MG tablet Take 1 tablet (25 mg total) by mouth every 8 (eight) hours as needed for itching. 15 tablet 0  . mometasone (ELOCON) 0.1 % ointment Apply topically daily. 45 g 2  . cyclobenzaprine (FLEXERIL) 5 MG tablet Take 1 tablet daily for headache. (Patient not taking: Reported on 09/26/2017) 20 tablet 0   No facility-administered medications prior to visit.      EXAM:  BP 124/76 (BP Location: Right Arm, Patient Position: Sitting, Cuff Size: Large)   Pulse (!) 106   Temp 98.4 F (36.9 C) (Oral)   Ht 5' 1.75" (1.568 m)   Wt 228 lb 14.4 oz (103.8 kg)   BMI 42.21 kg/m   Body mass  index is 42.21 kg/m. Wt Readings from Last 3 Encounters:  09/26/17 228 lb 14.4 oz (103.8 kg)  09/19/16 228 lb (103.4 kg)  08/11/16 221 lb 9.6 oz (100.5 kg)    Physical Exam: Vital signs reviewed [id number] is a well-developed well-nourished alert cooperative    who appearsr stated age in no acute distress.   Hypo  pigmented scaly irritated area around lips   HEENT: normocephalic atraumatic , Eyes: PERRL EOM's full, conjunctiva clear, Nares: paten,t no deformity discharge or tenderness., Ears: no deformity EAC's clear TMs with normal landmarks min wax on left . Mouth: clear OP, no lesions, edema tonsil 1+ .  Moist mucous membranes. Dentition in adequate repair. NECK: supple without masses, thyromegaly or bruits. CHEST/PULM:  Clear to auscultation and percussion breath sounds equal no wheeze , rales or rhonchi.   CV: PMI is nondisplaced, S1 S2 no gallops, murmurs, rubs. Peripheral pulses are full without delay.  ABDOMEN: Bowel sounds normal nontender  No guard or rebound, no hepato splenomegal no CVA tenderness.   Extremtities:  No clubbing cyanosis or edema,  VV  LE   no acute joint swelling or redness no focal atrophy NEURO:  Oriented x3, cranial nerves 3-12 appear to be intact, no obvious focal weakness,gait within normal l SKIN: inflammatory changes   eczematoid hands and arms and pigment changes , no bruising or petechiae. PSYCH: Oriented, good eye contact, no obvious depression anxiety, cognition and judgment appear normal. LN: no cervical  adenopathy  Lab Results  Component Value Date   WBC 10.3 02/04/2016   HGB 14.5 02/04/2016   HCT 43.5 02/04/2016   PLT 284 02/04/2016   GLUCOSE 114 (H) 02/04/2016   CHOL 149 03/13/2014   TRIG 138 03/13/2014   HDL 35 (L) 03/13/2014   LDLCALC 86 03/13/2014   ALT 19 02/04/2016   AST 27 02/04/2016   NA 143 02/04/2016   K 3.9 02/04/2016   CL 112 (H) 02/04/2016   CREATININE 1.33 (H) 02/04/2016   BUN 15 02/04/2016   CO2 22 02/04/2016   TSH 1.83  01/21/2016    BP Readings from Last 3 Encounters:  09/26/17 124/76  09/19/16 120/60  08/11/16 110/72    Wt Readings from Last 3 Encounters:  09/26/17 228 lb 14.4 oz (103.8 kg)  09/19/16 228 lb (103.4 kg)  08/11/16 221 lb 9.6 oz (100.5 kg)     ASSESSMENT AND PLAN:  Discussed the following assessment and plan:  Other fatigue - Plan: TSH, T4, free, T3, free, Lipid panel, Basic metabolic panel, CBC with Differential/Platelet, Hemoglobin A1c, Hepatic function panel, Iron, TIBC and Ferritin Panel, Vitamin B12, C-reactive protein  S/P laparoscopic sleeve gastrectomy - Plan: TSH, T4, free, T3, free, Lipid panel, Basic metabolic panel, CBC with Differential/Platelet, Hemoglobin A1c, Hepatic function panel, Iron, TIBC and Ferritin Panel, Vitamin B12, C-reactive protein  Diabetes insipidus ([facility]) - Plan: TSH, T4, free, T3, free, Lipid panel, Basic metabolic panel, CBC with Differential/Platelet, Hemoglobin A1c, Hepatic function panel, Iron, TIBC and Ferritin Panel, Vitamin B12, C-reactive protein  Bariatric surgery status - Plan: TSH, T4, free, T3, free, Lipid panel, Basic metabolic panel, CBC with Differential/Platelet, Hemoglobin A1c, Hepatic function panel, Iron, TIBC and Ferritin Panel, Vitamin B12, C-reactive protein  OSA (obstructive sleep apnea) - Plan: TSH, T4, free, T3, free, Lipid panel, Basic metabolic panel, CBC with Differential/Platelet, Hemoglobin A1c, Hepatic function panel, Iron, TIBC and Ferritin Panel, Vitamin B12, C-reactive protein  Excessive bleeding in premenopausal period - Plan: TSH, T4, free, T3, free, Lipid panel, Basic metabolic panel, CBC with Differential/Platelet, Hemoglobin A1c, Hepatic function panel, Iron, TIBC and Ferritin Panel, Vitamin B12, C-reactive protein  Eczema, unspecified type  BMI 40.0-44.9, adult ([facility]) Caution with use but given 2.5% hydrocortisone ointment for the lips to use sparingly around the area.  Consider seeing other dermatologist has  good questions document my Dr. Leonie Green contact us if  needs a referral. Regard to fatigue weight loss consider weight watchers checking metabolic agree with seeing GYN for her free follow-up in 3 to 4 months depending on how she is doing and her lab results. 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 Patient Care Team: Panosh, Standley Brooking, MD as PCP - Chauncy Passy, MD as Consulting Physician (General Surgery) Corliss Parish, MD as Consulting Physician (Nephrology) Patient Instructions    Dermatology :     Oliver Hum  Trial of   Low dose cortisone  2.5 % ointment for  No more than 2 weeks    Checking   Blood tests for thyroid anemia  Kidneys etc .   Make sure   You think your  Sleep apnea    Is well treated .  See the gyne for the heavy bleeding .   Try weight watchers   For tracking and ideas   And more success .   Plan follow  As indicated       Wanda K. Panosh M.D.

## 2017-09-26 NOTE — Patient Instructions (Addendum)
   Dermatology :     Tiffany Roth  Trial of   Low dose cortisone  2.5 % ointment for  No more than 2 weeks    Checking   Blood tests for thyroid anemia  Kidneys etc .   Make sure   You think your  Sleep apnea    Is well treated .  See the gyne for the heavy bleeding .   Try weight watchers   For tracking and ideas   And more success .   Plan follow  As indicated

## 2017-09-27 LAB — HEPATIC FUNCTION PANEL
ALK PHOS: 108 U/L (ref 39–117)
ALT: 22 U/L (ref 0–35)
AST: 27 U/L (ref 0–37)
Albumin: 3.6 g/dL (ref 3.5–5.2)
BILIRUBIN DIRECT: 0.1 mg/dL (ref 0.0–0.3)
BILIRUBIN TOTAL: 0.5 mg/dL (ref 0.2–1.2)
TOTAL PROTEIN: 6.4 g/dL (ref 6.0–8.3)

## 2017-09-27 LAB — TSH: TSH: 2.55 u[IU]/mL (ref 0.35–4.50)

## 2017-09-27 LAB — CBC WITH DIFFERENTIAL/PLATELET
BASOS ABS: 0.1 10*3/uL (ref 0.0–0.1)
Basophils Relative: 1.2 % (ref 0.0–3.0)
EOS ABS: 0.2 10*3/uL (ref 0.0–0.7)
Eosinophils Relative: 3 % (ref 0.0–5.0)
HCT: 36.2 % (ref 36.0–46.0)
Hemoglobin: 12 g/dL (ref 12.0–15.0)
LYMPHS ABS: 3.4 10*3/uL (ref 0.7–4.0)
Lymphocytes Relative: 44.3 % (ref 12.0–46.0)
MCHC: 33.2 g/dL (ref 30.0–36.0)
MCV: 88.7 fl (ref 78.0–100.0)
MONO ABS: 0.9 10*3/uL (ref 0.1–1.0)
MONOS PCT: 11.3 % (ref 3.0–12.0)
NEUTROS ABS: 3.1 10*3/uL (ref 1.4–7.7)
NEUTROS PCT: 40.2 % — AB (ref 43.0–77.0)
PLATELETS: 337 10*3/uL (ref 150.0–400.0)
RBC: 4.08 Mil/uL (ref 3.87–5.11)
RDW: 13.2 % (ref 11.5–15.5)
WBC: 7.6 10*3/uL (ref 4.0–10.5)

## 2017-09-27 LAB — T3, FREE: T3, Free: 3.5 pg/mL (ref 2.3–4.2)

## 2017-09-27 LAB — HEMOGLOBIN A1C: HEMOGLOBIN A1C: 5.7 % (ref 4.6–6.5)

## 2017-09-27 LAB — BASIC METABOLIC PANEL
BUN: 11 mg/dL (ref 6–23)
CALCIUM: 9.2 mg/dL (ref 8.4–10.5)
CO2: 27 meq/L (ref 19–32)
CREATININE: 1.13 mg/dL (ref 0.40–1.20)
Chloride: 105 mEq/L (ref 96–112)
GFR: 73.2 mL/min (ref >60.00)
GLUCOSE: 73 mg/dL (ref 70–99)
Potassium: 4.1 mEq/L (ref 3.5–5.1)
SODIUM: 140 meq/L (ref 135–145)

## 2017-09-27 LAB — LIPID PANEL
CHOLESTEROL: 142 mg/dL (ref 0–200)
HDL: 43.7 mg/dL (ref >39.00)
LDL CALC: 64 mg/dL (ref 0–99)
NonHDL: 98.3
Total CHOL/HDL Ratio: 3
Triglycerides: 172 mg/dL — ABNORMAL HIGH (ref 0.0–149.0)
VLDL: 34.4 mg/dL (ref 0.0–40.0)

## 2017-09-27 LAB — T4, FREE: Free T4: 0.98 ng/dL (ref 0.60–1.60)

## 2017-09-27 LAB — IRON,TIBC AND FERRITIN PANEL
%SAT: 4 % — AB (ref 16–45)
FERRITIN: 5 ng/mL — AB (ref 16–154)
Iron: 17 ug/dL — ABNORMAL LOW (ref 40–190)
TIBC: 393 ug/dL (ref 250–450)

## 2017-09-27 LAB — VITAMIN B12: VITAMIN B 12: 624 pg/mL (ref 211–911)

## 2017-09-27 LAB — C-REACTIVE PROTEIN: CRP: 0.1 mg/dL — AB (ref 0.5–20.0)

## 2017-10-02 ENCOUNTER — Other Ambulatory Visit: Payer: Self-pay | Admitting: Internal Medicine

## 2017-10-02 DIAGNOSIS — E611 Iron deficiency: Secondary | ICD-10-CM

## 2018-03-05 ENCOUNTER — Ambulatory Visit: Payer: BC Managed Care – PPO | Admitting: Internal Medicine

## 2018-03-05 ENCOUNTER — Encounter: Payer: Self-pay | Admitting: Internal Medicine

## 2018-03-05 VITALS — BP 122/84 | Temp 98.2°F | Wt 226.2 lb

## 2018-03-05 DIAGNOSIS — R519 Headache, unspecified: Secondary | ICD-10-CM

## 2018-03-05 DIAGNOSIS — E232 Diabetes insipidus: Secondary | ICD-10-CM | POA: Diagnosis not present

## 2018-03-05 DIAGNOSIS — Z9884 Bariatric surgery status: Secondary | ICD-10-CM

## 2018-03-05 DIAGNOSIS — R51 Headache: Secondary | ICD-10-CM

## 2018-03-05 NOTE — Patient Instructions (Addendum)
This could be  Post  Traumatic  Headache  As we discussed . Also begin Flonase  Every day  In case sinsu is adding to this.   If    persistent or progressive over the nedt 2-3 weeks  We can get  Neurology to see you and or get a ct scan but I dont see any alarming features on your exam today .  s Stay.  Hydrated .  Track the tylenol .      Recurrent Migraine Headache  Migraines are a type of headache, and they are usually stronger and more sudden than normal headaches (tension headaches). Migraines are characterized by an intense pulsing, throbbing pain that is usually only present on one side of the head. Sometimes, migraine headaches can cause nausea, vomiting, sensitivity to light and sound, and vision changes. Recurrent migraines keep coming back (recurring). A migraine can last from 4 hours up to 3 days. What are the causes? The exact cause of this condition is not known. However, a migraine may be caused when nerves in the brain become irritated and release chemicals that cause inflammation of blood vessels. This inflammation causes pain. Certain things may also trigger migraines, such as:  A disruption in your regular eating and sleeping schedule.  Smoking.  Stress.  Menstruation.  Certain foods and drinks, such as: ? Aged cheese. ? Chocolate. ? Alcohol. ? Caffeine. ? Foods or drinks that contain nitrates, glutamate, aspartame, MSG, or tyramine.  Lack of sleep.  Hunger.  Physical exertion.  Fatigue.  High altitude.  Weather changes.  Medicines, such as: ? Nitroglycerin, which is used to treat chest pain. ? Birth control pills. ? Estrogen. ? Some blood pressure medicines. What are the signs or symptoms? Symptoms of this condition vary for each person and may include:  Pain that is usually only present on one side of the head. In some cases, the pain may be on both sides of the head or around the head or neck.  Pulsating or throbbing pain.  Severe pain  that prevents daily activities.  Pain that is aggravated by any physical activity.  Nausea, vomiting, or both.  Dizziness.  Pain with exposure to bright lights, loud noises, or activity.  General sensitivity to bright lights, loud noises, or smells. Before you get a migraine, you may get warning signs that a migraine is coming (aura). An aura may include:  Seeing flashing lights.  Seeing bright spots, halos, or zigzag lines.  Having tunnel vision or blurred vision.  Having numbness or a tingling feeling.  Having trouble talking.  Having muscle weakness.  Smelling a certain odor. How is this diagnosed? This condition is often diagnosed based on:  Your symptoms and medical history.  A physical exam. You may also have tests, including:  A CT scan or MRI of your brain. These imaging tests cannot diagnose migraines, but they can help to rule out other causes of headaches.  Blood tests. How is this treated? This condition is treated with:  Medicines. These are used for: ? Lessening pain and nausea. ? Preventing recurrent migraines.  Lifestyle changes, such as changes to your diet or sleeping patterns.  Behavior therapy, such as relaxation training or biofeedback. Biofeedback is a treatment that involves teaching you to relax and use your brain to lower your heart rate and control your breathing. Follow these instructions at home: Medicines  Take over-the-counter and prescription medicines only as told by your health care provider.  Do not drive or use  heavy machinery while taking prescription pain medicine. Lifestyle  Do not use any products that contain nicotine or tobacco, such as cigarettes and e-cigarettes. If you need help quitting, ask your health care provider.  Limit alcohol intake to no more than 1 drink a day for nonpregnant women and 2 drinks a day for men. One drink equals 12 oz of beer, 5 oz of wine, or 1 oz of hard liquor.  Get 7-9 hours of sleep  each night, or the amount of sleep recommended by your health care provider.  Limit your stress. Talk with your health care provider if you need help with stress management.  Maintain a healthy weight. If you need help losing weight, ask your health care provider.  Exercise regularly. Aim for 150 minutes of moderate-intensity exercise (walking, biking, yoga) or 75 minutes of vigorous exercise (running, circuit training, swimming) each week. General instructions   Keep a journal to find out what triggers your migraine headaches so you can avoid these triggers. For example, write down: ? What you eat and drink. ? How much sleep you get. ? Any change to your diet or medicines.  Lie down in a dark, quiet room when you have a migraine.  Try placing a cool towel over your head when you have a migraine.  Keep lights dim, if bright lights bother you and make your migraines worse.  Keep all follow-up visits as told by your health care provider. This is important. Contact a health care provider if:  Your pain does not improve, even with medicine.  Your migraines continue to return, even with medicine.  You have a fever.  You have weight loss. Get help right away if:  Your migraine becomes severe and medicine does not help.  You have a stiff neck.  You have a loss of vision.  You have muscle weakness or loss of muscle control.  You start losing your balance or have trouble walking.  You feel faint or you pass out.  You develop new, severe symptoms.  You start having abrupt severe headaches that last for a second or less, like a thunderclap. Summary  Migraine headaches are usually stronger and more sudden than normal headaches (tension headaches). Migraines are characterized by an intense pulsing, throbbing pain that is usually only present on one side of the head.  The exact cause of this condition is not known. However, a migraine may be caused when nerves in the brain  become irritated and release chemicals that cause inflammation of blood vessels.  Certain things may trigger migraines, such as changes to diet or sleeping patterns, smoking, certain foods, alcohol, stress, and certain medicines.  Sometimes, migraine headaches can cause nausea, vomiting, sensitivity to light and sound, and vision changes.  Migraines are often diagnosed based on your symptoms, medical history, and a physical exam. This information is not intended to replace advice given to you by your health care provider. Make sure you discuss any questions you have with your health care provider. Document Released: 11/16/2000 Document Revised: 12/04/2015 Document Reviewed: 12/04/2015 Elsevier Interactive Patient Education  2019 [employer].

## 2018-03-05 NOTE — Progress Notes (Signed)
Chief Complaint  Patient presents with  . Headache    x2 weeks. tried tynenol extra strength does not make it go away but helps. On right temple feels like " punching" in her head.     HPI: Tiffany Roth 27 y.o. come in for sda   For above  recently eating meat .   Began protein .  But no other diet changes  Noted off and on but ongoing HA for past 2 weeks   Right  Side tem and above and  Below eye sius area for 2 weeks  feeling punching . Pounding .  Comes and goes.  Worse after exercise when goes to gym   And not at sleep .  But there Tylenol extra strength  Some help not supposed to take  nsaids ? No fever  serios   Congestion .  But does have cough and some drainage   See above   Hx of same   Ha  From strep.  In past   No vomiting eye sx   Did hit head at gyme when leaning over to get bell  No loc and no stars  But  Began soon after that . No tmj  Hx or dental sx  No fever  ROS: See pertinent positives and negatives per HPI. No neuro sx otherwise  No recnet fu bariatric surgery but watches diet   Got back from trip to [place]  End November   Past Medical History:  Diagnosis Date  . Eczema   . 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 Prematurity    "3 months" 1# 15 oz" ventilator   . Sinus infection    recently diagnosed currently on antibiotic will have completed dosage prior to surgery   . Sleep apnea    uses CPAP   . 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 Thyroid disease Mother   . Hypertension Mother     Social History   Socioeconomic History  . Marital status: Single    Spouse name: Not on file  . Number of children: Not on file  . Years of education: Not on file  . Highest education  level: Not on file  Occupational History  . 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 Financial resource strain: Not on file  . Food insecurity:    Worry: Not on file    Inability: Not on file  . Transportation needs:    Medical: Not on file    Non-medical: Not on file  Tobacco Use  . Smoking status: Never Smoker  . Smokeless tobacco: Never Used  Substance and Sexual Activity  . Alcohol use: No  . Drug use: No  . Sexual activity: Not on file  Lifestyle  . Physical activity:    Days per week: Not on file    Minutes per session: Not on file  . Stress: Not on file  Relationships  . Social connections:    Talks on phone: Not on file    Gets together: Not on file    Attends religious service: Not on file    Active member of  club or organization: Not on file    Attends meetings of clubs or organizations: Not on file    Relationship status: Not on file  Other Topics Concern  . 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    Doing much better less stress   Going to grad school in august .  [place] in [employer] education .     Outpatient Medications Prior to Visit  Medication Sig Dispense Refill  . clobetasol ointment (TEMOVATE) 9.79 % Apply 1 application topically 2 (two) times daily. To hands  For  2 weeks and then  As directed 30 g 1  . desmopressin (DDAVP) 0.01 % SOLN Place 1 spray into the nose 2 (two) times daily. 1 Bottle 1  . fluocinonide-emollient (LIDEX-E) 0.05 % cream Apply 1 application topically 2 (two) times daily. 30 g 2  . hydrocortisone 2.5 % ointment Apply topically 2 (two) times daily. Small amount  To face no more than 2 weeks 20 g 0  . hydrocortisone cream 0.5 % Apply 1 application topically 2 (two) times daily. 30 g 0  . hydrOXYzine (ATARAX/VISTARIL) 25 MG tablet Take  1 tablet (25 mg total) by mouth every 8 (eight) hours as needed for itching. 15 tablet 0  . mometasone (ELOCON) 0.1 % ointment Apply topically daily. 45 g 2   No facility-administered medications prior to visit.      EXAM:  BP 122/84 (BP Location: Left Arm, Patient Position: Sitting, Cuff Size: Large)   Temp 98.2 F (36.8 C) (Oral)   Wt 226 lb 3.2 oz (102.6 kg)   BMI 41.71 kg/m   Body mass index is 41.71 kg/m.  GENERAL: vitals reviewed and listed above, alert, oriented, appears well hydrated and in no acute distress looks mildy allergic  Skin clear  Non focal  HEENT: atraumatic,? Tender at tmj area  conjunctiva  clear, no obvious abnormalities on inspection of external nose and earstm clear  OP : no lesion edema or exudate mild red  Mild congestion no frontal tenderness  NECK: no obvious masses on inspection palpation  LUNGS: clear to auscultation bilaterally, no wheezes, rales or rhonchi, good air movement CV: HRRR, no clubbing cyanosis or  peripheral edema nl cap refill  MS: moves all extremities wpneur NEURO: oriented x 3 CN 3-12 appear intact. No focal muscle weakness or atrophy. DTRs symmetrical. Gait WNL.  Grossly non focal. No tremor or abnormal movement.   PSYCH: pleasant and cooperative, no obvious depression or anxiety Lab Results  Component Value Date   WBC 7.6 09/26/2017   HGB 12.0 09/26/2017   HCT 36.2 09/26/2017   PLT 337.0 09/26/2017   GLUCOSE 73 09/26/2017   CHOL 142 09/26/2017   TRIG 172.0 (H) 09/26/2017   HDL 43.70 09/26/2017   LDLCALC 64 09/26/2017   ALT 22 09/26/2017   AST 27 09/26/2017   NA 140 09/26/2017   K 4.1 09/26/2017   CL 105 09/26/2017   CREATININE 1.13 09/26/2017   BUN 11 09/26/2017   CO2 27 09/26/2017   TSH 2.55 09/26/2017   HGBA1C 5.7 09/26/2017   BP Readings from Last 3 Encounters:  03/05/18 122/84  09/26/17 124/76  09/19/16 120/60   Wt Readings from Last 3 Encounters:  03/05/18 226 lb 3.2 oz (102.6 kg)  09/26/17 228  lb 14.4  oz (103.8 kg)  09/19/16 228 lb (103.4 kg)    ASSESSMENT AND PLAN:  Discussed the following assessment and plan:  Nonintractable headache, unspecified chronicity pattern, unspecified headache type  Bariatric surgery status  Diabetes insipidus ([place])  Pt asks  about head scans at this time  Would rx for sinus congestion and time for poss post traumatic ha based on hx but if    persistent or progressive then plan fu eval  consdier  Sinus ct and or neuro fu  But no alarm features on  Exam today   -Patient advised to return or notify health care team  if  new concerns arise.  Patient Instructions  This could be  Post  Traumatic  Headache  As we discussed . Also begin Flonase  Every day  In case sinsu is adding to this.   If    persistent or progressive over the nedt 2-3 weeks  We can get  Neurology to see you and or get a ct scan but I dont see any alarming features on your exam today .  s Stay.  Hydrated .  Track the tylenol .      Recurrent Migraine Headache  Migraines are a type of headache, and they are usually stronger and more sudden than normal headaches (tension headaches). Migraines are characterized by an intense pulsing, throbbing pain that is usually only present on one side of the head. Sometimes, migraine headaches can cause nausea, vomiting, sensitivity to light and sound, and vision changes. Recurrent migraines keep coming back (recurring). A migraine can last from 4 hours up to 3 days. What are the causes? The exact cause of this condition is not known. However, a migraine may be caused when nerves in the brain become irritated and release chemicals that cause inflammation of blood vessels. This inflammation causes pain. Certain things may also trigger migraines, such as:  A disruption in your regular eating and sleeping schedule.  Smoking.  Stress.  Menstruation.  Certain foods and drinks, such as: ? Aged cheese. ? Chocolate. ? Alcohol. ? Caffeine. ? Foods  or drinks that contain nitrates, glutamate, aspartame, MSG, or tyramine.  Lack of sleep.  Hunger.  Physical exertion.  Fatigue.  High altitude.  Weather changes.  Medicines, such as: ? Nitroglycerin, which is used to treat chest pain. ? Birth control pills. ? Estrogen. ? Some blood pressure medicines. What are the signs or symptoms? Symptoms of this condition vary for each person and may include:  Pain that is usually only present on one side of the head. In some cases, the pain may be on both sides of the head or around the head or neck.  Pulsating or throbbing pain.  Severe pain that prevents daily activities.  Pain that is aggravated by any physical activity.  Nausea, vomiting, or both.  Dizziness.  Pain with exposure to bright lights, loud noises, or activity.  General sensitivity to bright lights, loud noises, or smells. Before you get a migraine, you may get warning signs that a migraine is coming (aura). An aura may include:  Seeing flashing lights.  Seeing bright spots, halos, or zigzag lines.  Having tunnel vision or blurred vision.  Having numbness or a tingling feeling.  Having trouble talking.  Having muscle weakness.  Smelling a certain odor. How is this diagnosed? This condition is often diagnosed based on:  Your symptoms and medical history.  A physical exam. You may also have tests, including:  A CT scan  or MRI of your brain. These imaging tests cannot diagnose migraines, but they can help to rule out other causes of headaches.  Blood tests. How is this treated? This condition is treated with:  Medicines. These are used for: ? Lessening pain and nausea. ? Preventing recurrent migraines.  Lifestyle changes, such as changes to your diet or sleeping patterns.  Behavior therapy, such as relaxation training or biofeedback. Biofeedback is a treatment that involves teaching you to relax and use your brain to lower your heart rate and  control your breathing. Follow these instructions at home: Medicines  Take over-the-counter and prescription medicines only as told by your health care provider.  Do not drive or use heavy machinery while taking prescription pain medicine. Lifestyle  Do not use any products that contain nicotine or tobacco, such as cigarettes and e-cigarettes. If you need help quitting, ask your health care provider.  Limit alcohol intake to no more than 1 drink a day for nonpregnant women and 2 drinks a day for men. One drink equals 12 oz of beer, 5 oz of wine, or 1 oz of hard liquor.  Get 7-9 hours of sleep each night, or the amount of sleep recommended by your health care provider.  Limit your stress. Talk with your health care provider if you need help with stress management.  Maintain a healthy weight. If you need help losing weight, ask your health care provider.  Exercise regularly. Aim for 150 minutes of moderate-intensity exercise (walking, biking, yoga) or 75 minutes of vigorous exercise (running, circuit training, swimming) each week. General instructions   Keep a journal to find out what triggers your migraine headaches so you can avoid these triggers. For example, write down: ? What you eat and drink. ? How much sleep you get. ? Any change to your diet or medicines.  Lie down in a dark, quiet room when you have a migraine.  Try placing a cool towel over your head when you have a migraine.  Keep lights dim, if bright lights bother you and make your migraines worse.  Keep all follow-up visits as told by your health care provider. This is important. Contact a health care provider if:  Your pain does not improve, even with medicine.  Your migraines continue to return, even with medicine.  You have a fever.  You have weight loss. Get help right away if:  Your migraine becomes severe and medicine does not help.  You have a stiff neck.  You have a loss of vision.  You have  muscle weakness or loss of muscle control.  You start losing your balance or have trouble walking.  You feel faint or you pass out.  You develop new, severe symptoms.  You start having abrupt severe headaches that last for a second or less, like a thunderclap. Summary  Migraine headaches are usually stronger and more sudden than normal headaches (tension headaches). Migraines are characterized by an intense pulsing, throbbing pain that is usually only present on one side of the head.  The exact cause of this condition is not known. However, a migraine may be caused when nerves in the brain become irritated and release chemicals that cause inflammation of blood vessels.  Certain things may trigger migraines, such as changes to diet or sleeping patterns, smoking, certain foods, alcohol, stress, and certain medicines.  Sometimes, migraine headaches can cause nausea, vomiting, sensitivity to light and sound, and vision changes.  Migraines are often diagnosed based on your symptoms, medical  history, and a physical exam. This information is not intended to replace advice given to you by your health care provider. Make sure you discuss any questions you have with your health care provider. Document Released: 11/16/2000 Document Revised: 12/04/2015 Document Reviewed: 12/04/2015 Elsevier Interactive Patient Education  2019 [place]. Mikail Goostree M.D.

## 2018-08-27 ENCOUNTER — Other Ambulatory Visit: Payer: Self-pay | Admitting: *Deleted

## 2018-08-27 DIAGNOSIS — Z20822 Contact with and (suspected) exposure to covid-19: Secondary | ICD-10-CM

## 2018-09-02 LAB — NOVEL CORONAVIRUS, NAA: SARS-CoV-2, NAA: NOT DETECTED

## 2018-09-21 ENCOUNTER — Encounter: Payer: Self-pay | Admitting: Internal Medicine

## 2018-09-21 ENCOUNTER — Ambulatory Visit (INDEPENDENT_AMBULATORY_CARE_PROVIDER_SITE_OTHER): Payer: BC Managed Care – PPO | Admitting: Internal Medicine

## 2018-09-21 VITALS — Temp 98.2°F | Ht 61.75 in | Wt 237.0 lb

## 2018-09-21 DIAGNOSIS — Z6841 Body Mass Index (BMI) 40.0 and over, adult: Secondary | ICD-10-CM

## 2018-09-21 DIAGNOSIS — Z9884 Bariatric surgery status: Secondary | ICD-10-CM

## 2018-09-21 DIAGNOSIS — R070 Pain in throat: Secondary | ICD-10-CM | POA: Diagnosis not present

## 2018-09-21 DIAGNOSIS — Z0289 Encounter for other administrative examinations: Secondary | ICD-10-CM

## 2018-09-21 NOTE — Progress Notes (Signed)
Virtual Visit via Video Note  I connected with@ on 09/21/18 at  2:30 PM EDT by a video enabled telemedicine application and verified that I am speaking with the correct person using two identifiers. Location patient: home Location provider:work  office Persons participating in the virtual visit: patient, provider  [place] national recommendations  regarding COVID 19 pandemic   video visit is advised over in office visit for this patient.  Patient aware  of the limitations of evaluation and management by telemedicine and  availability of in person appointments. and agreed to proceed.   HPI: Tiffany Roth presents for video visit Has had a week of sensation of burning sore throat   Better in am without assoc fever dysphagia cough runny nose or allergy attack . Today after selzer water  Is better .  Seems to happen in afternoon pm.  Says if pushes behind ear may make flar  No gerd reported no vomiting or other illness feeling.  No exposures  No worried about covid   staying hydrated with her DI    Feels she needs to get weight down more than she is at after  bariatric surgery although has had a 40 + weight loss .  Going to get doctorate from [employer] in education.   ROS: See pertinent positives and negatives per HPI. No nvd.    Past Medical History:  Diagnosis Date  . Eczema   . 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 Prematurity    "3 months" 1# 15 oz" ventilator   . Sinus infection    recently diagnosed currently on antibiotic will have completed dosage prior to surgery   . Sleep apnea    uses CPAP   . Varicose veins    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    Past Surgical History:  Procedure Laterality Date  . LAPAROSCOPIC GASTRIC SLEEVE RESECTION N/A 06/02/2014    Procedure: LAPAROSCOPIC GASTRIC SLEEVE RESECTION;  Surgeon: Greer Pickerel, MD;  Location: [facility];  Service: General;  Laterality: N/A;  . UPPER GI ENDOSCOPY N/A 06/02/2014   Procedure: UPPER GI ENDOSCOPY;  Surgeon: Greer Pickerel, MD;  Location: [facility];  Service: General;  Laterality: N/A;  . varicose veins      surgically repaired 2011    Family History  Problem Relation Age of Onset  . Thyroid disease Mother   . Hypertension Mother     Social History   Tobacco Use  . Smoking status: Never Smoker  . Smokeless tobacco: Never Used  Substance Use Topics  . Alcohol use: No  . Drug use: No      Current Outpatient Medications:  .  clobetasol ointment (TEMOVATE) 5.97 %, Apply 1 application topically 2 (two) times daily. To hands  For  2 weeks and then  As directed, Disp: 30 g, Rfl: 1 .  desmopressin (DDAVP) 0.01 % SOLN, Place 1 spray into the nose 2 (two) times daily., Disp: 1 Bottle, Rfl: 1 .  fluocinonide-emollient (LIDEX-E) 0.05 % cream, Apply 1 application topically 2 (two) times daily., Disp: 30 g, Rfl: 2 .  triamcinolone ointment (KENALOG) 0.1 %, Apply to affected areas twice daily for 1 wk, then daily for 1 wk, then every other day for 1 wk. Not to face., Disp: ,  Rfl:  .  ferrous sulfate 325 (65 FE) MG tablet, Take by mouth., Disp: , Rfl:  .  hydrocortisone 2.5 % ointment, Apply topically 2 (two) times daily. Small amount  To face no more than 2 weeks, Disp: 20 g, Rfl: 0 .  hydrocortisone cream 0.5 %, Apply 1 application topically 2 (two) times daily., Disp: 30 g, Rfl: 0 .  hydrOXYzine (ATARAX/VISTARIL) 25 MG tablet, Take 1 tablet (25 mg total) by mouth every 8 (eight) hours as needed for itching., Disp: 15 tablet, Rfl: 0 .  mometasone (ELOCON) 0.1 % ointment, Apply topically daily., Disp: 45 g, Rfl: 2  EXAM: BP Readings from Last 3 Encounters:  03/05/18 122/84  09/26/17 124/76  09/19/16 120/60    VITALS per patient if applicable: 366 weight  ( weight  Pre surgery  285)  GENERAL: alert, oriented, appears well and in no acute distress  HEENT: atraumatic, conjunttiva clear, no obvious abnormalities on inspection of external nose and ears NECK: normal movements of the head and neck LUNGS: on inspection no signs of respiratory distress, breathing rate appears normal, no obvious gross SOB, gasping or wheezing CV: no obvious cyanosis PSYCH/NEURO: pleasant and cooperative, no obvious depression or anxiety, speech and thought processing grossly intact Lab Results  Component Value Date   WBC 7.6 09/26/2017   HGB 12.0 09/26/2017   HCT 36.2 09/26/2017   PLT 337.0 09/26/2017   GLUCOSE 73 09/26/2017   CHOL 142 09/26/2017   TRIG 172.0 (H) 09/26/2017   HDL 43.70 09/26/2017   LDLCALC 64 09/26/2017   ALT 22 09/26/2017   AST 27 09/26/2017   NA 140 09/26/2017   K 4.1 09/26/2017   CL 105 09/26/2017   CREATININE 1.13 09/26/2017   BUN 11 09/26/2017   CO2 27 09/26/2017   TSH 2.55 09/26/2017   HGBA1C 5.7 09/26/2017    ASSESSMENT AND PLAN:  Discussed the following assessment and plan:    ICD-10-CM   1. BMI 40.0-44.9, adult (HCC)  Z68.41 Amb Ref to Medical Weight Management  2. S/P laparoscopic sleeve gastrectomy  Z98.84 Amb Ref to Medical Weight Management  3. Burning sensation of throat  R07.0    Disc  various causes of intermittent sx and no alarm sx at this time    She is interested in more help with weight management  Will do referral    Fu if alamr sx and  persistent or progressive issues  Counseled.   Expectant management and discussion of plan and treatment with opportunity to ask questions and all were answered. The patient agreed with the plan and demonstrated an understanding of the instructions.   Advised to call back or seek an in-person evaluation if worsening  or having  further concerns .  Shanon Ace, MD

## 2018-09-21 NOTE — Patient Instructions (Signed)
Health Maintenance Due  Topic Date Due  . TETANUS/TDAP  10/02/2016    Depression screen PHQ 2/9 10/06/2015  Decreased Interest 3  Down, Depressed, Hopeless 2  PHQ - 2 Score 5  Altered sleeping 3  Tired, decreased energy 2  Change in appetite 3  Feeling bad or failure about yourself  3  Trouble concentrating 3  Moving slowly or fidgety/restless 2  Suicidal thoughts 0  PHQ-9 Score 21  Difficult doing work/chores Not difficult at all

## 2018-10-02 NOTE — Progress Notes (Signed)
Chief Complaint  Patient presents with   burning in throat    Pt states throat is extremly dry and burning on and off for 2 weeks     HPI: Tiffany Roth 28 y.o. come in for on going sx of burning throat  On left side  Not assoc with swallowing or breathing but persistent    No nvd hard time lowing weight  Denies choking  Pre problem and hx of same  No fever  c/onew congestion    Sob  Has OSA   On cpap    Eczema  Ok worse with period . Not sure when due for  Labs for DI Dr Moshe Cipro    ROS: See pertinent positives and negatives per HPI.  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, was on thyroid replacement as a child   Prematurity    "3 months" 1# 15 oz" ventilator    Sinus infection    recently diagnosed currently on antibiotic will have completed dosage prior to surgery    Sleep apnea    uses CPAP    Varicose veins    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    Family History  Problem Relation Age of Onset   Thyroid disease Mother    Hypertension Mother     Social History   Socioeconomic History   Marital status: Single    Spouse name: Not on file   Number of children: Not on file   Years of education: Not on file   Highest education level: Not on file  Occupational History   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    Employer: [employer]  Social Designer, fashion/clothing strain: Not on file   Food insecurity    Worry: Not on file    Inability: Not on file   Transportation needs    Medical: Not on file    Non-medical: Not on file  Tobacco Use   Smoking status: Never Smoker   Smokeless tobacco: Never Used  Substance and Sexual Activity   Alcohol use: No   Drug use: No   Sexual activity: Not on file  Lifestyle   Physical activity     Days per week: Not on file    Minutes per session: Not on file   Stress: Not on file  Relationships   Social connections    Talks on phone: Not on file    Gets together: Not on file    Attends religious service: Not on file    Active member of club or organization: Not on file    Attends meetings of clubs or organizations: Not on file    Relationship status: Not on file  Other Topics Concern   Not on file  Social History Narrative   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    Doing much better less stress   Going to grad school in august .  [place] in [employer] education .     Outpatient Medications Prior to Visit  Medication Sig Dispense Refill   clobetasol ointment (TEMOVATE) 4.12 % Apply 1 application  topically 2 (two) times daily. To hands  For  2 weeks and then  As directed 30 g 1   desmopressin (DDAVP) 0.01 % SOLN Place 1 spray into the nose 2 (two) times daily. 1 Bottle 1   ferrous sulfate 325 (65 FE) MG tablet Take by mouth.     fluocinonide-emollient (LIDEX-E) 0.05 % cream Apply 1 application topically 2 (two) times daily. 30 g 2   hydrocortisone 2.5 % ointment Apply topically 2 (two) times daily. Small amount  To face no more than 2 weeks 20 g 0   hydrocortisone cream 0.5 % Apply 1 application topically 2 (two) times daily. 30 g 0   hydrOXYzine (ATARAX/VISTARIL) 25 MG tablet Take 1 tablet (25 mg total) by mouth every 8 (eight) hours as needed for itching. 15 tablet 0   mometasone (ELOCON) 0.1 % ointment Apply topically daily. 45 g 2   triamcinolone ointment (KENALOG) 0.1 % Apply to affected areas twice daily for 1 wk, then daily for 1 wk, then every other day for 1 wk. Not to face.     No facility-administered medications prior to visit.      EXAM:  BP 122/64 (BP Location: Right Arm, Patient Position: Sitting, Cuff Size:  Large)    Pulse (!) 102    Temp 97.8 F (36.6 C) (Oral)    Wt 240 lb (108.9 kg)    LMP 09/14/2018    SpO2 97%    BMI 44.25 kg/m   Body mass index is 44.25 kg/m.  GENERAL: vitals reviewed and listed above, alert, oriented, appears well hydrated and in no acute distress HEENT: atraumatic, conjunctiva  clear, no obvious abnormalities on inspection of external nose and ears OP : no lesion edema or exudate  Tonsil 1+   No edema  tmx clear   NECK: no obvious masses on inspection  Pints to right paratrachea  Lower neck  Thyroid palpable no nodules? No masses effecta? CV: HRRR, no clubbing cyanosis or  peripheral edema nl cap refill  MS: moves all extremities without noticeable focal  Abnormality sking  Dry  Eczema   PSYCH: pleasant and cooperative, no obvious depression or anxiety Lab Results  Component Value Date   WBC 7.6 09/26/2017   HGB 12.0 09/26/2017   HCT 36.2 09/26/2017   PLT 337.0 09/26/2017   GLUCOSE 73 09/26/2017   CHOL 142 09/26/2017   TRIG 172.0 (H) 09/26/2017   HDL 43.70 09/26/2017   LDLCALC 64 09/26/2017   ALT 22 09/26/2017   AST 27 09/26/2017   NA 140 09/26/2017   K 4.1 09/26/2017   CL 105 09/26/2017   CREATININE 1.13 09/26/2017   BUN 11 09/26/2017   CO2 27 09/26/2017   TSH 2.55 09/26/2017   HGBA1C 5.7 09/26/2017   BP Readings from Last 3 Encounters:  10/03/18 122/64  03/05/18 122/84  09/26/17 124/76    ASSESSMENT AND PLAN:   ICD-10-CM   1. Burning sensation of throat  A12.8 Basic metabolic panel    CBC with Differential/Platelet    Hemoglobin A1c    Hepatic function panel    TSH    T4, free    T3, free    Vitamin B12    Vitamin B1    US SOFT TISSUE HEAD & NECK (NON-THYROID)    CANCELED: Lipid panel  2. S/P laparoscopic sleeve gastrectomy  N86.76 Basic metabolic panel    CBC with Differential/Platelet    Hemoglobin A1c    Hepatic function panel  TSH    T4, free    T3, free    Vitamin B12    Vitamin B1    CANCELED: Lipid panel  3. Diabetes  insipidus (HCC)  T62.5 Basic metabolic panel    CBC with Differential/Platelet    Hemoglobin A1c    Hepatic function panel    TSH    T4, free    T3, free    Vitamin B12    Vitamin B1    CANCELED: Lipid panel  4. Hypothyroidism, unspecified type  W38.9 Basic metabolic panel    CBC with Differential/Platelet    Hemoglobin A1c    Hepatic function panel    TSH    T4, free    T3, free    Vitamin B12    Vitamin B1    CANCELED: Lipid panel  5. OSA (obstructive sleep apnea)  H73.42 Basic metabolic panel    CBC with Differential/Platelet    Hemoglobin A1c    Hepatic function panel    TSH    T4, free    T3, free    Vitamin B12    Vitamin B1    CANCELED: Lipid panel  6. Prediabetes  A76.81 Basic metabolic panel    CBC with Differential/Platelet    Hemoglobin A1c    Hepatic function panel    TSH    T4, free    T3, free    Vitamin B12    Vitamin B1    CANCELED: Lipid panel  7. Palpable thyroid  E07.89 US SOFT TISSUE HEAD & NECK (NON-THYROID)  8. History of hypothyroidism as a child  Z86.39 US SOFT TISSUE HEAD & NECK (NON-THYROID)    Discussed Update labs send to dr Moshe Cipro also  ? If related to airway vs esophagus but no alarms x persistence   Area is localized   Try empiric pepcid bid   For 1-2 weeks   Thyroid  may be palpable no adenopathy .  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  Still very concerned about  Weight  And believes something in side causing wegiht   Issues    -Patient advised to return or notify health care team  if  new concerns arise.  Patient Instructions  Lab today   We will order the  Ultrasound of your thyroid .   And next step is seeing  ENT   To check out.   If gets worse let us know.  Try pepcid twice a day   For a few weeks and see  If not getting better then  We will do ent referral   Standley Brooking. Angele Wiemann M.D.

## 2018-10-03 ENCOUNTER — Other Ambulatory Visit: Payer: Self-pay

## 2018-10-03 ENCOUNTER — Ambulatory Visit: Payer: BC Managed Care – PPO | Admitting: Internal Medicine

## 2018-10-03 ENCOUNTER — Encounter: Payer: Self-pay | Admitting: Internal Medicine

## 2018-10-03 VITALS — BP 122/64 | HR 102 | Temp 97.8°F | Wt 240.0 lb

## 2018-10-03 DIAGNOSIS — E232 Diabetes insipidus: Secondary | ICD-10-CM | POA: Diagnosis not present

## 2018-10-03 DIAGNOSIS — G4733 Obstructive sleep apnea (adult) (pediatric): Secondary | ICD-10-CM

## 2018-10-03 DIAGNOSIS — E039 Hypothyroidism, unspecified: Secondary | ICD-10-CM

## 2018-10-03 DIAGNOSIS — Z8639 Personal history of other endocrine, nutritional and metabolic disease: Secondary | ICD-10-CM

## 2018-10-03 DIAGNOSIS — R7303 Prediabetes: Secondary | ICD-10-CM | POA: Diagnosis not present

## 2018-10-03 DIAGNOSIS — Z9884 Bariatric surgery status: Secondary | ICD-10-CM

## 2018-10-03 DIAGNOSIS — E0789 Other specified disorders of thyroid: Secondary | ICD-10-CM

## 2018-10-03 DIAGNOSIS — R070 Pain in throat: Secondary | ICD-10-CM | POA: Diagnosis not present

## 2018-10-03 NOTE — Patient Instructions (Signed)
Lab today   We will order the  Ultrasound of your thyroid .   And next step is seeing  ENT   To check out.   If gets worse let us know.  Try pepcid twice a day   For a few weeks and see  If not getting better then  We will do ent referral

## 2018-10-04 LAB — CBC WITH DIFFERENTIAL/PLATELET
Basophils Absolute: 0.1 10*3/uL (ref 0.0–0.1)
Basophils Relative: 0.8 % (ref 0.0–3.0)
Eosinophils Absolute: 0.2 10*3/uL (ref 0.0–0.7)
Eosinophils Relative: 3.3 % (ref 0.0–5.0)
HCT: 37.6 % (ref 36.0–46.0)
Hemoglobin: 12.2 g/dL (ref 12.0–15.0)
Lymphocytes Relative: 36.7 % (ref 12.0–46.0)
Lymphs Abs: 2.5 10*3/uL (ref 0.7–4.0)
MCHC: 32.3 g/dL (ref 30.0–36.0)
MCV: 90.8 fl (ref 78.0–100.0)
Monocytes Absolute: 0.6 10*3/uL (ref 0.1–1.0)
Monocytes Relative: 9 % (ref 3.0–12.0)
Neutro Abs: 3.4 10*3/uL (ref 1.4–7.7)
Neutrophils Relative %: 50.2 % (ref 43.0–77.0)
Platelets: 324 10*3/uL (ref 150.0–400.0)
RBC: 4.14 Mil/uL (ref 3.87–5.11)
RDW: 15.3 % (ref 11.5–15.5)
WBC: 6.8 10*3/uL (ref 4.0–10.5)

## 2018-10-04 LAB — BASIC METABOLIC PANEL
BUN: 12 mg/dL (ref 6–23)
CO2: 24 mEq/L (ref 19–32)
Calcium: 9.8 mg/dL (ref 8.4–10.5)
Chloride: 111 mEq/L (ref 96–112)
Creatinine, Ser: 1.21 mg/dL — ABNORMAL HIGH (ref 0.40–1.20)
GFR: 63.2 mL/min (ref >60.00)
Glucose, Bld: 81 mg/dL (ref 70–99)
Potassium: 4.2 mEq/L (ref 3.5–5.1)
Sodium: 143 mEq/L (ref 135–145)

## 2018-10-04 LAB — T3, FREE: T3, Free: 3.4 pg/mL (ref 2.3–4.2)

## 2018-10-04 LAB — HEPATIC FUNCTION PANEL
ALT: 14 U/L (ref 0–35)
AST: 23 U/L (ref 0–37)
Albumin: 3.7 g/dL (ref 3.5–5.2)
Alkaline Phosphatase: 75 U/L (ref 39–117)
Bilirubin, Direct: 0.2 mg/dL (ref 0.0–0.3)
Total Bilirubin: 1 mg/dL (ref 0.2–1.2)
Total Protein: 6.1 g/dL (ref 6.0–8.3)

## 2018-10-04 LAB — VITAMIN B12: Vitamin B-12: 831 pg/mL (ref 211–911)

## 2018-10-04 LAB — T4, FREE: Free T4: 1.38 ng/dL (ref 0.60–1.60)

## 2018-10-04 LAB — HEMOGLOBIN A1C: Hgb A1c MFr Bld: 5.4 % (ref 4.6–6.5)

## 2018-10-04 LAB — TSH: TSH: 1.63 u[IU]/mL (ref 0.35–4.50)

## 2018-10-04 NOTE — Addendum Note (Signed)
Addended by: Diona Foley on: 10/04/2018 09:06 AM   Modules accepted: Orders

## 2018-10-15 ENCOUNTER — Other Ambulatory Visit: Payer: BC Managed Care – PPO

## 2018-11-14 ENCOUNTER — Encounter: Payer: Self-pay | Admitting: Internal Medicine

## 2019-02-18 ENCOUNTER — Telehealth: Payer: Self-pay | Admitting: Internal Medicine

## 2019-02-18 NOTE — Telephone Encounter (Signed)
Copied from [place] [id number]. Topic: General - Other >> Feb 18, 2019  1:50 PM Keene Breath wrote: Reason for CRM: Patient would like a referral to a neurologist because of her severe migraines.  Please advise and call to discuss at [phone]

## 2019-02-18 NOTE — Telephone Encounter (Signed)
Message routed to PCP CMA  

## 2019-02-18 NOTE — Telephone Encounter (Signed)
Please advise 

## 2019-02-20 ENCOUNTER — Telehealth: Payer: Self-pay | Admitting: *Deleted

## 2019-02-20 NOTE — Telephone Encounter (Signed)
Copied from [place] [phone]. Topic: General - Other >> Feb 18, 2019  1:50 PM Keene Breath wrote: Reason for CRM: Patient would like a referral to a neurologist because of her severe migraines.  Please advise and call to discuss at [phone] >> Feb 20, 2019  3:16 PM Wynetta Emery, [place] C wrote: Pt called in to follow up on referral request.   Please advise pt directly.

## 2019-02-20 NOTE — Telephone Encounter (Signed)
    Left detailed message letting patient know Dr.Panosh is out of the office and we will let her know next week   Copied from [place] [phone]. Topic: General - Other >> Feb 18, 2019  1:50 PM Keene Breath wrote: Reason for CRM: Patient would like a referral to a neurologist because of her severe migraines.  Please advise and call to discuss at [phone] >> Feb 20, 2019  3:16 PM Wynetta Emery, [place] C wrote: Pt called in to follow up on referral request.   Please advise pt directly.

## 2019-02-23 NOTE — Telephone Encounter (Signed)
Ok to do referral   However  May need more information/assessment   for neurology to accept the referral . should also consider   Updated blood work.  Advise virtual visit  For above reasons ,  Please arrange

## 2019-02-25 NOTE — Telephone Encounter (Signed)
Lvm for pt to call back crm created and mychart message sent

## 2019-02-26 NOTE — Progress Notes (Signed)
Virtual Visit via Video Note  I connected with@ on 12 23 20  at  2:30 PM EST by a video enabled telemedicine application and verified that I am speaking with the correct person using two identifiers. Location patient: home Location provider:home office Persons participating in the virtual visit: patient, provider  [place] national recommendations  regarding COVID 19 pandemic   video visit is advised over in office visit for this patient.  Patient aware  of the limitations of evaluation and management by telemedicine and  availability of in person appointments. and agreed to proceed.   HPI: Tiffany Roth presents for video visit  see messages  To get referral regarding her headaches  For the last 3-4 months having frequent headaches without assoc sx  Sometimes   On side or another in temporal area .  Usually   As the day goes on   No head trauma.   Uses c pap for her OSA and feels adequately rx .  thyroid and DI  Is stable  Last labs done end of July.   Taking  tylenol    ROS: See pertinent positives and negatives per HPI. No fever change in meds    Past Medical History:  Diagnosis Date  . Eczema   . 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 Prematurity    "3 months" 1# 15 oz" ventilator   . Sinus infection    recently diagnosed currently on antibiotic will have completed dosage prior to surgery   . Sleep apnea    uses CPAP   . Varicose veins    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    Past Surgical History:  Procedure Laterality Date  . LAPAROSCOPIC GASTRIC SLEEVE RESECTION N/A 06/02/2014   Procedure: LAPAROSCOPIC GASTRIC SLEEVE RESECTION;  Surgeon: Greer Pickerel, MD;  Location: [facility];  Service: General;  Laterality: N/A;  . UPPER GI ENDOSCOPY N/A 06/02/2014   Procedure: UPPER GI  ENDOSCOPY;  Surgeon: Greer Pickerel, MD;  Location: [facility];  Service: General;  Laterality: N/A;  . varicose veins      surgically repaired 2011    Family History  Problem Relation Age of Onset  . Thyroid disease Mother   . Hypertension Mother     Current Outpatient Medications:  .  clobetasol ointment (TEMOVATE) [id number] %, Apply 1 application topically 2 (two) times daily. To hands  For  2 weeks and then  As directed, Disp: 30 g, Rfl: 1 .  desmopressin (DDAVP) 0.01 % SOLN, Place 1 spray into the nose 2 (two) times daily., Disp: 1 Bottle, Rfl: 1 .  ferrous sulfate 325 (65 FE) MG tablet, Take by mouth., Disp: , Rfl:  .  fluocinonide-emollient (LIDEX-E) 0.05 % cream, Apply 1 application topically 2 (two) times daily., Disp: 30 g, Rfl: 2 .  hydrocortisone 2.5 % ointment, Apply topically 2 (two) times daily. Small amount  To face no more than 2 weeks, Disp: 20 g, Rfl: 0 .  hydrocortisone cream 0.5 %, Apply 1 application topically 2 (two) times daily., Disp: 30 g, Rfl: 0 .  hydrOXYzine (ATARAX/VISTARIL) 25 MG tablet, Take 1 tablet (25 mg total) by mouth every 8 (eight) hours as needed for itching., Disp: 15 tablet, Rfl: 0 .  mometasone (ELOCON) 0.1 % ointment,  Apply topically daily., Disp: 45 g, Rfl: 2 .  triamcinolone ointment (KENALOG) 0.1 %, Apply to affected areas twice daily for 1 wk, then daily for 1 wk, then every other day for 1 wk. Not to face., Disp: , Rfl:   EXAM: BP Readings from Last 3 Encounters:  10/03/18 122/64  03/05/18 122/84  09/26/17 124/76    VITALS per patient if applicable:  GENERAL: alert, oriented, appears well and in no acute distress  HEENT: atraumatic, conjunttiva clear, no obvious abnormalities on inspection of external nose and ears  NECK: normal movements of the head and neck LUNGS: on inspection no signs of respiratory distress, breathing rate appears normal, no obvious gross SOB, gasping or wheezing CV: no obvious cyanosis PSYCH/NEURO: pleasant and  cooperative, no obvious depression or anxiety, speech and thought processing grossly intact Lab Results  Component Value Date   WBC 6.8 10/03/2018   HGB 12.2 10/03/2018   HCT 37.6 10/03/2018   PLT 324.0 10/03/2018   GLUCOSE 81 10/03/2018   CHOL 142 09/26/2017   TRIG 172.0 (H) 09/26/2017   HDL 43.70 09/26/2017   LDLCALC 64 09/26/2017   ALT 14 10/03/2018   AST 23 10/03/2018   NA 143 10/03/2018   K 4.2 10/03/2018   CL 111 10/03/2018   CREATININE 1.21 (H) 10/03/2018   BUN 12 10/03/2018   CO2 24 10/03/2018   TSH 1.63 10/03/2018   HGBA1C 5.4 10/03/2018    ASSESSMENT AND PLAN:  Discussed the following assessment and plan:    ICD-10-CM   1. Frequent headaches  R51.9 Ambulatory referral to Neurology   No alarm  Findings x  New onset  For 3 months   Counseled.   Expectant management and discussion of plan and treatment with opportunity to ask questions and all were answered. The patient agreed with the plan and demonstrated an understanding of the instructions.   Advised to call back or seek an in-person evaluation if worsening  or having  further concerns . Return for when due  or as needed .   Shanon Ace, MD

## 2019-02-26 NOTE — Telephone Encounter (Signed)
Pt has been made appt  

## 2019-02-27 ENCOUNTER — Encounter: Payer: Self-pay | Admitting: Internal Medicine

## 2019-02-27 ENCOUNTER — Other Ambulatory Visit: Payer: Self-pay

## 2019-02-27 ENCOUNTER — Telehealth (INDEPENDENT_AMBULATORY_CARE_PROVIDER_SITE_OTHER): Payer: BC Managed Care – PPO | Admitting: Internal Medicine

## 2019-02-27 DIAGNOSIS — R519 Headache, unspecified: Secondary | ICD-10-CM

## 2019-05-02 ENCOUNTER — Ambulatory Visit: Payer: BC Managed Care – PPO | Admitting: Neurology

## 2019-05-06 ENCOUNTER — Ambulatory Visit: Payer: BC Managed Care – PPO | Attending: Internal Medicine

## 2019-05-06 DIAGNOSIS — Z23 Encounter for immunization: Secondary | ICD-10-CM | POA: Insufficient documentation

## 2019-05-06 NOTE — Progress Notes (Signed)
   Covid-19 Vaccination Clinic  Name:  SHAUNITA DAVIAU    MRN: [id number] DOB: 25-Jul-1990  05/06/2019  Ms. Glanzer was observed post Covid-19 immunization for 15 minutes without incidence. She was provided with Vaccine Information Sheet and instruction to access the V-Safe system.   Ms. Cease was instructed to call 911 with any severe reactions post vaccine: Marland Kitchen Difficulty breathing  . Swelling of your face and throat  . A fast heartbeat  . A bad rash all over your body  . Dizziness and weakness    Immunizations Administered    Name Date Dose VIS Date Route   Pfizer COVID-19 Vaccine 05/06/2019  4:14 PM 0.3 mL 02/15/2019 Intramuscular   Manufacturer: [place]   Lot: [id number]   [place]: [id number]

## 2019-05-29 ENCOUNTER — Ambulatory Visit: Payer: BC Managed Care – PPO | Attending: Internal Medicine

## 2019-05-29 DIAGNOSIS — Z23 Encounter for immunization: Secondary | ICD-10-CM

## 2019-05-29 NOTE — Progress Notes (Signed)
   Covid-19 Vaccination Clinic  Name:  Tiffany Roth    MRN: [id number] DOB: 10-Nov-1990  05/29/2019  Tiffany Roth was observed post Covid-19 immunization for 15 minutes without incident. She was provided with Vaccine Information Sheet and instruction to access the V-Safe system.   Tiffany Roth was instructed to call 911 with any severe reactions post vaccine: Marland Kitchen Difficulty breathing  . Swelling of face and throat  . A fast heartbeat  . A bad rash all over body  . Dizziness and weakness   Immunizations Administered    Name Date Dose VIS Date Route   Pfizer COVID-19 Vaccine 05/29/2019  3:28 PM 0.3 mL 02/15/2019 Intramuscular   Manufacturer: [place]   Lot: [id number]   [place]: [id number]

## 2019-07-29 ENCOUNTER — Telehealth: Payer: Self-pay | Admitting: Internal Medicine

## 2019-07-29 NOTE — Telephone Encounter (Signed)
Patient states she can't come to see Dr. Regis Bill until Wednesday. Pt said her sciatic nerve is flared up on her L thigh so she is asking if there is something she could take until she could get her.

## 2019-07-29 NOTE — Telephone Encounter (Signed)
I dont recall why you aren't to take ibuprofen  I( since there are others in the group that may help  the pain   You  can try OTc pat pain patches locally over the pain area ( if you dont break out from patches)   In addition to tylenol  500 every 8 hours

## 2019-07-29 NOTE — Telephone Encounter (Signed)
Called patient and gave her the message per Dr. Regis Bill. Patient verbalized an understanding and has an appointment with [place] on Wednesday.

## 2019-07-29 NOTE — Telephone Encounter (Signed)
Please see message.  Please advise. 

## 2019-07-30 ENCOUNTER — Other Ambulatory Visit: Payer: Self-pay

## 2019-07-30 ENCOUNTER — Ambulatory Visit: Payer: BC Managed Care – PPO | Admitting: Internal Medicine

## 2019-07-30 NOTE — Progress Notes (Deleted)
No chief complaint on file.   HPI: Tiffany Roth 29 y.o. come in for   ongoing sciatica sx   See notes  ROS: See pertinent positives and negatives per HPI.  Past Medical History:  Diagnosis Date  . Eczema   . 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 Prematurity    "3 months" 1# 15 oz" ventilator   . Sinus infection    recently diagnosed currently on antibiotic will have completed dosage prior to surgery   . Sleep apnea    uses CPAP   . 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 Thyroid disease Mother   . Hypertension Mother     Social History   Socioeconomic History  . Marital status: Single    Spouse name: Not on file  . Number of children: Not on file  . Years of education: Not on file  . Highest education level: Not on file  Occupational History  . 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 Smoking status: Never Smoker  . Smokeless tobacco: Never Used  Substance and Sexual Activity  . Alcohol use: No  . Drug use: No  . Sexual activity: Not on file  Other Topics Concern  . 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    Doing much better less stress   Going to grad school in august .  [place] in [employer] education .    Social Determinants of Health   Financial Resource Strain:   . Difficulty of Paying Living Expenses:   Food Insecurity:   . Worried About Charity fundraiser in the Last Year:   . Arboriculturist in the Last Year:   Transportation Needs:   . Film/video editor (Medical):   Marland Kitchen  Lack of Transportation (Non-Medical):   Physical Activity:   . Days of Exercise per Week:   . Minutes of Exercise per Session:   Stress:   . Feeling of Stress :   Social Connections:   . Frequency of Communication with Friends and Family:   . Frequency of Social Gatherings with Friends and Family:   . Attends Religious Services:   . Active Member of Clubs or Organizations:   . Attends Archivist Meetings:   Marland Kitchen Marital Status:     Outpatient Medications Prior to Visit  Medication Sig Dispense Refill  . clobetasol ointment (TEMOVATE) [id number] % Apply 1 application topically 2 (two) times daily. To hands  For  2 weeks and then  As directed 30 g 1  . desmopressin (DDAVP) 0.01 % SOLN Place 1 spray into the nose 2 (two) times daily. 1 Bottle 1  . ferrous sulfate 325 (65 FE) MG tablet Take by mouth.    . fluocinonide-emollient (LIDEX-E) 0.05 % cream Apply 1 application topically 2 (two) times daily. 30 g 2  . hydrocortisone 2.5 % ointment Apply topically 2 (two) times daily. Small amount  To face no more than 2 weeks 20 g 0  . hydrocortisone cream 0.5 % Apply 1 application topically 2 (two) times daily. 30 g 0  . hydrOXYzine (ATARAX/VISTARIL) 25 MG tablet Take 1 tablet (25 mg total) by mouth every 8 (eight) hours as needed for itching. 15 tablet 0  . mometasone (ELOCON) 0.1 % ointment Apply topically daily. 45 g 2  . triamcinolone ointment (KENALOG) 0.1 % Apply to affected areas twice daily for 1 wk, then daily for 1 wk, then every other day for 1 wk. Not to face.     No facility-administered medications prior to visit.     EXAM:  There were no vitals taken for this visit.  There is no height or weight on file to calculate BMI.  GENERAL: vitals reviewed and listed above, alert, oriented, appears well hydrated and in no acute distress HEENT: atraumatic, conjunctiva  clear, no obvious abnormalities on inspection of external nose and ears OP : no lesion edema or exudate  NECK: no  obvious masses on inspection palpation  LUNGS: clear to auscultation bilaterally, no wheezes, rales or rhonchi, good air movement CV: HRRR, no clubbing cyanosis or  peripheral edema nl cap refill  MS: moves all extremities without noticeable focal  abnormality PSYCH: pleasant and cooperative, no obvious depression or anxiety Lab Results  Component Value Date   WBC 6.8 10/03/2018   HGB 12.2 10/03/2018   HCT 37.6 10/03/2018   PLT 324.0 10/03/2018   GLUCOSE 81 10/03/2018   CHOL 142 09/26/2017   TRIG 172.0 (H) 09/26/2017   HDL 43.70 09/26/2017   LDLCALC 64 09/26/2017   ALT 14 10/03/2018   AST 23 10/03/2018   NA 143 10/03/2018   K 4.2 10/03/2018   CL 111 10/03/2018   CREATININE 1.21 (H) 10/03/2018   BUN 12 10/03/2018   CO2 24 10/03/2018   TSH 1.63 10/03/2018   HGBA1C 5.4 10/03/2018   BP Readings from Last 3 Encounters:  10/03/18 122/64  03/05/18 122/84  09/26/17 124/76    ASSESSMENT AND PLAN:  Discussed the following assessment and plan:  No diagnosis found.  -Patient advised to return or notify health care team  if  new concerns arise.  There are no Patient Instructions on file for this visit.   Standley Brooking. Ketty Bitton M.D.

## 2019-07-31 ENCOUNTER — Ambulatory Visit: Payer: BC Managed Care – PPO | Admitting: Internal Medicine

## 2019-07-31 DIAGNOSIS — Z0289 Encounter for other administrative examinations: Secondary | ICD-10-CM

## 2019-11-28 LAB — HM PAP SMEAR

## 2019-12-21 ENCOUNTER — Ambulatory Visit: Payer: BC Managed Care – PPO | Attending: Internal Medicine

## 2019-12-21 ENCOUNTER — Other Ambulatory Visit: Payer: Self-pay

## 2019-12-21 DIAGNOSIS — Z23 Encounter for immunization: Secondary | ICD-10-CM

## 2019-12-21 NOTE — Progress Notes (Signed)
° °  Covid-19 Vaccination Clinic  Name:  MIRABELLE CYPHERS    MRN: [id number] DOB: Jul 25, 1990  12/21/2019  Ms. Gundy was observed post Covid-19 immunization for 15 minutes without incident. She was provided with Vaccine Information Sheet and instruction to access the V-Safe system.   Ms. Glade was instructed to call 911 with any severe reactions post vaccine:  Difficulty breathing   Swelling of face and throat   A fast heartbeat   A bad rash all over body   Dizziness and weakness

## 2020-09-10 ENCOUNTER — Telehealth: Payer: BC Managed Care – PPO | Admitting: Internal Medicine

## 2020-09-11 ENCOUNTER — Telehealth (INDEPENDENT_AMBULATORY_CARE_PROVIDER_SITE_OTHER): Payer: Self-pay | Admitting: Family Medicine

## 2020-09-11 ENCOUNTER — Encounter: Payer: Self-pay | Admitting: Family Medicine

## 2020-09-11 VITALS — Ht 61.75 in

## 2020-09-11 DIAGNOSIS — J310 Chronic rhinitis: Secondary | ICD-10-CM

## 2020-09-11 DIAGNOSIS — R5383 Other fatigue: Secondary | ICD-10-CM

## 2020-09-11 DIAGNOSIS — R519 Headache, unspecified: Secondary | ICD-10-CM

## 2020-09-11 DIAGNOSIS — E232 Diabetes insipidus: Secondary | ICD-10-CM

## 2020-09-11 MED ORDER — FLUTICASONE PROPIONATE 50 MCG/ACT NA SUSP: 1.0000 | Freq: Two times a day (BID) | NASAL | 0 refills | Status: DC

## 2020-09-11 NOTE — Progress Notes (Signed)
Virtual Visit via Video Note I connected with Tiffany Roth on 09/11/20 by a video enabled telemedicine application and verified that I am speaking with the correct person using two identifiers.  Location patient: home Location provider:work office Persons participating in the virtual visit: patient, provider  I discussed the limitations of evaluation and management by telemedicine and the availability of in person appointments. The patient expressed understanding and agreed to proceed.  Chief Complaint  Patient presents with   covid symptoms   HPI: Tiffany Roth is a 30 year old female with history of eczema, OSA, hypothyroidism, and DI complaining of 2 weeks of constant fatigue. Negative for fever, chills, change in appetite, anosmia,ageusia,cough, wheezing, CP, abdominal pain, N/V, changes in bowel habits, urinary symptoms, body aches, or skin rash.  Initially she had sore throat, this has resolved. Still having rhinorrhea, nasal congestion, sinus pressure, and postnasal drainage. Taking Claritin 10 mg daily.  Bitemporal sharp headache, which she thinks is migraines. No associated nausea, vomiting, photophobia, focal neurologic deficit, or visual aura. Headache is intermittent, almost daily, she has had about 9 episodes in the past 2 weeks. She has taken Tylenol.  Sleeping about 9 to 10 hours, she wakes up every hour to urinate. OSA: She has not been consistent with wearing her CPAP.  She thought it may be COVID-19 infection, home test today was negative. No sick contact or recent travel. COVID 19 vaccination completed, planning on getting her booster.  She had COVID-19 infection in 04/2020, recovered well except for occasional shortness of breath.  DI: She is not longer following with nephrologist, she tried to arrange appointment but was told she needed another referral. She has seen Dr. Moshe Cipro.  ROS: See pertinent positives and negatives per HPI.  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, was on thyroid replacement as a child   Prematurity    "3 months" 1# 15 oz" ventilator    Sinus infection    recently diagnosed currently on antibiotic will have completed dosage prior to surgery    Sleep apnea    uses CPAP    Varicose veins    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    Past Surgical History:  Procedure Laterality Date   LAPAROSCOPIC GASTRIC SLEEVE RESECTION N/A 06/02/2014   Procedure: LAPAROSCOPIC GASTRIC SLEEVE RESECTION;  Surgeon: Greer Pickerel, MD;  Location: [facility];  Service: General;  Laterality: N/A;   UPPER GI ENDOSCOPY N/A 06/02/2014   Procedure: UPPER GI ENDOSCOPY;  Surgeon: Greer Pickerel, MD;  Location: [facility];  Service: General;  Laterality: N/A;   varicose veins      surgically repaired 2011    Family History  Problem Relation Age of Onset   Thyroid disease Mother    Hypertension Mother     Social History   Socioeconomic History   Marital status: Single    Spouse name: Not on file   Number of children: Not on file   Years of education: Not on file   Highest education level: Not on file  Occupational History   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    Doing much better less stress   Going to grad school in august .  [place] in [employer] education .    Social Determinants of Health   Financial Resource Strain: Not on  file  Food Insecurity: Not on file  Transportation Needs: Not on file  Physical Activity: Not on file  Stress: Not on file  Social Connections: Not on file  Intimate Partner Violence: Not on file    Current Outpatient Medications:    ferrous sulfate 325 (65 FE) MG tablet, Take by mouth., Disp: , Rfl:   EXAM:  VITALS per patient if applicable:Ht 5' 9.40" (1.568 m)   BMI 44.25 kg/m   GENERAL: alert, oriented, appears well and in no acute distress  HEENT: atraumatic, conjunctiva clear, no obvious abnormalities on inspection.  NECK: normal movements of the head and neck  LUNGS: on inspection no signs of respiratory distress, breathing rate appears normal, no obvious gross SOB, gasping or wheezing  CV: no obvious cyanosis  MS: moves all visible extremities without noticeable abnormality  PSYCH/NEURO: pleasant and cooperative, no obvious depression or anxiety, speech and thought processing grossly intact  ASSESSMENT AND PLAN:  Discussed the following assessment and plan:  Fatigue, unspecified type Problem has been addressed by PCP in the past, it seems to be a chronic problem. We discussed possible etiologies: Systemic illness, immunologic,endocrinology,sleep disorder, psychiatric/psychologic, infectious,medications side effects, and idiopathic. Some of her chronic medical problem can be contributing factors. Strongly recommend wearing her CPAP.  Temporal headache Possible etiology discussed. ?  Tension headache. Allergies and OSA can also be a contributing factor. Instructed about warning signs. Follow with PCP as needed.  Rhinitis, unspecified type We discussed possible causes,? Post viral synd vs allergies. Flonase nasal spray daily as needed recommended. Nasal saline irrigations as needed. Continue OTC antihistaminic.  Diabetes insipidus ([place]) - Plan: Ambulatory referral to Nephrology She is not longer on treatment. Appointment with Dr. Moshe Cipro will be  arranged.  We discussed possible serious and likely etiologies, options for evaluation and workup, limitations of telemedicine visit vs in person visit, treatment, treatment risks and precautions.  I discussed the assessment and treatment plan with the patient. Tiffany Roth was provided an opportunity to ask questions and all were answered. She agreed with the plan and demonstrated an understanding of the instructions.  Return if symptoms worsen or fail to improve.   Alexa Blish [place], MD

## 2021-01-18 ENCOUNTER — Encounter: Payer: Self-pay | Admitting: Internal Medicine

## 2021-01-18 ENCOUNTER — Telehealth (INDEPENDENT_AMBULATORY_CARE_PROVIDER_SITE_OTHER): Payer: Self-pay | Admitting: Internal Medicine

## 2021-01-18 DIAGNOSIS — R7303 Prediabetes: Secondary | ICD-10-CM

## 2021-01-18 DIAGNOSIS — Z9884 Bariatric surgery status: Secondary | ICD-10-CM

## 2021-01-18 DIAGNOSIS — E232 Diabetes insipidus: Secondary | ICD-10-CM

## 2021-01-18 DIAGNOSIS — Z8639 Personal history of other endocrine, nutritional and metabolic disease: Secondary | ICD-10-CM

## 2021-01-18 NOTE — Progress Notes (Signed)
Virtual Visit via Video Note  I connected with   Tiffany Roth on 01/18/21 at  2:30 PM EST by a video enabled telemedicine application and verified that I am speaking with the correct person using two identifiers. Location patient: [employer] third grade class. Location provider:work  office Persons participating in the virtual visit: patient, provider  [place] national recommendations  regarding COVID 19 pandemic   video visit is advised over in office visit for this patient.  Patient aware  of the limitations of evaluation and management by telemedicine and  availability of in person appointments. and agreed to proceed.   HPI: Tiffany Roth presents for video visit  last visit w me 2020 Has been doing well but asks if I would be willing to set prescribe Montanaro for her to help her with her weight.  She is aware of people successful with this medicine. Lost more weight after the surgery and early sleeve gastrectomy but lifestyle is been difficult and she grazes all day and then has dinner. 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  Realizes she needs to change this Not taking bariatric vitamins currently but a multivitamin with some iron. Overall is doing well otherwise and looking forward to finishing her degrees in education. ROS: See pertinent positives and negatives per HPI.  No family or personal history of thyroid cancer.  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, was on thyroid replacement as a child   Prematurity    "3 months" 1# 15 oz" ventilator    Sinus infection     recently diagnosed currently on antibiotic will have completed dosage prior to surgery    Sleep apnea    uses CPAP    Varicose veins    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    Past Surgical History:  Procedure Laterality Date   LAPAROSCOPIC GASTRIC SLEEVE RESECTION N/A 06/02/2014   Procedure: LAPAROSCOPIC GASTRIC SLEEVE RESECTION;  Surgeon: Greer Pickerel, MD;  Location: [facility];  Service: General;  Laterality: N/A;   UPPER GI ENDOSCOPY N/A 06/02/2014   Procedure: UPPER GI ENDOSCOPY;  Surgeon: Greer Pickerel, MD;  Location: [facility];  Service: General;  Laterality: N/A;   varicose veins      surgically repaired 2011    Family History  Problem Relation Age of Onset   Thyroid disease Mother    Hypertension Mother     Social History   Tobacco Use   Smoking status: Never   Smokeless tobacco: Never  Substance Use Topics   Alcohol use: No   Drug use: No      Current Outpatient Medications:    ferrous sulfate 325 (65 FE) MG tablet, Take by mouth., Disp: , Rfl:    fluticasone (FLONASE) 50 MCG/ACT nasal spray, Place 1 spray into both nostrils 2 (two) times daily., Disp: 16 g, Rfl: 0  EXAM: BP Readings from Last 3 Encounters:  10/03/18 122/64  03/05/18 122/84  09/26/17 124/76    VITALS per patient if applicable:  GENERAL: alert, oriented, appears well and  in no acute distress looks well HEENT: atraumatic, conjunttiva clear, no obvious abnormalities on inspection of external nose and ears  NECK: normal movements of the head and neck LUNGS: on inspection no signs of respiratory distress, breathing rate appears normal, no obvious gross SOB, gasping or wheezing CV: no obvious cyanosis MS: moves all visible extremities without noticeable abnormality PSYCH/NEURO: pleasant and cooperative, no obvious depression or anxiety, speech and thought processing grossly intact Lab Results  Component Value Date   WBC 6.8 10/03/2018   HGB 12.2 10/03/2018   HCT 37.6 10/03/2018   PLT 324.0 10/03/2018   GLUCOSE 81  10/03/2018   CHOL 142 09/26/2017   TRIG 172.0 (H) 09/26/2017   HDL 43.70 09/26/2017   LDLCALC 64 09/26/2017   ALT 14 10/03/2018   AST 23 10/03/2018   NA 143 10/03/2018   K 4.2 10/03/2018   CL 111 10/03/2018   CREATININE 1.21 (H) 10/03/2018   BUN 12 10/03/2018   CO2 24 10/03/2018   TSH 1.63 10/03/2018   HGBA1C 5.4 10/03/2018    ASSESSMENT AND PLAN:  Discussed the following assessment and plan:    ICD-10-CM   1. Morbid obesity (HCC)  Y69.48 Basic metabolic panel    CBC with Differential/Platelet    Hemoglobin A1c    Hepatic function panel    Lipid panel    TSH    Vitamin B12    Vitamin B1    VITAMIN D 25 Hydroxy (Vit-D Deficiency, Fractures)    T4, free    T3    2. Diabetes insipidus (HCC)  N46.2 Basic metabolic panel    CBC with Differential/Platelet    Hemoglobin A1c    Hepatic function panel    Lipid panel    TSH    Vitamin B12    Vitamin B1    VITAMIN D 25 Hydroxy (Vit-D Deficiency, Fractures)    T4, free    T3    3. S/P laparoscopic sleeve gastrectomy  V03.50 Basic metabolic panel    CBC with Differential/Platelet    Hemoglobin A1c    Hepatic function panel    Lipid panel    TSH    Vitamin B12    Vitamin B1    VITAMIN D 25 Hydroxy (Vit-D Deficiency, Fractures)    T4, free    T3    4. Prediabetes  K93.81 Basic metabolic panel    CBC with Differential/Platelet    Hemoglobin A1c    Hepatic function panel    Lipid panel    TSH    Vitamin B12    Vitamin B1    VITAMIN D 25 Hydroxy (Vit-D Deficiency, Fractures)    T4, free    T3    5. Bariatric surgery status  W29.93 Basic metabolic panel    CBC with Differential/Platelet    Hemoglobin A1c    Hepatic function panel    Lipid panel    TSH    Vitamin B12    Vitamin B1    VITAMIN D 25 Hydroxy (Vit-D Deficiency, Fractures)    T4, free    T3    6. History of hypothyroidism  Z16.96 Basic metabolic panel    CBC with Differential/Platelet    Hemoglobin A1c    Hepatic function panel    Lipid  panel    TSH    Vitamin B12    Vitamin B1    VITAMIN D 25 Hydroxy (Vit-D Deficiency, Fractures)    T4, free    T3   Hypothalamic  related to prematurity.  Last check was euthyroid.    Patient requests trying  [name]  as she has seen others succeed .  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  She is aware that medicine will be helpful if she does not make this plan. Plan fasting labs she will make appointment [facility] at this time Thank you from there is appropriate we can order medication discussed side effect profile no contraindications although she has had a sleeve procedure not see that is a contraindication But to be successful she would have to do many of these other lifestyle changes. Apparently the DI and hypothalamic conditions are quiescent or normalized. Counseled.   Expectant management and discussion of plan and treatment with opportunity to ask questions and all were answered. The patient agreed with the plan and demonstrated an understanding of the instructions.   Advised to call back or seek an in-person evaluation if worsening  or having  further concerns  in interim. Return for depending on results and med  ordering .    Shanon Ace, MD

## 2021-01-27 ENCOUNTER — Other Ambulatory Visit (INDEPENDENT_AMBULATORY_CARE_PROVIDER_SITE_OTHER): Payer: BC Managed Care – PPO

## 2021-01-27 DIAGNOSIS — E232 Diabetes insipidus: Secondary | ICD-10-CM | POA: Diagnosis not present

## 2021-01-27 DIAGNOSIS — R7303 Prediabetes: Secondary | ICD-10-CM | POA: Diagnosis not present

## 2021-01-27 DIAGNOSIS — Z8639 Personal history of other endocrine, nutritional and metabolic disease: Secondary | ICD-10-CM

## 2021-01-27 DIAGNOSIS — Z9884 Bariatric surgery status: Secondary | ICD-10-CM

## 2021-01-27 LAB — BASIC METABOLIC PANEL
BUN: 7 mg/dL (ref 6–23)
CO2: 26 mEq/L (ref 19–32)
Calcium: 9.2 mg/dL (ref 8.4–10.5)
Chloride: 112 mEq/L (ref 96–112)
Creatinine, Ser: 1.23 mg/dL — ABNORMAL HIGH (ref 0.40–1.20)
GFR: 58.21 mL/min — ABNORMAL LOW (ref >60.00)
Glucose, Bld: 79 mg/dL (ref 70–99)
Potassium: 3.9 mEq/L (ref 3.5–5.1)
Sodium: 146 mEq/L — ABNORMAL HIGH (ref 135–145)

## 2021-01-27 LAB — VITAMIN D 25 HYDROXY (VIT D DEFICIENCY, FRACTURES): VITD: 32.2 ng/mL (ref 30.00–100.00)

## 2021-01-27 LAB — CBC WITH DIFFERENTIAL/PLATELET
Basophils Absolute: 0 10*3/uL (ref 0.0–0.1)
Basophils Relative: 0.6 % (ref 0.0–3.0)
Eosinophils Absolute: 0.2 10*3/uL (ref 0.0–0.7)
Eosinophils Relative: 2.5 % (ref 0.0–5.0)
HCT: 37.4 % (ref 36.0–46.0)
Hemoglobin: 11.9 g/dL — ABNORMAL LOW (ref 12.0–15.0)
Lymphocytes Relative: 31.6 % (ref 12.0–46.0)
Lymphs Abs: 2 10*3/uL (ref 0.7–4.0)
MCHC: 31.9 g/dL (ref 30.0–36.0)
MCV: 87.6 fl (ref 78.0–100.0)
Monocytes Absolute: 0.8 10*3/uL (ref 0.1–1.0)
Monocytes Relative: 11.9 % (ref 3.0–12.0)
Neutro Abs: 3.4 10*3/uL (ref 1.4–7.7)
Neutrophils Relative %: 53.4 % (ref 43.0–77.0)
Platelets: 326 10*3/uL (ref 150.0–400.0)
RBC: 4.27 Mil/uL (ref 3.87–5.11)
RDW: 13.8 % (ref 11.5–15.5)
WBC: 6.4 10*3/uL (ref 4.0–10.5)

## 2021-01-27 LAB — LIPID PANEL
Cholesterol: 133 mg/dL (ref 0–200)
HDL: 36 mg/dL — ABNORMAL LOW (ref >39.00)
LDL Cholesterol: 80 mg/dL (ref 0–99)
NonHDL: 96.79
Total CHOL/HDL Ratio: 4
Triglycerides: 82 mg/dL (ref 0.0–149.0)
VLDL: 16.4 mg/dL (ref 0.0–40.0)

## 2021-01-27 LAB — VITAMIN B12: Vitamin B-12: 671 pg/mL (ref 211–911)

## 2021-01-27 LAB — HEPATIC FUNCTION PANEL
ALT: 15 U/L (ref 0–35)
AST: 22 U/L (ref 0–37)
Albumin: 3.6 g/dL (ref 3.5–5.2)
Alkaline Phosphatase: 92 U/L (ref 39–117)
Bilirubin, Direct: 0.1 mg/dL (ref 0.0–0.3)
Total Bilirubin: 0.7 mg/dL (ref 0.2–1.2)
Total Protein: 6.9 g/dL (ref 6.0–8.3)

## 2021-01-27 LAB — T4, FREE: Free T4: 0.92 ng/dL (ref 0.60–1.60)

## 2021-01-27 LAB — TSH: TSH: 2.72 u[IU]/mL (ref 0.35–5.50)

## 2021-01-27 LAB — HEMOGLOBIN A1C: Hgb A1c MFr Bld: 5.9 % (ref 4.6–6.5)

## 2021-01-29 ENCOUNTER — Encounter: Payer: Self-pay | Admitting: Internal Medicine

## 2021-02-01 LAB — T3: T3, Total: 146 ng/dL (ref 76–181)

## 2021-02-01 LAB — VITAMIN B1: Vitamin B1 (Thiamine): 8 nmol/L (ref 8–30)

## 2021-02-01 NOTE — Progress Notes (Signed)
So results showed normal thyroid your sodium level slightly up again like when you had the DI symptoms ;kidney function about the same vitamin B1 is borderline low. Please take a B complex or bariatric vitamin. You do not have diabetes which is good.  Do not think your insurance will p cover some of the medicines we discussed.without the DM dx   But we can try a medicine could prescribe Wegovy because this is FDA approved for weight management.( Basically ozempic in a different form ( There may be a  supply s shortage. )  do  you want [place] to proceed with  injectable Wegovy trial and then  FU  in 6-8 weeks  ( need baseline weight recorded in chart)

## 2021-02-02 ENCOUNTER — Encounter: Payer: Self-pay | Admitting: Internal Medicine

## 2021-02-04 MED ORDER — MOUNJARO 2.5 MG/0.5ML ~~LOC~~ SOAJ: 2.5000 mg | SUBCUTANEOUS | 1 refills | Status: DC

## 2021-02-04 NOTE — Telephone Encounter (Signed)
I sent in a starter dose of Wegovy but I do not think it is going to be covered by your insurance.  It is possible Ozempic may be covered and we can order that if needed although I know there is a Nutritional therapist.  Plan follow-up in 1 month either way.

## 2021-02-05 NOTE — Telephone Encounter (Signed)
Patient called to follow up on the Mychart message that she sent last night. I let her know that the message had been sent to Dr.Panosh but she would be out of the office until 12/15 and I could send a message back to see if there was anything that could be done. Patient wants a callback from [facility] to discuss options since Dr.Panosh is out of the office for the time being.    Good callback number is [phone]     Please advise

## 2021-02-11 NOTE — Telephone Encounter (Signed)
I spoke with the pt and a PA was intimated on Mounjaro 2.5 mg. I am currently awaiting approval or denial by the pt's insurance.  Key: [id number]. The pt is aware the PA has been submitted and will be notified once the decision has been finalized.

## 2021-02-15 NOTE — Telephone Encounter (Signed)
PA- [id number] Mounjaro 2.5 mg has been denied by the patients insurance due to the medication the medication can be swithced to alrtenative such as Ozempic, Rybelsus, Trulicity and Victoza. Pt has been notified and inquired if alternative rx could be sent?

## 2021-02-16 ENCOUNTER — Telehealth: Payer: Self-pay | Admitting: Internal Medicine

## 2021-02-16 NOTE — Telephone Encounter (Signed)
Patient called to find out the update on medication refill.

## 2021-02-18 MED ORDER — OZEMPIC (0.25 OR 0.5 MG/DOSE) 2 MG/1.5ML ~~LOC~~ SOPN: 0.2500 mg | PEN_INJECTOR | SUBCUTANEOUS | 1 refills | Status: DC

## 2021-02-18 NOTE — Telephone Encounter (Signed)
Ozempic order sent to pharmacy  to begin 0.25 per week and then increase to 0.5 per week Make visit after 4 weeks to assess. Prefer a visit in person to get vital signs and weight in office

## 2021-02-18 NOTE — Telephone Encounter (Signed)
See later messages  related

## 2021-02-18 NOTE — Addendum Note (Signed)
Addended byShanon Ace K on: 02/18/2021 01:39 PM   Modules accepted: Orders

## 2021-02-24 ENCOUNTER — Telehealth: Payer: Self-pay | Admitting: Internal Medicine

## 2021-02-24 NOTE — Telephone Encounter (Signed)
Contact patient and get clinical information  and can make a visit either in person or visit virtual to discuss otherwise

## 2021-02-24 NOTE — Telephone Encounter (Signed)
Patient is experiencing a reaction to one medication that was prescribed by Dr.Panosh (Ozempic) and another prescribed by another doctor (Desmopressin). Patient would like a phone call back from [place] on what she should do.  Patient could be contacted at [phone].  Please advise.

## 2021-02-25 NOTE — Telephone Encounter (Signed)
Patient called asking when someone would give her a call back regarding the initial message about the reactions to the medications.   Informed patient that messages are being answered in between patients and that someone would give her a call back once they had the change.Told the patient that she could go ahead and schedule an appointment either virtually or in office with Dr. Regis Bill. Patient scheduled an appointment for 12/27.  She is wanting a call back on what she should do for the reactions in the meantime.  Patient can be reached at [phone].  Please advise.

## 2021-03-02 ENCOUNTER — Encounter: Payer: Self-pay | Admitting: Internal Medicine

## 2021-03-02 ENCOUNTER — Ambulatory Visit: Payer: BC Managed Care – PPO | Admitting: Internal Medicine

## 2021-03-02 DIAGNOSIS — T7840XA Allergy, unspecified, initial encounter: Secondary | ICD-10-CM

## 2021-03-02 DIAGNOSIS — E232 Diabetes insipidus: Secondary | ICD-10-CM

## 2021-03-02 DIAGNOSIS — Z79899 Other long term (current) drug therapy: Secondary | ICD-10-CM

## 2021-03-02 DIAGNOSIS — Z9884 Bariatric surgery status: Secondary | ICD-10-CM

## 2021-03-02 DIAGNOSIS — Z9109 Other allergy status, other than to drugs and biological substances: Secondary | ICD-10-CM

## 2021-03-02 DIAGNOSIS — R7303 Prediabetes: Secondary | ICD-10-CM | POA: Diagnosis not present

## 2021-03-02 DIAGNOSIS — L309 Dermatitis, unspecified: Secondary | ICD-10-CM

## 2021-03-02 MED ORDER — HYDROXYZINE PAMOATE 25 MG PO CAPS: 25.0000 mg | ORAL_CAPSULE | Freq: Three times a day (TID) | ORAL | 0 refills | Status: DC | PRN

## 2021-03-02 MED ORDER — TRIAMCINOLONE ACETONIDE 0.1 % EX CREA: 1.0000 "application " | TOPICAL_CREAM | Freq: Two times a day (BID) | CUTANEOUS | 3 refills | Status: DC

## 2021-03-02 NOTE — Telephone Encounter (Signed)
Patient had visit this afternoon.

## 2021-03-02 NOTE — Telephone Encounter (Signed)
Received message this am.   Dont have enough information  to answer and help.  Contact patient as requested .   She  is  on schedule fore this afternoon.

## 2021-03-02 NOTE — Patient Instructions (Addendum)
Not sure if medication caused the eczema flare because other factors present. But I think ok to stay on  for now.  I advise do the desmopressin trial as planned.  Stay hydrated  Add triamcinolone to the flared area   skin   not on face. So s  Will also   do allergy referral as discussed . Plan  fu if tolerated  in 2-3  months  virtual ok  for weight check

## 2021-03-02 NOTE — Progress Notes (Signed)
Chief Complaint  Patient presents with   Medication Reaction    One week and some days    HPI: Tiffany Roth 30 y.o. come in to discuss new medication and question if she was having side effects. She did see Dr. Moshe Cipro in regard to the question headache and sodium level of 149. She was given a prescription trial of desmopressin acetate after the Ozempic was prescribed.  Has not taken and wanted to see how things worked out but does have a follow-up with her next month or so  Took first dose  ozempic    it has helped appetite suppression.  She was pleased with this    and  not eating as much   portion size and then  noticed noted a flare of her eczema and itching mostly neck trunk some itching on her face but not all over and no edema.  It was not immediate and she has had her second shot and had a has not noticed any flare.  She uses Eucerin lotion or cream on her eczema has had severe case in the past saw Dr. Ike Bene in the past did call for an appointment but does not have one until July.  She is not using steroids or antihistamines Remote history of allergy testing years ago allergic to nickel on prick test  She decreases her dairy intake when her eczema flares.  Has been doing a a sauna times a week uses [employer] thinking about changing her detergent. ROS: See pertinent positives and negatives per HPI.  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, was on thyroid replacement as a child   Prematurity    "3 months" 1# 15 oz" ventilator    Sinus infection    recently diagnosed currently on antibiotic will have completed dosage prior to surgery    Sleep apnea    uses CPAP    Varicose veins    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    Family History  Problem  Relation Age of Onset   Thyroid disease Mother    Hypertension Mother     Social History   Socioeconomic History   Marital status: Single    Spouse name: Not on file   Number of children: Not on file   Years of education: Not on file   Highest education level: Not on file  Occupational History   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    Doing much better less stress   Going to grad school in august .  [place] in [employer] education .    Social Determinants of Health   Financial Resource Strain: Not on file  Food Insecurity: Not on file  Transportation Needs: Not on file  Physical  Activity: Not on file  Stress: Not on file  Social Connections: Not on file    Outpatient Medications Prior to Visit  Medication Sig Dispense Refill   desmopressin (DDAVP) 0.1 MG tablet Take 50 mcg by mouth 2 (two) times daily.     ferrous sulfate 325 (65 FE) MG tablet Take by mouth.     Semaglutide,0.25 or 0.5MG /DOS, (OZEMPIC, 0.25 OR 0.5 MG/DOSE,) 2 MG/1.5ML SOPN Inject 0.25 mg into the skin once a week. For 4 weeks, then increase to 0.5 mg per week 1.5 mL 1   fluticasone (FLONASE) 50 MCG/ACT nasal spray Place 1 spray into both nostrils 2 (two) times daily. 16 g 0   No facility-administered medications prior to visit.     EXAM:  BP 122/72 (BP Location: Left Arm, Patient Position: Sitting, Cuff Size: Normal)    Pulse (!) 102    Temp 98.7 F (37.1 C) (Oral)    Wt 257 lb 12.8 oz (116.9 kg)    SpO2 96%    BMI 48.59 kg/m   Body mass index is 48.59 kg/m. Wt Readings from Last 3  Encounters:  03/02/21 257 lb 12.8 oz (116.9 kg)  01/18/21 265 lb (120.2 kg)  10/03/18 240 lb (108.9 kg)    GENERAL: vitals reviewed and listed above, alert, oriented, appears well hydrated and in no acute distress HEENT: atraumatic, conjunctiva  clear, no obvious abnormalities on inspection of external nose and ears appears allergic facies but no congestion OP : masked  NECK: no obvious masses on inspection palpation  No respiratory distress skin mild hypopigmentation and thickened eczema area around neck and ulnar surface of hands anterior abdomen chest some blotchy areas.  No redness vesicles edema or hives noted. CV: HRRR, no clubbing cyanosis or nl cap refill  MS: moves all extremities without noticeable focal  abnormality PSYCH: pleasant and cooperative, no obvious depression or anxiety Lab Results  Component Value Date   WBC 6.4 01/27/2021   HGB 11.9 (L) 01/27/2021   HCT 37.4 01/27/2021   PLT 326.0 01/27/2021   GLUCOSE 79 01/27/2021   CHOL 133 01/27/2021   TRIG 82.0 01/27/2021   HDL 36.00 (L) 01/27/2021   LDLCALC 80 01/27/2021   ALT 15 01/27/2021   AST 22 01/27/2021   NA 146 (H) 01/27/2021   K 3.9 01/27/2021   CL 112 01/27/2021   CREATININE 1.23 (H) 01/27/2021   BUN 7 01/27/2021   CO2 26 01/27/2021   TSH 2.72 01/27/2021   HGBA1C 5.9 01/27/2021   BP Readings from Last 3 Encounters:  03/02/21 122/72  10/03/18 122/64  03/05/18 122/84    ASSESSMENT AND PLAN:  Discussed the following assessment and plan:  Morbid obesity ([place])  Eczema, unspecified type - Plan: Ambulatory referral to Allergy  Prediabetes  Diabetes insipidus ([place])  S/P laparoscopic sleeve gastrectomy  Medication management - Plan: Ambulatory referral to Allergy  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   Avoid dry heat reviewed the moisturization perhaps use a thicker emollient TMC cream given preferred over ointment for her personal to use 2-3 times a day not on face. At this point I think she can stay on the Ozempic and watch carefully perhaps spread shots out and keep [place] informed.  Plan follow-up in 2 to 3 months either way with weight med check etc. At this point  would avoid a dry sauna. Would follow-up with Dr. Moshe Cipro and do the medicine trial as she advised to see if helpful. Can try hydroxyzine if needed for itching with CNS precautions she had used Benadryl. We discussed allergy evaluation uncertain if helpful perhaps reevaluation we will do referral.  -Patient advised to return or notify health care team  if  new concerns arise. Additional information insurance company denied [place] as first try  Patient Instructions  Not sure if medication caused the eczema flare because other factors present. But I think ok to stay on  for now.  I advise do the desmopressin trial as planned.  Stay hydrated  Add triamcinolone to the flared area   skin   not on face. So s  Will also   do allergy referral as discussed . Plan  fu if tolerated  in 2-3  months  virtual ok  for weight check  Standley Brooking. Estelle Skibicki M.D.

## 2021-04-20 NOTE — Progress Notes (Signed)
NEW PATIENT Date of Service/Encounter:  04/21/21 Referring provider: Burnis Medin, MD Primary care provider: Burnis Medin, MD  Subjective:  Tiffany Roth is a 31 y.o. female with a PMHx of morbid obesity, prediabetes, diabetes insipidus, ,status post laparoscopic sleeve gastrectomy presenting today for evaluation of eczema. History obtained from: chart review and patient.   Atopic Dermatitis:  Diagnosed when she was a child, but feels it has worsened , flares mostly neck/face, behind knees, creases of her arms, elbows. Using Dove sensitive skin, Eucerin and Aquaphor 2-3 times per day Current regimen: Triamcinolone as needed-she doesn't like to use a lot because it is a steroid She has been on Elidel when she was younger.   She has tried Dupixent before but it broke her out terribly and she couldn't tolerate.   She was going to a Dermatology group, but her physician passed away.  She has an upcoming Dermatology appointment to reestablish care.  Reports use of fragrance/dye free products Identified triggers of flares include dairy and in general she would like to undergo extensive food allergy testing but does not have specific concerns except for dairy. Sleep is affected due to itching.  With dairy she will have an eczema flare but also throws up.   History of nickel allergy: develops contact dermatitis, has had previous patch testing many years ago  History of intolerance ibuprofen: she has diabetes insipidus so can not take due to kidney issues  Chronic rhinitis: not sure if allergic. She has had allergy testing many many years ago but it was many years ago. Uses Flonase as needed.  Would like to have testing.  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     cetirizine (ZYRTEC ALLERGY) 10 MG tablet Take 1 tablet (10 mg total) by mouth daily. 90 tablet 3   desmopressin (DDAVP) 0.1 MG tablet Take 0.1 mg by mouth daily.     ferrous sulfate 325 (65 FE) MG tablet Take by mouth.     Multiple Vitamin (MULTIVITAMIN ADULT PO) Take by mouth.     pimecrolimus (ELIDEL) 1 % cream Apply topically 2 (two) times daily. 30 g 0   fluticasone (FLONASE) 50 MCG/ACT nasal spray Place 1 spray into both nostrils 2 (two) times daily. 16 g 0   hydrOXYzine (VISTARIL) 25 MG capsule Take 1 capsule (25 mg total) by mouth every 8 (eight) hours as needed for itching. At night 30 capsule 0   Semaglutide,0.25 or 0.5MG /DOS, (OZEMPIC, 0.25 OR 0.5 MG/DOSE,) 2 MG/1.5ML SOPN Inject 0.25 mg into the skin once a week. For 4 weeks, then increase to 0.5 mg per week (Patient not taking: Reported on 04/21/2021) 1.5 mL 1   triamcinolone cream (  KENALOG) 0.1 % Apply 1 application topically 2 (two) times daily. (Patient not taking: Reported on 04/21/2021) 30 g 3   No current facility-administered medications for this visit.   Known Allergies:  Allergies  Allergen Reactions   Ibuprofen Other (See Comments)    DR told her not to take   Past Surgical History: Past Surgical History:  Procedure Laterality Date   LAPAROSCOPIC GASTRIC SLEEVE RESECTION N/A 06/02/2014   Procedure: LAPAROSCOPIC GASTRIC SLEEVE RESECTION;  Surgeon: Greer Pickerel, MD;  Location: [facility];  Service: General;  Laterality: N/A;   TYMPANOSTOMY TUBE PLACEMENT     UPPER GI  ENDOSCOPY N/A 06/02/2014   Procedure: UPPER GI ENDOSCOPY;  Surgeon: Greer Pickerel, MD;  Location: [facility];  Service: General;  Laterality: N/A;   varicose veins      surgically repaired 2011   Family History: Family History  Problem Relation Age of Onset   Thyroid disease Mother    Hypertension Mother    Social History: Tiffany Roth lives in a house built 10-15 years ago, carpet floors, pet dog, no cockroaches, not using plastic on bedding. No smoke exposure. She is 3rd grade teacher.  Working on [employer] in education.   ROS:  All other systems negative except as noted per HPI.  Objective:  Blood pressure 122/70, pulse 94, temperature 98.1 F (36.7 C), resp. rate 16, height 5\' 2"  (1.575 m), weight 259 lb 4 oz (117.6 kg), SpO2 99 %. Body mass index is 47.42 kg/m. Physical Exam:  General Appearance:  Alert, cooperative, no distress, appears stated age  Head:  Normocephalic, without obvious abnormality, atraumatic  Eyes:  Conjunctiva clear, EOM's intact  Nose: Nares normal, hypertrophic turbinates, normal mucosa, no visible anterior polyps, and septum midline  Throat: Lips, tongue normal; teeth and gums normal, normal posterior oropharynx  Neck: Supple, symmetrical  Lungs:   clear to auscultation bilaterally, Respirations unlabored, no coughing  Heart:  regular rate and rhythm and no murmur, Appears well perfused  Extremities: No edema  Skin: Erythematous lichenified dry patches on face, trunk, extremities, multiple sites of excoriation, no signs of superinfection  Neurologic: No gross deficits     Diagnostics: Skin Testing:  Deferred due to eczematous patches, no clear skin .   Assessment and Plan  Discussed the multifactorial etiology of eczema and the role within that of allergies.  Allergies can flare eczema but many other nonallergic factors also are at play. Food elimination diets in the setting of eczema flares are rarely used except in rare situations because treating the  skin and healing the skin barrier is a safer and less restrictive way to manage.  Additionally, individuals with eczema often have high total IgE levels and tend to produce more false positives on lab testing-leading to unnecessary restrictive diets.  Also, a food that is noted to be a false positive (meaning you can eat and tolerate the food, but your testing is positive) can convert to a true food allergy if avoid for long periods of time. All of this was explained in detail to the patient. Patient has fear of prolonged steroid use due to adverse effects of steroids.  She has failed Dupixent.  We discussed other therapy options.  Will start with TCI but also discussed topical JAKs if this fails.  Could even consider oral JAK in her. She would also like to explore dietary options.  Discussed that we can look at her levels with lab work, but would not advise strict avoidance of any positives with exception  of dairy which also causes emesis.  If she wants to use any positives as guidance for food avoidance, she can, but it is unclear if that will be effective at minimizing her eczema symptoms.  She expressed understanding of this information and would like to move forward with testing.   Will also check environmental allergens via blood testing.   Patient Instructions  Concern for dairy allergy:  - please strictly avoid dairy - labs today for dairy  - for SKIN only reaction, okay to take Benadryl 2 capsules every 4 hours - for SKIN + ANY additional symptoms, OR IF concern for LIFE THREATENING reaction = Epipen Autoinjector EpiPen 0.3 mg. - If using Epinephrine autoinjector, call 911 - A food allergy action plan has been provided and discussed. - Medic Alert identification is recommended.  Atopic Dermatitis (eczema):  Severe eczema;  Hx-tried and failed Dupixent (didn't tolerate) - Prefers minimal topical steroids due to side effects Daily Care For Maintenance (daily and continue even once eczema  controlled) - Use hypoallergenic hydrating ointment at least twice daily.  This must be done daily for control of flares. (Great options include Vaseline, CeraVe, Aquaphor, Aveeno, Cetaphil, VaniCream, etc) - Avoid detergents, soaps or lotions with fragrances/dyes - Limit showers/baths to 5 minutes and use luke warm water instead of hot, pat dry following baths, and apply moisturizer - can use steroid/non-steroid therapy creams as detailed below up to twice weekly for prevention of flares. - checking for multiple food sensitivities to use as a guide for food avoidance given severity of eczema, failure of Dupixent - discussed in detail possibility of false positives, multifactorial nature of eczema, and this should only be used as a guide for food avoidance to see if eczema improves with avoidance (see below food elimination instructions to be used once testing returns)  For Flares:(add this to maintenance therapy if needed for flares) First apply steroid/non-steroid treatment creams. Wait 5 minutes then apply moisturizer.  - Triamcinolone 0.1% to body for moderate flares-apply topically twice daily to red, raised areas of skin, followed by moisturizer - Hydrocortisone 2.5% to face-apply topically twice daily to red, raised areas of skin, followed by moisturizer - Non-steroid treatment options: Elidel 1% apply topically twice daily as needed (can use in place of steroid creams if desires, can use on face) - Hydroxyzine 25 mg nightly as needed for itching  Zyrtec 10 mg daily (non-drowsy) for itching  Food Elimination Diet Instructions - keep a food diary at least 2-4 weeks - make note if any foods consistently make symptoms worse  - if so, eliminate these foods from your diet for 2-4 weeks.   - If symptoms do not improve, stop food elimination diet.   - If symptoms do improve, then one by one reintroduce foods and make note if symptoms return.   - If they return following reintroduction, consider  eliminating or reducing intake of each specific food causing symptoms.    Follow-up in 3 months, sooner if needed.  It was a pleasure meeting you in clinic today!  Good luck with your dissertation Dr. Rosita Fire!   Sigurd Sos, MD [facility]      This note in its entirety was forwarded to the Provider who requested this consultation.  Thank you for your kind referral. I appreciate the opportunity to take part in Mayling's care. Please do not hesitate to contact me with questions.  Sincerely,  Sigurd Sos, MD [facility]

## 2021-04-21 ENCOUNTER — Other Ambulatory Visit: Payer: Self-pay

## 2021-04-21 ENCOUNTER — Encounter: Payer: Self-pay | Admitting: Internal Medicine

## 2021-04-21 ENCOUNTER — Ambulatory Visit: Payer: BC Managed Care – PPO | Admitting: Internal Medicine

## 2021-04-21 VITALS — BP 122/70 | HR 94 | Temp 98.1°F | Resp 16 | Ht 62.0 in | Wt 259.2 lb

## 2021-04-21 DIAGNOSIS — L309 Dermatitis, unspecified: Secondary | ICD-10-CM

## 2021-04-21 DIAGNOSIS — J310 Chronic rhinitis: Secondary | ICD-10-CM | POA: Diagnosis not present

## 2021-04-21 DIAGNOSIS — T781XXA Other adverse food reactions, not elsewhere classified, initial encounter: Secondary | ICD-10-CM

## 2021-04-21 MED ORDER — CETIRIZINE HCL 10 MG PO TABS: 10.0000 mg | ORAL_TABLET | Freq: Every day | ORAL | 3 refills | Status: DC

## 2021-04-21 MED ORDER — PIMECROLIMUS 1 % EX CREA: TOPICAL_CREAM | Freq: Two times a day (BID) | CUTANEOUS | 0 refills | Status: DC

## 2021-04-21 MED ORDER — TRIAMCINOLONE ACETONIDE 0.1 % EX CREA: 1.0000 "application " | TOPICAL_CREAM | Freq: Two times a day (BID) | CUTANEOUS | 3 refills | Status: DC | PRN

## 2021-04-21 MED ORDER — EPINEPHRINE 0.3 MG/0.3ML IJ SOAJ: 0.3000 mg | Freq: Once | INTRAMUSCULAR | 2 refills | Status: AC

## 2021-04-21 MED ORDER — HYDROCORTISONE 2.5 % EX OINT: TOPICAL_OINTMENT | CUTANEOUS | 0 refills | Status: DC

## 2021-04-21 MED ORDER — FLUTICASONE PROPIONATE 50 MCG/ACT NA SUSP: 1.0000 | Freq: Two times a day (BID) | NASAL | 0 refills | Status: DC

## 2021-04-21 MED ORDER — HYDROXYZINE PAMOATE 25 MG PO CAPS: 25.0000 mg | ORAL_CAPSULE | Freq: Three times a day (TID) | ORAL | 0 refills | Status: DC | PRN

## 2021-04-21 NOTE — Patient Instructions (Signed)
Concern for dairy allergy:  - please strictly avoid dairy - labs today for dairy  - for SKIN only reaction, okay to take Benadryl 2 capsules every 4 hours - for SKIN + ANY additional symptoms, OR IF concern for LIFE THREATENING reaction = Epipen Autoinjector EpiPen 0.3 mg. - If using Epinephrine autoinjector, call 911 - A food allergy action plan has been provided and discussed. - Medic Alert identification is recommended.  Atopic Dermatitis (eczema):  Severe eczema;  Hx-tried and failed Dupixent (didn't tolerate) - Prefers minimal topical steroids due to side effects Daily Care For Maintenance (daily and continue even once eczema controlled) - Use hypoallergenic hydrating ointment at least twice daily.  This must be done daily for control of flares. (Great options include Vaseline, CeraVe, Aquaphor, Aveeno, Cetaphil, VaniCream, etc) - Avoid detergents, soaps or lotions with fragrances/dyes - Limit showers/baths to 5 minutes and use luke warm water instead of hot, pat dry following baths, and apply moisturizer - can use steroid/non-steroid therapy creams as detailed below up to twice weekly for prevention of flares. - checking for multiple food sensitivities to use as a guide for food avoidance given severity of eczema, failure of Dupixent - discussed in detail possibility of false positives, multifactorial nature of eczema, and this should only be used as a guide for food avoidance to see if eczema improves with avoidance (see below food elimination instructions to be used once testing returns)  For Flares:(add this to maintenance therapy if needed for flares) First apply steroid/non-steroid treatment creams. Wait 5 minutes then apply moisturizer.  - Triamcinolone 0.1% to body for moderate flares-apply topically twice daily to red, raised areas of skin, followed by moisturizer - Hydrocortisone 2.5% to face-apply topically twice daily to red, raised areas of skin, followed by moisturizer -  Non-steroid treatment options: Elidel 1% apply topically twice daily as needed (can use in place of steroid creams if desires, can use on face) - Hydroxyzine 25 mg nightly as needed for itching  Zyrtec 10 mg daily (non-drowsy) for itching  Food Elimination Diet Instructions - keep a food diary at least 2-4 weeks - make note if any foods consistently make symptoms worse  - if so, eliminate these foods from your diet for 2-4 weeks.   - If symptoms do not improve, stop food elimination diet.   - If symptoms do improve, then one by one reintroduce foods and make note if symptoms return.   - If they return following reintroduction, consider eliminating or reducing intake of each specific food causing symptoms.    Follow-up in 3 months, sooner if needed.  It was a pleasure meeting you in clinic today!  Good luck with your dissertation Dr. Rosita Fire!   Sigurd Sos, MD [facility]

## 2021-04-22 ENCOUNTER — Telehealth: Payer: Self-pay | Admitting: *Deleted

## 2021-04-22 NOTE — Telephone Encounter (Signed)
PA has been approved for Pimecrolimus. PA has been faxed to patients pharmacy, labeled, and placed in bulk scanning.

## 2021-04-22 NOTE — Telephone Encounter (Signed)
PA has been submitted through CoverMyMeds for Pimecrolimus and is currently pending approval/denial.

## 2021-04-26 LAB — IGE+ALLERGENS ZONE 2(30)
Alternaria Alternata IgE: <0.1 kU/L
Amer Sycamore IgE Qn: <0.1 kU/L
Aspergillus Fumigatus IgE: <0.1 kU/L
Bahia Grass IgE: <0.1 kU/L
Bermuda Grass IgE: <0.1 kU/L
Cat Dander IgE: <0.1 kU/L
Cedar, Mountain IgE: <0.1 kU/L
Cladosporium Herbarum IgE: <0.1 kU/L
Cockroach, American IgE: <0.1 kU/L
Common Silver Birch IgE: <0.1 kU/L
D Farinae IgE: <0.1 kU/L
D Pteronyssinus IgE: <0.1 kU/L
Dog Dander IgE: <0.1 kU/L
Elm, American IgE: <0.1 kU/L
Hickory, White IgE: <0.1 kU/L
IgE (Immunoglobulin E), Serum: 177 IU/mL (ref 6–495)
Johnson Grass IgE: <0.1 kU/L
Maple/Box Elder IgE: <0.1 kU/L
Mucor Racemosus IgE: <0.1 kU/L
Mugwort IgE Qn: <0.1 kU/L
Nettle IgE: <0.1 kU/L
Oak, White IgE: <0.1 kU/L
Penicillium Chrysogen IgE: 0.23 kU/L — AB
Pigweed, Rough IgE: <0.1 kU/L
Plantain, English IgE: <0.1 kU/L
Ragweed, Short IgE: <0.1 kU/L
Sheep Sorrel IgE Qn: <0.1 kU/L
Stemphylium Herbarum IgE: 0.12 kU/L — AB
Sweet gum IgE RAST Ql: <0.1 kU/L
Timothy Grass IgE: <0.1 kU/L
White Mulberry IgE: <0.1 kU/L

## 2021-04-26 LAB — ALLERGENS(10) FOODS
Allergen Corn, IgE: <0.1 kU/L
Egg, Whole IgE: <0.1 kU/L
F045-IgE Yeast: 0.2 kU/L — AB
F081-IgE Cheese, Cheddar Type: <0.1 kU/L
F082-IgE Cheese, Mold Type: <0.1 kU/L
Malt: <0.1 kU/L
Milk IgE: <0.1 kU/L
Peanut IgE: <0.1 kU/L
Soybean IgE: <0.1 kU/L
Wheat IgE: <0.1 kU/L

## 2021-04-26 LAB — ALLERGEN PROFILE, VEGETABLE I
Allergen Broccoli: <0.1 kU/L
Allergen Cabbage IgE: <0.1 kU/L
Allergen Cauliflower IgE: <0.1 kU/L
Allergen Celery IgE: <0.1 kU/L
Allergen Cucumber IgE: <0.1 kU/L
Allergen Lettuce IgE: <0.1 kU/L
F214-IgE Spinach: <0.1 kU/L

## 2021-04-26 LAB — ALLERGEN PROFILE, FOOD-FRUIT
Allergen Apple, IgE: <0.1 kU/L
Allergen Banana IgE: <0.1 kU/L
Allergen Grape IgE: <0.1 kU/L
Allergen Pear IgE: <0.1 kU/L
Allergen, Peach f95: <0.1 kU/L

## 2021-04-26 LAB — ALLERGEN PROFILE, SHELLFISH
Clam IgE: <0.1 kU/L
F023-IgE Crab: <0.1 kU/L
F080-IgE Lobster: <0.1 kU/L
F290-IgE Oyster: <0.1 kU/L
Scallop IgE: <0.1 kU/L
Shrimp IgE: 0.29 kU/L — AB

## 2021-04-26 LAB — ALLERGEN PROFILE, FOOD-MEAT
Beef IgE: <0.1 kU/L
Chicken IgE: <0.1 kU/L
Pork IgE: <0.1 kU/L

## 2021-04-26 NOTE — Progress Notes (Signed)
Please let Tiffany Roth that her allergy testing has returned and is negative.  Positive is considered anything greater than 0.34. As discussed, many things can cause eczema to flare and allergies are only a small subset of that.  It appears that in her case likely allergies or not a major contributor. No changes to current eczema plan.  We can discuss other therapy options at next visit if needed.  Since dairy has been a known eczema trigger for you in the past and it causes you to vomit, I would suggest ongoing avoidance at this point.  Please let me know if she has any questions

## 2021-05-13 ENCOUNTER — Other Ambulatory Visit: Payer: Self-pay | Admitting: Internal Medicine

## 2021-07-16 ENCOUNTER — Encounter (HOSPITAL_BASED_OUTPATIENT_CLINIC_OR_DEPARTMENT_OTHER): Payer: Self-pay

## 2021-07-16 ENCOUNTER — Ambulatory Visit (HOSPITAL_BASED_OUTPATIENT_CLINIC_OR_DEPARTMENT_OTHER): Admit: 2021-07-16 | Payer: BC Managed Care – PPO | Admitting: Obstetrics and Gynecology

## 2021-07-16 SURGERY — MYOMECTOMY, ABDOMINAL APPROACH
Anesthesia: General

## 2021-07-21 ENCOUNTER — Ambulatory Visit: Payer: BC Managed Care – PPO | Admitting: Internal Medicine

## 2021-07-27 ENCOUNTER — Ambulatory Visit: Payer: BC Managed Care – PPO | Admitting: Family Medicine

## 2021-07-27 ENCOUNTER — Telehealth: Payer: Self-pay | Admitting: Internal Medicine

## 2021-07-27 ENCOUNTER — Encounter: Payer: Self-pay | Admitting: Family Medicine

## 2021-07-27 VITALS — BP 120/70 | HR 90 | Temp 98.5°F | Resp 12 | Ht 62.0 in | Wt 266.1 lb

## 2021-07-27 DIAGNOSIS — L298 Other pruritus: Secondary | ICD-10-CM

## 2021-07-27 DIAGNOSIS — L03221 Cellulitis of neck: Secondary | ICD-10-CM

## 2021-07-27 DIAGNOSIS — L218 Other seborrheic dermatitis: Secondary | ICD-10-CM

## 2021-07-27 MED ORDER — DOXYCYCLINE HYCLATE 100 MG PO TABS: 100.0000 mg | ORAL_TABLET | Freq: Two times a day (BID) | ORAL | 0 refills | Status: AC

## 2021-07-27 MED ORDER — CLOTRIMAZOLE-BETAMETHASONE 1-0.05 % EX CREA: 1.0000 "application " | TOPICAL_CREAM | Freq: Every day | CUTANEOUS | 0 refills | Status: DC

## 2021-07-27 MED ORDER — PREDNISONE 20 MG PO TABS: ORAL_TABLET | ORAL | 0 refills | Status: AC

## 2021-07-27 MED ORDER — METHYLPREDNISOLONE ACETATE 40 MG/ML IJ SUSP
40.0000 mg | Freq: Once | INTRAMUSCULAR | Status: AC
  Administered 2021-07-27: 40 mg via INTRAMUSCULAR

## 2021-07-27 MED ORDER — KETOCONAZOLE 2 % EX SHAM: 1.0000 "application " | MEDICATED_SHAMPOO | CUTANEOUS | 0 refills | Status: DC

## 2021-07-27 NOTE — Telephone Encounter (Signed)
Okay with Dr. [place], they discussed it during the visit.

## 2021-07-27 NOTE — Patient Instructions (Addendum)
A few things to remember from today's visit:  Pruritic erythematous rash  Acute generalized seborrheic dermatitis - Plan: predniSONE (DELTASONE) 20 MG tablet, clotrimazole-betamethasone (LOTRISONE) cream, ketoconazole (NIZORAL) 2 % shampoo  Cellulitis, neck - Plan: doxycycline (VIBRA-TABS) 100 MG tablet  Avoid scratching. Keep finger nails clean. Doxycycline can make you more sensitive to sun, so avoid exposure.  If you need refills please call your pharmacy. Do not use My Chart to request refills or for acute issues that need immediate attention.  Seborrheic Dermatitis, Adult Seborrheic dermatitis is a skin disease that causes red, scaly patches. It usually occurs on the scalp, and it is often called dandruff. The patches may appear on other parts of the body. Skin patches tend to appear where there are many oil glands in the skin. Areas of the body that are commonly affected include the: Scalp. Ears. Eyebrows. Face. Bearded area of [employer]. Skin folds of the body, such as the armpits, groin, and buttocks. Chest. The condition may come and go for no known reason, and it is often long-lasting (chronic). What are the causes? The cause of this condition is not known. What increases the risk? The following factors may make you more likely to develop this condition: Having certain conditions, such as: HIV (human immunodeficiency virus). AIDS (acquired immunodeficiency syndrome). Parkinson's disease. Mood disorders, such as depression. Being 36-44 years old. What are the signs or symptoms? Symptoms of this condition include: Thick scales on the scalp. Redness on the face or in the armpits. Skin that is flaky. The flakes may be white or yellow. Skin that seems oily or dry but is not helped with moisturizers. Itching or burning in the affected areas. How is this diagnosed? This condition is diagnosed with a medical history and physical exam. A sample of your skin may be tested  (skin biopsy). You may need to see a skin specialist (dermatologist). How is this treated? There is no cure for this condition, but treatment can help to manage the symptoms. You may get treatment to remove scales, lower the risk of skin infection, and reduce swelling or itching. Treatment may include: Creams that reduce skin yeast. Medicated shampoo. Moisturizing creams or ointments. Creams that reduce swelling and irritation (steroids). Follow these instructions at home: Apply over-the-counter and prescription medicines only as told by your health care provider. Use any medicated shampoo, skin creams, or ointments only as told by your health care provider. Keep all follow-up visits as told by your health care provider. This is important. Contact a health care provider if: Your symptoms do not improve with treatment. Your symptoms get worse. You have new symptoms. Get help right away if: Your condition rapidly worsens with treatment. Summary Seborrheic dermatitis is a skin disease that causes red, scaly patches. Seborrheic dermatitis commonly affects the scalp, face, and skin folds. There is no cure for this condition, but treatment can help to manage the symptoms. This information is not intended to replace advice given to you by your health care provider. Make sure you discuss any questions you have with your health care provider. Document Revised: 11/29/2018 Document Reviewed: 11/29/2018 Elsevier Patient Education  [place].   Please be sure medication list is accurate. If a new problem present, please set up appointment sooner than planned today.

## 2021-07-27 NOTE — Telephone Encounter (Deleted)
She needs to schedule a 30 min office visit  with PCP

## 2021-07-27 NOTE — Telephone Encounter (Signed)
Please advise 

## 2021-07-27 NOTE — Telephone Encounter (Signed)
Patient would like to switch from Dr. Regis Bill to Dr. Martinique. Please advise.  (IC)

## 2021-07-27 NOTE — Progress Notes (Signed)
ACUTE VISIT Chief Complaint  Patient presents with   Rash    Had a facial on Sunday, gets these all the time. Very itchy & dry, does have a history of eczema.    HPI: Ms.Avangeline N Lievanos is a 31 y.o. female, who is here today complaining of 2-3 weeks of very pruritic skin rash as described above. Initially on face and did spread to area around ears and neck. Hx of eczema but this is not her typical rash, it usually affects UE's.  Rash This is a recurrent problem. The current episode started 1 to 4 weeks ago. The problem has been gradually worsening since onset. The affected locations include the face, neck, left ear and right ear. The rash is characterized by dryness. Pertinent negatives include no congestion, cough, diarrhea, eye pain, facial edema, fatigue, fever, joint pain, nail changes, rhinorrhea, shortness of breath, sore throat or vomiting. Past treatments include nothing. Her past medical history is significant for allergies and eczema.  Negative for new medication, detergent, soap, or body product. No known insect bite or outdoor exposures to plants. No sick contact.  Review of Systems  Constitutional:  Negative for activity change, appetite change, fatigue and fever.  HENT:  Negative for congestion, rhinorrhea and sore throat.   Eyes:  Negative for pain.  Respiratory:  Negative for cough, shortness of breath and wheezing.   Cardiovascular:  Negative for leg swelling.  Gastrointestinal:  Negative for diarrhea and vomiting.  Genitourinary:  Negative for decreased urine volume and hematuria.  Musculoskeletal:  Negative for arthralgias, joint pain, joint swelling and myalgias.  Skin:  Positive for rash. Negative for nail changes.  Rest see pertinent positives and negatives per HPI.  Current Outpatient Medications on File Prior to Visit  Medication Sig Dispense Refill   CALCIUM PO Take by mouth.     cetirizine (ZYRTEC ALLERGY) 10 MG tablet Take 1 tablet (10 mg total) by  mouth daily. 90 tablet 3   desmopressin (DDAVP) 0.1 MG tablet Take 0.1 mg by mouth daily.     ferrous sulfate 325 (65 FE) MG tablet Take by mouth.     hydrocortisone 2.5 % ointment Apply topically twice daily as need to red sandpapery rash. 30 g 0   hydrOXYzine (VISTARIL) 25 MG capsule Take 1 capsule (25 mg total) by mouth every 8 (eight) hours as needed for itching. At night 30 capsule 0   Multiple Vitamin (MULTIVITAMIN ADULT PO) Take by mouth.     pimecrolimus (ELIDEL) 1 % cream Apply topically 2 (two) times daily. 30 g 0   Semaglutide,0.25 or 0.'5MG'$ /DOS, (OZEMPIC, 0.25 OR 0.5 MG/DOSE,) 2 MG/1.5ML SOPN Inject 0.25 mg into the skin once a week. For 4 weeks, then increase to 0.5 mg per week 1.5 mL 1   triamcinolone cream (KENALOG) 0.1 % Apply 1 application topically 2 (two) times daily as needed. Do not apply to face, groin or armpits 453 g 3   fluticasone (FLONASE) 50 MCG/ACT nasal spray PLACE 1 SPRAY INTO BOTH NOSTRILS 2 (TWO) TIMES DAILY 16 mL 3   No current facility-administered medications on file prior to visit.   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, was on thyroid replacement as a child  Prematurity    "3 months" 1# 15 oz" ventilator    Sinus infection    recently diagnosed currently on antibiotic will have completed dosage prior to surgery    Sleep apnea    uses CPAP    Varicose veins    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   Allergies  Allergen Reactions   Ibuprofen Other (See Comments)    DR told her not to take   Social History   Socioeconomic History   Marital status: Single    Spouse name: Not on file   Number of children: Not on file   Years of education: Not on file   Highest education level: Not on file  Occupational History   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    Doing much better less stress   Going to grad school in august .  [place] in [employer] education .    Social Determinants of Health   Financial Resource Strain: Not on file  Food Insecurity: Not on file  Transportation Needs: Not on file  Physical Activity: Not on file  Stress: Not on file  Social Connections: Not on file   Vitals:   07/27/21 1505  BP: 120/70  Pulse: 90  Resp: 12  Temp: 98.5 F (36.9 C)  SpO2: 99%   Body mass index is 48.67 kg/m.  Physical Exam Vitals and nursing note reviewed.  Constitutional:      General: She is not in acute distress.    Appearance: She is well-developed.  HENT:     Head: Normocephalic and atraumatic.     Mouth/Throat:     Mouth: Mucous membranes are moist.     Pharynx: Oropharynx is clear.  Eyes:     Conjunctiva/sclera: Conjunctivae normal.  Cardiovascular:     Rate and Rhythm: Normal rate and regular rhythm.  Pulmonary:     Effort: Pulmonary effort is normal. No respiratory distress.     Breath sounds: Normal breath sounds.  Musculoskeletal:     Cervical back: Erythema present. No edema. Pain with movement present.  Lymphadenopathy:     Cervical: No cervical adenopathy.  Skin:    General: Skin is warm.     Findings: Rash present.     Comments: Mild edema and tenderness upon palpation of lateral aspect of neck and mandibular area.Scratching marks around ears. Scaly and erythematous patches on eye brows and scalp. Also lesions scatted around shoulders and upper chest.  Neurological:     Mental Status:  She is alert and oriented to person, place, and time.   ASSESSMENT AND PLAN:  Ms.Kadedra was seen today for rash.  Diagnoses and all orders for this visit:  Pruritic erythematous rash We discussed possible etiologies. Here in the office and after oral consent she received depo medrol 40 mg IM x 1. Tomorrow she was instructed to start Prednisone taper, some side effects discussed.  -     methylPREDNISolone acetate (DEPO-MEDROL) injection 40 mg  Acute generalized seborrheic dermatitis Distribution is suggestive  of seborrheic dermatitis. Prednisone taper  and topical Lotrisone small amount bid x 14 d then prn. Nizoral shampoo daily on scalp and around ears/eye brows until resolved then 2 times per week.   -     predniSONE (DELTASONE) 20 MG tablet; 2 tabs for 5 days, 1 tabs for 4 days, and 1/2 tab for 3 days. Take tables together with breakfast. -     clotrimazole-betamethasone (LOTRISONE) cream; Apply 1 application. topically daily. -     ketoconazole (NIZORAL) 2 % shampoo; Apply 1 application. topically 2 (two) times a week.  Cellulitis, neck Some areas around neck and ears tender and edematous. Instructed to avoid scratching, keep finger nails short and clean. Doxycycline 100 mg bid x 7 d. Instructed about warning signs. F/U in 2 weeks with PCP>  -     doxycycline (VIBRA-TABS) 100 MG tablet; Take 1 tablet (100 mg total) by mouth 2 (two) times daily for 7 days.  Return in about 2 weeks (around 08/10/2021) for skin rash PCP.  Shauntea Lok G. Martinique, MD  [facility]. [place] office.

## 2021-07-28 NOTE — Telephone Encounter (Signed)
Ok with me 

## 2021-07-29 NOTE — Telephone Encounter (Signed)
Called Patient to inform her of TOC agreement between both doctors. Patient state she will call back the 3rd week of June to make a [facility] appointment.  (IC)

## 2021-08-03 ENCOUNTER — Other Ambulatory Visit: Payer: Self-pay | Admitting: Obstetrics and Gynecology

## 2021-08-24 NOTE — Progress Notes (Deleted)
FOLLOW UP Date of Service/Encounter:  08/24/21   Subjective:  Tiffany Roth (DOB: 1990/07/20) is a 31 y.o. female PMHx of morbid obesity, prediabetes, diabetes insipidus, ,status post laparoscopic sleeve gastrectomy who returns to the [facility] on 08/25/2021 in re-evaluation of the following: eczema History obtained from: chart review and {Persons; PED relatives w/patient:19415::"patient"}.  For Review, LV was on 04/21/21  with Dr.Deserie Dirks seen for intial visit for eczema and concern for food triggers .  We ordered extensive food panel per patient request.  Pertinent history:  Eczema: diagnosed in childhood, worsened with age, flares mostly neck/face, behind knees, creases of her arms, elbows. She has tried Dupixent before but it broke her out terribly and she couldn't tolerate.  With dairy she will have an eczema flare but also throws up.  - vegetable panel negative, shellfish panel, poirk, beef, chicken, fruit panel, egg, milk, wheat, corn, peanut, soy, yeast, cheese, malt all negative.   - environmental panel negative  Today presents for follow-up. ***  Allergies as of 08/25/2021       Reactions   Ibuprofen Other (See Comments)   DR told her not to take        Medication List        Accurate as of August 24, 2021 11:30 AM. If you have any questions, ask your nurse or doctor.          CALCIUM PO Take by mouth.   cetirizine 10 MG tablet Commonly known as: ZyrTEC Allergy Take 1 tablet (10 mg total) by mouth daily.   clotrimazole-betamethasone cream Commonly known as: LOTRISONE Apply 1 application. topically daily.   desmopressin 0.1 MG tablet Commonly known as: DDAVP Take 0.1 mg by mouth daily.   ferrous sulfate 325 (65 FE) MG tablet Take by mouth.   fluticasone 50 MCG/ACT nasal spray Commonly known as: FLONASE PLACE 1 SPRAY INTO BOTH NOSTRILS 2 (TWO) TIMES DAILY   hydrocortisone 2.5 % ointment Apply topically twice daily as need to red  sandpapery rash.   hydrOXYzine 25 MG capsule Commonly known as: VISTARIL Take 1 capsule (25 mg total) by mouth every 8 (eight) hours as needed for itching. At night   ketoconazole 2 % shampoo Commonly known as: Nizoral Apply 1 application. topically 2 (two) times a week.   MULTIVITAMIN ADULT PO Take by mouth.   Ozempic (0.25 or 0.5 MG/DOSE) 2 MG/1.5ML Sopn Generic drug: Semaglutide(0.25 or 0.'5MG'$ /DOS) Inject 0.25 mg into the skin once a week. For 4 weeks, then increase to 0.5 mg per week   pimecrolimus 1 % cream Commonly known as: Elidel Apply topically 2 (two) times daily.   triamcinolone cream 0.1 % Commonly known as: KENALOG Apply 1 application topically 2 (two) times daily as needed. 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, was on thyroid replacement as a child   Prematurity    "3 months" 1# 15 oz" ventilator    Sinus infection    recently diagnosed currently on antibiotic will have completed dosage prior to surgery    Sleep apnea    uses CPAP    Varicose veins    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  Past Surgical History:  Procedure Laterality Date   LAPAROSCOPIC GASTRIC SLEEVE RESECTION N/A 06/02/2014   Procedure: LAPAROSCOPIC GASTRIC SLEEVE RESECTION;  Surgeon: Greer Pickerel, MD;  Location: [facility];  Service: General;  Laterality: N/A;   TYMPANOSTOMY TUBE PLACEMENT     UPPER GI ENDOSCOPY N/A 06/02/2014   Procedure: UPPER GI ENDOSCOPY;  Surgeon: Greer Pickerel, MD;  Location: [facility];  Service: General;  Laterality: N/A;   varicose veins      surgically repaired 2011   Otherwise, there have been no changes to her past medical history, surgical history, family history, or social history.  ROS: All others negative  except as noted per HPI.   Objective:  There were no vitals taken for this visit. There is no height or weight on file to calculate BMI. Physical Exam: General Appearance:  Alert, cooperative, no distress, appears stated age  Head:  Normocephalic, without obvious abnormality, atraumatic  Eyes:  Conjunctiva clear, EOM's intact  Nose: Nares normal, {Blank multiple:19196:a:"***","hypertrophic turbinates","normal mucosa","no visible anterior polyps","septum midline"}  Throat: Lips, tongue normal; teeth and gums normal, {Blank multiple:19196:a:"***","normal posterior oropharynx","tonsils 2+","tonsils 3+","no tonsillar exudate","+ cobblestoning"}  Neck: Supple, symmetrical  Lungs:   {Blank multiple:19196:a:"***","clear to auscultation bilaterally","end-expiratory wheezing","wheezing throughout"}, Respirations unlabored, {Blank multiple:19196:a:"***","no coughing","intermittent dry coughing"}  Heart:  {Blank multiple:19196:a:"***","regular rate and rhythm","no murmur"}, Appears well perfused  Extremities: No edema  Skin: Skin color, texture, turgor normal, no rashes or lesions on visualized portions of skin  Neurologic: No gross deficits   Reviewed: ***  Spirometry:  Tracings reviewed. Her effort: {Blank single:19197::"Good reproducible efforts.","It was hard to get consistent efforts and there is a question as to whether this reflects a maximal maneuver.","Poor effort, data can not be interpreted.","Variable effort-results affected.","decent for first attempt at spirometry."} FVC: ***L FEV1: ***L, ***% predicted FEV1/FVC ratio: ***% Interpretation: {Blank single:19197::"Spirometry consistent with mild obstructive disease","Spirometry consistent with moderate obstructive disease","Spirometry consistent with severe obstructive disease","Spirometry consistent with possible restrictive disease","Spirometry consistent with mixed obstructive and restrictive disease","Spirometry uninterpretable due to  technique","Spirometry consistent with normal pattern","No overt abnormalities noted given today's efforts"}.  Please see scanned spirometry results for details.  Skin Testing: {Blank single:19197::"Select foods","Environmental allergy panel","Environmental allergy panel and select foods","Food allergy panel","None","Deferred due to recent antihistamines use","deferred due to recent reaction"}. ***Adequate positive and negative controls Results discussed with patient/family.   {Blank single:19197::"Allergy testing results were read and interpreted by myself, documented by clinical staff."," "}  Assessment/Plan   ***  Sigurd Sos, MD  [facility] [place]

## 2021-08-25 ENCOUNTER — Ambulatory Visit: Payer: Self-pay | Admitting: Internal Medicine

## 2021-08-25 DIAGNOSIS — J309 Allergic rhinitis, unspecified: Secondary | ICD-10-CM

## 2021-08-30 ENCOUNTER — Other Ambulatory Visit: Payer: Self-pay

## 2021-08-30 ENCOUNTER — Encounter (HOSPITAL_COMMUNITY)
Admission: RE | Admit: 2021-08-30 | Discharge: 2021-08-30 | Disposition: A | Discharge status: Home / Self Care | Payer: BC Managed Care – PPO | Source: Ambulatory Visit | Attending: Obstetrics and Gynecology | Admitting: Obstetrics and Gynecology

## 2021-08-30 ENCOUNTER — Encounter (HOSPITAL_COMMUNITY): Payer: Self-pay

## 2021-08-30 VITALS — BP 125/76 | HR 85 | Temp 98.2°F | Resp 17 | Ht 61.0 in | Wt 260.0 lb

## 2021-08-30 DIAGNOSIS — E232 Diabetes insipidus: Secondary | ICD-10-CM | POA: Insufficient documentation

## 2021-08-30 DIAGNOSIS — Z9884 Bariatric surgery status: Secondary | ICD-10-CM | POA: Diagnosis not present

## 2021-08-30 DIAGNOSIS — Z01812 Encounter for preprocedural laboratory examination: Secondary | ICD-10-CM | POA: Diagnosis present

## 2021-08-30 DIAGNOSIS — Z8616 Personal history of COVID-19: Secondary | ICD-10-CM | POA: Insufficient documentation

## 2021-08-30 DIAGNOSIS — Z01818 Encounter for other preprocedural examination: Secondary | ICD-10-CM

## 2021-08-30 DIAGNOSIS — D214 Benign neoplasm of connective and other soft tissue of abdomen: Secondary | ICD-10-CM | POA: Diagnosis not present

## 2021-08-30 DIAGNOSIS — G4733 Obstructive sleep apnea (adult) (pediatric): Secondary | ICD-10-CM | POA: Diagnosis not present

## 2021-08-30 DIAGNOSIS — E039 Hypothyroidism, unspecified: Secondary | ICD-10-CM | POA: Insufficient documentation

## 2021-08-30 HISTORY — DX: COVID-19: U07.1

## 2021-08-30 HISTORY — DX: Headache, unspecified: R51.9

## 2021-08-30 HISTORY — DX: Diabetes insipidus: E23.2

## 2021-08-30 LAB — BASIC METABOLIC PANEL
Anion gap: 7 (ref 5–15)
BUN: 11 mg/dL (ref 6–20)
CO2: 21 mmol/L — ABNORMAL LOW (ref 22–32)
Calcium: 9.2 mg/dL (ref 8.9–10.3)
Chloride: 115 mmol/L — ABNORMAL HIGH (ref 98–111)
Creatinine, Ser: 1.31 mg/dL — ABNORMAL HIGH (ref 0.44–1.00)
GFR, Estimated: 56 mL/min — ABNORMAL LOW (ref >60)
Glucose, Bld: 81 mg/dL (ref 70–99)
Potassium: 3.8 mmol/L (ref 3.5–5.1)
Sodium: 143 mmol/L (ref 135–145)

## 2021-08-30 LAB — CBC
HCT: 32 % — ABNORMAL LOW (ref 36.0–46.0)
Hemoglobin: 9.6 g/dL — ABNORMAL LOW (ref 12.0–15.0)
MCH: 24.3 pg — ABNORMAL LOW (ref 26.0–34.0)
MCHC: 30 g/dL (ref 30.0–36.0)
MCV: 81 fL (ref 80.0–100.0)
Platelets: 440 10*3/uL — ABNORMAL HIGH (ref 150–400)
RBC: 3.95 MIL/uL (ref 3.87–5.11)
RDW: 14.2 % (ref 11.5–15.5)
WBC: 6.2 10*3/uL (ref 4.0–10.5)
nRBC: 0 % (ref 0.0–0.2)

## 2021-08-30 LAB — ABO/RH: ABO/RH(D): O POS

## 2021-08-30 NOTE — Progress Notes (Addendum)
PCP - Dr Fabian Sharp transitioning to Betty [place], MD Cardiologist - denies Endocrinologist- Dr. Marjory Sneddon  PPM/ICD - denies Chest x-ray - N/A EKG - N/A Stress Test - pt reports normal stress test OR Echo before gastric sleeve- unsure where this was done  Cardiac Cath - denies  Sleep Study - 04/30/14 CPAP - unsure of settings  Fasting Blood Sugar - non DM  Blood Thinner Instructions:N/A Aspirin Instructions:N/A  ERAS Protcol - ERAS per protocol   COVID TEST- N/A   Anesthesia review: review labs- Hgb 9.6   Patient denies shortness of breath, fever, cough and chest pain at PAT appointment   All instructions explained to the patient, with a verbal understanding of the material. Patient agrees to go over the instructions while at home for a better understanding. Patient also instructed to self quarantine after being tested for COVID-19. The opportunity to ask questions was provided.

## 2021-08-31 NOTE — Anesthesia Preprocedure Evaluation (Addendum)
Anesthesia Evaluation  Patient identified by MRN, date of birth, ID band Patient awake    Reviewed: Allergy & Precautions, NPO status , Patient's Chart, lab work & pertinent test results  History of Anesthesia Complications Negative for: history of anesthetic complications  Airway Mallampati: II  TM Distance: >3 FB Neck ROM: Full    Dental no notable dental hx. (+) Dental Advisory Given Braces:   Pulmonary sleep apnea and Continuous Positive Airway Pressure Ventilation ,    Pulmonary exam normal        Cardiovascular negative cardio ROS Normal cardiovascular exam     Neuro/Psych negative neurological ROS     GI/Hepatic negative GI ROS, Neg liver ROS,   Endo/Other  Morbid obesityDI  Renal/GU negative Renal ROS     Musculoskeletal negative musculoskeletal ROS (+)   Abdominal   Peds  Hematology  (+) Blood dyscrasia, anemia ,   Anesthesia Other Findings   Reproductive/Obstetrics                            Anesthesia Physical Anesthesia Plan  ASA: 3  Anesthesia Plan: General   Post-op Pain Management: Tylenol PO (pre-op)* and Celebrex PO (pre-op)*   Induction: Intravenous  PONV Risk Score and Plan: 4 or greater and Ondansetron, Dexamethasone, Midazolam and Scopolamine patch - Pre-op  Airway Management Planned: Oral ETT  Additional Equipment:   Intra-op Plan:   Post-operative Plan: Extubation in OR  Informed Consent: I have reviewed the patients History and Physical, chart, labs and discussed the procedure including the risks, benefits and alternatives for the proposed anesthesia with the patient or authorized representative who has indicated his/her understanding and acceptance.     Dental advisory given  Plan Discussed with: Anesthesiologist and CRNA  Anesthesia Plan Comments: (PAT note written 08/31/2021 by Myra Gianotti, PA-C. )      Anesthesia Quick Evaluation

## 2021-09-08 ENCOUNTER — Ambulatory Visit (HOSPITAL_COMMUNITY): Payer: BC Managed Care – PPO | Admitting: Vascular Surgery

## 2021-09-08 ENCOUNTER — Other Ambulatory Visit: Payer: Self-pay

## 2021-09-08 ENCOUNTER — Inpatient Hospital Stay (HOSPITAL_COMMUNITY)
Admission: RE | Admit: 2021-09-08 | Discharge: 2021-09-10 | DRG: 742 | Disposition: A | Discharge status: Home / Self Care | Payer: BC Managed Care – PPO | Attending: Obstetrics and Gynecology | Admitting: Obstetrics and Gynecology

## 2021-09-08 ENCOUNTER — Encounter (HOSPITAL_COMMUNITY): Payer: Self-pay | Admitting: Obstetrics and Gynecology

## 2021-09-08 ENCOUNTER — Encounter (HOSPITAL_COMMUNITY)
Admission: RE | Disposition: A | Discharge status: Home / Self Care | Payer: Self-pay | Source: Home / Self Care | Attending: Obstetrics and Gynecology

## 2021-09-08 DIAGNOSIS — N946 Dysmenorrhea, unspecified: Secondary | ICD-10-CM | POA: Diagnosis present

## 2021-09-08 DIAGNOSIS — Z8616 Personal history of COVID-19: Secondary | ICD-10-CM | POA: Diagnosis not present

## 2021-09-08 DIAGNOSIS — D259 Leiomyoma of uterus, unspecified: Principal | ICD-10-CM | POA: Diagnosis present

## 2021-09-08 DIAGNOSIS — Z01818 Encounter for other preprocedural examination: Secondary | ICD-10-CM

## 2021-09-08 DIAGNOSIS — Z6841 Body Mass Index (BMI) 40.0 and over, adult: Secondary | ICD-10-CM | POA: Diagnosis not present

## 2021-09-08 DIAGNOSIS — D649 Anemia, unspecified: Principal | ICD-10-CM

## 2021-09-08 DIAGNOSIS — Z9889 Other specified postprocedural states: Secondary | ICD-10-CM

## 2021-09-08 HISTORY — PX: MYOMECTOMY: SHX85

## 2021-09-08 LAB — POCT I-STAT, CHEM 8
BUN: 12 mg/dL (ref 6–20)
BUN: 6 mg/dL (ref 6–20)
Calcium, Ion: 1.23 mmol/L (ref 1.15–1.40)
Calcium, Ion: 0.9 mmol/L — ABNORMAL LOW (ref 1.15–1.40)
Chloride: 110 mmol/L (ref 98–111)
Chloride: 109 mmol/L (ref 98–111)
Creatinine, Ser: 1.2 mg/dL — ABNORMAL HIGH (ref 0.44–1.00)
Creatinine, Ser: 0.8 mg/dL (ref 0.44–1.00)
Glucose, Bld: 83 mg/dL (ref 70–99)
Glucose, Bld: 82 mg/dL (ref 70–99)
HCT: 31 % — ABNORMAL LOW (ref 36.0–46.0)
HCT: 29 % — ABNORMAL LOW (ref 36.0–46.0)
Hemoglobin: 10.5 g/dL — ABNORMAL LOW (ref 12.0–15.0)
Hemoglobin: 9.9 g/dL — ABNORMAL LOW (ref 12.0–15.0)
Potassium: 4.5 mmol/L (ref 3.5–5.1)
Potassium: 3.8 mmol/L (ref 3.5–5.1)
Sodium: 147 mmol/L — ABNORMAL HIGH (ref 135–145)
Sodium: 134 mmol/L — ABNORMAL LOW (ref 135–145)
TCO2: 22 mmol/L (ref 22–32)
TCO2: 12 mmol/L — ABNORMAL LOW (ref 22–32)

## 2021-09-08 LAB — CREATININE, SERUM
Creatinine, Ser: 1.2 mg/dL — ABNORMAL HIGH (ref 0.44–1.00)
GFR, Estimated: >60 mL/min (ref >60)

## 2021-09-08 LAB — CBC
HCT: 29.1 % — ABNORMAL LOW (ref 36.0–46.0)
Hemoglobin: 8.4 g/dL — ABNORMAL LOW (ref 12.0–15.0)
MCH: 23.1 pg — ABNORMAL LOW (ref 26.0–34.0)
MCHC: 28.9 g/dL — ABNORMAL LOW (ref 30.0–36.0)
MCV: 80.2 fL (ref 80.0–100.0)
Platelets: 371 10*3/uL (ref 150–400)
RBC: 3.63 MIL/uL — ABNORMAL LOW (ref 3.87–5.11)
RDW: 14.4 % (ref 11.5–15.5)
WBC: 14.9 10*3/uL — ABNORMAL HIGH (ref 4.0–10.5)
nRBC: 0 % (ref 0.0–0.2)

## 2021-09-08 LAB — GLUCOSE, CAPILLARY
Glucose-Capillary: 68 mg/dL — ABNORMAL LOW (ref 70–99)
Glucose-Capillary: 78 mg/dL (ref 70–99)

## 2021-09-08 LAB — SODIUM: Sodium: 144 mmol/L (ref 135–145)

## 2021-09-08 LAB — PREPARE RBC (CROSSMATCH)

## 2021-09-08 LAB — POCT PREGNANCY, URINE: Preg Test, Ur: NEGATIVE

## 2021-09-08 SURGERY — MYOMECTOMY, ABDOMINAL APPROACH
Anesthesia: General | Site: Abdomen

## 2021-09-08 MED ORDER — CHLORHEXIDINE GLUCONATE 0.12 % MT SOLN
15.0000 mL | Freq: Once | OROMUCOSAL | Status: AC
  Administered 2021-09-08: 15 mL via OROMUCOSAL
  Filled 2021-09-08: qty 15

## 2021-09-08 MED ORDER — DEXTROSE IN LACTATED RINGERS 5 % IV SOLN
INTRAVENOUS | Status: DC
  Administered 2021-09-08 – 2021-09-09 (×2): 125 mL/h via INTRAVENOUS

## 2021-09-08 MED ORDER — SIMETHICONE 80 MG PO CHEW: 80.0000 mg | CHEWABLE_TABLET | Freq: Four times a day (QID) | ORAL | Status: DC | PRN

## 2021-09-08 MED ORDER — PROPOFOL 10 MG/ML IV BOLUS
INTRAVENOUS | Status: DC | PRN
  Administered 2021-09-08: 200 mg via INTRAVENOUS

## 2021-09-08 MED ORDER — SODIUM CHLORIDE (PF) 0.9 % IJ SOLN
INTRAMUSCULAR | Status: AC
  Filled 2021-09-08: qty 100

## 2021-09-08 MED ORDER — FENTANYL CITRATE (PF) 250 MCG/5ML IJ SOLN
INTRAMUSCULAR | Status: AC
  Filled 2021-09-08: qty 5

## 2021-09-08 MED ORDER — HYDROMORPHONE 1 MG/ML IV SOLN
INTRAVENOUS | Status: DC
  Administered 2021-09-08 – 2021-09-09 (×3): 0.4 mg via INTRAVENOUS
  Filled 2021-09-08: qty 30

## 2021-09-08 MED ORDER — PHENYLEPHRINE HCL-NACL 20-0.9 MG/250ML-% IV SOLN
INTRAVENOUS | Status: DC | PRN
  Administered 2021-09-08: 20 ug/min via INTRAVENOUS

## 2021-09-08 MED ORDER — FENTANYL CITRATE (PF) 100 MCG/2ML IJ SOLN
INTRAMUSCULAR | Status: AC
  Filled 2021-09-08: qty 2

## 2021-09-08 MED ORDER — CEFAZOLIN IN SODIUM CHLORIDE 3-0.9 GM/100ML-% IV SOLN
INTRAVENOUS | Status: AC
  Filled 2021-09-08: qty 100

## 2021-09-08 MED ORDER — ENOXAPARIN SODIUM 60 MG/0.6ML IJ SOSY
60.0000 mg | PREFILLED_SYRINGE | INTRAMUSCULAR | Status: DC
  Administered 2021-09-09 – 2021-09-10 (×2): 60 mg via SUBCUTANEOUS
  Filled 2021-09-08 (×2): qty 0.6

## 2021-09-08 MED ORDER — DIPHENHYDRAMINE HCL 50 MG/ML IJ SOLN: 12.5000 mg | Freq: Four times a day (QID) | INTRAMUSCULAR | Status: DC | PRN

## 2021-09-08 MED ORDER — PHENYLEPHRINE 80 MCG/ML (10ML) SYRINGE FOR IV PUSH (FOR BLOOD PRESSURE SUPPORT)
PREFILLED_SYRINGE | INTRAVENOUS | Status: DC | PRN
  Administered 2021-09-08: 160 ug via INTRAVENOUS
  Administered 2021-09-08: 80 ug via INTRAVENOUS
  Administered 2021-09-08: 160 ug via INTRAVENOUS

## 2021-09-08 MED ORDER — VASOPRESSIN 20 UNIT/ML IV SOLN
INTRAVENOUS | Status: DC | PRN
  Administered 2021-09-08: 10 mL via INTRAMUSCULAR

## 2021-09-08 MED ORDER — PROPOFOL 10 MG/ML IV BOLUS
INTRAVENOUS | Status: AC
  Filled 2021-09-08: qty 20

## 2021-09-08 MED ORDER — MIDAZOLAM HCL 2 MG/2ML IJ SOLN
INTRAMUSCULAR | Status: DC | PRN
  Administered 2021-09-08: 2 mg via INTRAVENOUS

## 2021-09-08 MED ORDER — ROCURONIUM BROMIDE 10 MG/ML (PF) SYRINGE
PREFILLED_SYRINGE | INTRAVENOUS | Status: DC | PRN
  Administered 2021-09-08: 100 mg via INTRAVENOUS
  Administered 2021-09-08: 30 mg via INTRAVENOUS

## 2021-09-08 MED ORDER — ORAL CARE MOUTH RINSE: 15.0000 mL | Freq: Once | OROMUCOSAL | Status: AC

## 2021-09-08 MED ORDER — LIDOCAINE 2% (20 MG/ML) 5 ML SYRINGE
INTRAMUSCULAR | Status: DC | PRN
  Administered 2021-09-08: 100 mg via INTRAVENOUS

## 2021-09-08 MED ORDER — MIDAZOLAM HCL 2 MG/2ML IJ SOLN
INTRAMUSCULAR | Status: AC
  Filled 2021-09-08: qty 2

## 2021-09-08 MED ORDER — DIPHENHYDRAMINE HCL 12.5 MG/5ML PO ELIX: 12.5000 mg | ORAL_SOLUTION | Freq: Four times a day (QID) | ORAL | Status: DC | PRN

## 2021-09-08 MED ORDER — METOCLOPRAMIDE HCL 5 MG/ML IJ SOLN
10.0000 mg | Freq: Four times a day (QID) | INTRAMUSCULAR | Status: DC
  Administered 2021-09-08 – 2021-09-09 (×5): 10 mg via INTRAVENOUS
  Filled 2021-09-08 (×6): qty 2

## 2021-09-08 MED ORDER — LACTATED RINGERS IV SOLN: INTRAVENOUS | Status: DC

## 2021-09-08 MED ORDER — 0.9 % SODIUM CHLORIDE (POUR BTL) OPTIME
TOPICAL | Status: DC | PRN
  Administered 2021-09-08: 1000 mL

## 2021-09-08 MED ORDER — OXYCODONE HCL 5 MG PO TABS
5.0000 mg | ORAL_TABLET | ORAL | Status: DC | PRN
  Administered 2021-09-09: 10 mg via ORAL
  Administered 2021-09-09: 5 mg via ORAL
  Administered 2021-09-10: 10 mg via ORAL
  Filled 2021-09-08: qty 1
  Filled 2021-09-08 (×2): qty 2

## 2021-09-08 MED ORDER — CEFAZOLIN SODIUM-DEXTROSE 1-4 GM/50ML-% IV SOLN
INTRAVENOUS | Status: DC | PRN
  Administered 2021-09-08: 3 g via INTRAVENOUS

## 2021-09-08 MED ORDER — ACETAMINOPHEN 10 MG/ML IV SOLN
INTRAVENOUS | Status: DC | PRN
  Administered 2021-09-08: 1000 mg via INTRAVENOUS

## 2021-09-08 MED ORDER — ALBUMIN HUMAN 5 % IV SOLN: INTRAVENOUS | Status: DC | PRN

## 2021-09-08 MED ORDER — SUGAMMADEX SODIUM 200 MG/2ML IV SOLN
INTRAVENOUS | Status: DC | PRN
  Administered 2021-09-08: 400 mg via INTRAVENOUS

## 2021-09-08 MED ORDER — NALOXONE HCL 0.4 MG/ML IJ SOLN: 0.4000 mg | INTRAMUSCULAR | Status: DC | PRN

## 2021-09-08 MED ORDER — SCOPOLAMINE 1 MG/3DAYS TD PT72
MEDICATED_PATCH | TRANSDERMAL | Status: DC | PRN
  Administered 2021-09-08: 1 via TRANSDERMAL

## 2021-09-08 MED ORDER — AMISULPRIDE (ANTIEMETIC) 5 MG/2ML IV SOLN: 10.0000 mg | Freq: Once | INTRAVENOUS | Status: DC | PRN

## 2021-09-08 MED ORDER — ONDANSETRON HCL 4 MG/2ML IJ SOLN
INTRAMUSCULAR | Status: DC | PRN
  Administered 2021-09-08: 4 mg via INTRAVENOUS

## 2021-09-08 MED ORDER — SODIUM CHLORIDE 0.9% FLUSH: 9.0000 mL | INTRAVENOUS | Status: DC | PRN

## 2021-09-08 MED ORDER — ONDANSETRON HCL 4 MG/2ML IJ SOLN: 4.0000 mg | Freq: Four times a day (QID) | INTRAMUSCULAR | Status: DC | PRN

## 2021-09-08 MED ORDER — PROMETHAZINE HCL 25 MG/ML IJ SOLN: 6.2500 mg | INTRAMUSCULAR | Status: DC | PRN

## 2021-09-08 MED ORDER — LACTATED RINGERS IV SOLN: INTRAVENOUS | Status: DC | PRN

## 2021-09-08 MED ORDER — FENTANYL CITRATE (PF) 100 MCG/2ML IJ SOLN
25.0000 ug | INTRAMUSCULAR | Status: DC | PRN
  Administered 2021-09-08 (×2): 50 ug via INTRAVENOUS

## 2021-09-08 MED ORDER — FENTANYL CITRATE (PF) 250 MCG/5ML IJ SOLN
INTRAMUSCULAR | Status: DC | PRN
  Administered 2021-09-08: 50 ug via INTRAVENOUS
  Administered 2021-09-08: 100 ug via INTRAVENOUS

## 2021-09-08 MED ORDER — DEXAMETHASONE SODIUM PHOSPHATE 10 MG/ML IJ SOLN
INTRAMUSCULAR | Status: DC | PRN
  Administered 2021-09-08: 10 mg via INTRAVENOUS

## 2021-09-08 MED ORDER — VASOPRESSIN 20 UNIT/ML IV SOLN
INTRAVENOUS | Status: AC
  Filled 2021-09-08: qty 1

## 2021-09-08 MED ORDER — ACETAMINOPHEN 500 MG PO TABS
1000.0000 mg | ORAL_TABLET | Freq: Four times a day (QID) | ORAL | Status: DC
  Administered 2021-09-08 – 2021-09-09 (×4): 1000 mg via ORAL
  Filled 2021-09-08 (×4): qty 2

## 2021-09-08 MED ORDER — POLYETHYLENE GLYCOL 3350 17 G PO PACK
17.0000 g | PACK | Freq: Two times a day (BID) | ORAL | Status: DC
Start: 2021-09-08 — End: 2021-09-10
  Administered 2021-09-09 – 2021-09-10 (×3): 17 g via ORAL
  Filled 2021-09-08 (×4): qty 1

## 2021-09-08 MED ORDER — MENTHOL 3 MG MT LOZG: 1.0000 | LOZENGE | OROMUCOSAL | Status: DC | PRN

## 2021-09-08 SURGICAL SUPPLY — 57 items
APL SKNCLS STERI-STRIP NONHPOA (GAUZE/BANDAGES/DRESSINGS) ×1
BAG COUNTER SPONGE SURGICOUNT (BAG) ×2 IMPLANT
BAG SPNG CNTER NS LX DISP (BAG) ×1
BARRIER ADHS 3X4 INTERCEED (GAUZE/BANDAGES/DRESSINGS) ×1 IMPLANT
BENZOIN TINCTURE PRP APPL 2/3 (GAUZE/BANDAGES/DRESSINGS) ×1 IMPLANT
BRR ADH 4X3 ABS CNTRL BYND (GAUZE/BANDAGES/DRESSINGS) ×1
CANISTER SUCT 3000ML PPV (MISCELLANEOUS) ×1 IMPLANT
DRAIN JACKSON PRT FLT 7MM (DRAIN) IMPLANT
DRAPE CESAREAN BIRTH W POUCH (DRAPES) ×2 IMPLANT
DRAPE WARM FLUID 44X44 (DRAPES) ×1 IMPLANT
DRSG OPSITE POSTOP 4X10 (GAUZE/BANDAGES/DRESSINGS) ×2 IMPLANT
ELECT NDL TIP 2.8 STRL (NEEDLE) IMPLANT
ELECT NEEDLE TIP 2.8 STRL (NEEDLE) ×2 IMPLANT
EVACUATOR SILICONE 100CC (DRAIN) IMPLANT
GAUZE 4X4 16PLY ~~LOC~~+RFID DBL (SPONGE) ×1 IMPLANT
GLOVE BIO SURGEON STRL SZ 6.5 (GLOVE) ×2 IMPLANT
GLOVE BIOGEL PI IND STRL 7.0 (GLOVE) ×2 IMPLANT
GLOVE BIOGEL PI INDICATOR 7.0 (GLOVE) ×2
GOWN STRL REUS W/ TWL LRG LVL3 (GOWN DISPOSABLE) ×2 IMPLANT
GOWN STRL REUS W/TWL LRG LVL3 (GOWN DISPOSABLE) ×6
HEMOSTAT SURGICEL 2X14 (HEMOSTASIS) IMPLANT
KIT TURNOVER KIT B (KITS) ×2 IMPLANT
MANIFOLD NEPTUNE II (INSTRUMENTS) ×1 IMPLANT
MANIPULATOR UTERINE 4.5 ZUMI (MISCELLANEOUS) IMPLANT
NEEDLE HYPO 22GX1.5 SAFETY (NEEDLE) ×4 IMPLANT
NS IRRIG 1000ML POUR BTL (IV SOLUTION) ×2 IMPLANT
PACK ABDOMINAL GYN (CUSTOM PROCEDURE TRAY) ×2 IMPLANT
PAD ARMBOARD 7.5X6 YLW CONV (MISCELLANEOUS) ×2 IMPLANT
PAD OB MATERNITY 4.3X12.25 (PERSONAL CARE ITEMS) ×2 IMPLANT
PENCIL SMOKE EVACUATOR (MISCELLANEOUS) ×2 IMPLANT
RTRCTR C-SECT PINK 25CM LRG (MISCELLANEOUS) ×1 IMPLANT
SHEET LAVH (DRAPES) IMPLANT
SPIKE FLUID TRANSFER (MISCELLANEOUS) ×3 IMPLANT
SPONGE SURGIFOAM ABS GEL 12-7 (HEMOSTASIS) IMPLANT
STAPLER VISISTAT 35W (STAPLE) ×1 IMPLANT
STRIP CLOSURE SKIN 1/2X4 (GAUZE/BANDAGES/DRESSINGS) ×1 IMPLANT
SUT CHROMIC 0 CT 1 (SUTURE) ×2 IMPLANT
SUT CHROMIC 3 0 SH 27 (SUTURE) IMPLANT
SUT MNCRL AB 3-0 PS2 27 (SUTURE) ×1 IMPLANT
SUT PDS AB 0 CT 36 (SUTURE) IMPLANT
SUT PLAIN 2 0 XLH (SUTURE) ×1 IMPLANT
SUT VIC AB 0 CT1 18XCR BRD8 (SUTURE) ×3 IMPLANT
SUT VIC AB 0 CT1 36 (SUTURE) ×4 IMPLANT
SUT VIC AB 0 CT1 8-18 (SUTURE) ×2
SUT VIC AB 2-0 FS1 27 (SUTURE) ×4 IMPLANT
SUT VIC AB 2-0 SH 27 (SUTURE) ×2
SUT VIC AB 2-0 SH 27XBRD (SUTURE) IMPLANT
SUT VICRYL 0 27 CT2 27 ABS (SUTURE) IMPLANT
SUT VICRYL 0 TIES 12 18 (SUTURE) IMPLANT
SUT VICRYL 0 UR6 27IN ABS (SUTURE) IMPLANT
SUT VICRYL 2 0 18  UND BR (SUTURE)
SUT VICRYL 2 0 18 UND BR (SUTURE) IMPLANT
SYR 50ML LL SCALE MARK (SYRINGE) IMPLANT
SYR BULB IRRIG 60ML STRL (SYRINGE) ×1 IMPLANT
SYR CONTROL 10ML LL (SYRINGE) ×4 IMPLANT
TOWEL GREEN STERILE FF (TOWEL DISPOSABLE) ×4 IMPLANT
TRAY FOLEY W/BAG SLVR 14FR (SET/KITS/TRAYS/PACK) ×2 IMPLANT

## 2021-09-08 NOTE — Progress Notes (Signed)
  Hypoglycemic Event  CBG: 68  Treatment: 4 oz juice/soda  Symptoms: None  Follow-up CBG: Time:1205 CBG Result:78  Possible Reasons for Event: Inadequate meal intake  Comments/MD notified:Dr. Debbrah Alar

## 2021-09-08 NOTE — Op Note (Signed)
Preop diagnosis: Symptomatic fibroids causing dysmenorrhea and menorrhgia Postdiagnosis:  Same Procedure: Abdominal myomectomy with entry into the endometrial cavity Surgeon: Dr. Charlesetta Garibaldi Assistant: Gaylord Shih RNFA Anesthesia: General endotracheal tube Estimated blood loss is 100 cc Urine output: 500 cc IV fluids: 1200 cc Indications: None Procedure in detail: She was taken to the operating room placed in dorsal supine position and prepped and draped in the normal sterile fashion. A Pfannenstiel skin incision was made with a knife and carried down to the fascia using Bovie cautery. The fascia was incised in midline extended bilaterally. Cokers x2 placed in the superior aspect of the fascia was dissected off the rectus muscles both sharply and bluntly.  Peritoneum was identified And entered sharply with Metzenbaum scissors. The incision was extended superiorly and inferiorly with good visualization of bowel bladder.  I was able to lift the uterus out of the abdominal cavity using a single tooth tnaculum.   Both tubes and ovaries were normal in appearance.   Petrussin was placed on the posterior aspect of the uterus  in the midline.  The incision was about 4 cm long. The fibroid was then bluntly and sharply dissected out of the cavity was about 3cm in size. A large 6 cm fibroid was removed in a similr fashion.  Another incision was made posterior and to the right of the incision.  It was about 3 cm long.  Another 6 cm fibroid was removed through sharp and blunt dissection.  The endometrial cavity was entered. The incisions were closed in layers using 0 and 2-0 Vicryl with figure-of-eight interrupted suture. The serosa was closed using a 2-0 Vicryl in a baseball stitch fashion.  Irrigation was done. Hemo Stasis was assured.intercede  was placed along the posterior suture lines. The uterus was replaced back into the pelvic cavity. The peritoneum was closed to 0 chromic.  Muscles were irrigated and  hemostatic with Bovie cautery and any bleeding areas. The fascia was closed using 0 Vicryl in a running fashion. 2-0 plain interrupted sutures were used to reapproximate the subcutaneous tissue. The skin was closed with 3-0 Monocryl subarticular fashion. The  counts were correct patient went to PACU  in stable condition this is Dr. Charlesetta Garibaldi dictating I was present for the entire procedure thank you

## 2021-09-08 NOTE — H&P (Signed)
Tiffany Roth is an 31 y.o. female. Pt presenting for myomectomy secondary to symptomatioc fibroids.  She came to me because of dysmenorrhea and heavy menses.  She went for a consult about an robotic myomectomy.  She decided to proceed with exlap.  All treatments for fibroids were reviewed with the pt. .    Pertinent Gynecological History: Menses: usually lasting 5-7 to   days  Contraception: none  Blood transfusions: none Sexually transmitted diseases: no past history Previous GYN Procedures:  none   Last mammogram:  na  Date: na Last pap: normal Date: 2022 OB History: G0, P0   Menstrual History: Menarche age: 31 Patient's last menstrual period was 08/22/2021.    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    Past Surgical History:  Procedure Laterality Date   LAPAROSCOPIC GASTRIC SLEEVE RESECTION N/A 06/02/2014   Procedure: LAPAROSCOPIC GASTRIC SLEEVE RESECTION;  Surgeon: Greer Pickerel, MD;  Location: [facility];  Service: General;  Laterality: N/A;   TYMPANOSTOMY TUBE PLACEMENT     childhood   UPPER GI ENDOSCOPY N/A 06/02/2014   Procedure: UPPER GI ENDOSCOPY;  Surgeon: Greer Pickerel, MD;  Location: [facility];  Service: General;  Laterality: N/A;   varicose veins      surgically repaired 2011    Family History  Problem Relation Age of Onset   Thyroid disease Mother    Hypertension Mother     Social History:   reports that she has never smoked. She has never used smokeless tobacco. She reports that she does not drink alcohol and does not use drugs.  Allergies:  Allergies  Allergen Reactions   Dairycare [Lactase-Lactobacillus] Nausea And Vomiting    Dairy products   Ibuprofen Other (See Comments)    DR told her not to take   Other Nausea And Vomiting    DAIRY products    Medications Prior to Admission  Medication Sig Dispense Refill Last Dose   cetirizine (ZYRTEC ALLERGY) 10 MG tablet Take 1 tablet (10 mg total) by mouth daily. 90 tablet 3 09/08/2021 at 0600   desmopressin (DDAVP) 0.1 MG tablet Take 0.1 mg by mouth daily.   09/08/2021 at 0600   fluticasone (FLONASE) 50 MCG/ACT nasal spray PLACE 1 SPRAY INTO BOTH NOSTRILS 2 (TWO) TIMES DAILY (Patient taking differently: Place 1 spray into both nostrils daily.) 16 mL 3 09/08/2021 at 0600   Multiple Vitamin (MULTIVITAMIN ADULT PO) Take 1 tablet by mouth daily.   Past Week   Skin Protectants, Misc. (EUCERIN) cream Apply 1 Application topically daily at 12 noon.   09/07/2021   triamcinolone cream (KENALOG) 0.1 % Apply 1 application topically 2 (two) times daily as needed. Do not apply to face, groin or armpits (Patient taking differently: Apply 1 application  topically 2 (two) times daily as needed (Eczema). Do not apply to face, groin or armpits) 453 g 3    clotrimazole-betamethasone (LOTRISONE) cream Apply 1 application. topically daily. (Patient not taking: Reported on 08/26/2021) 45 g 0 Not Taking   hydrocortisone 2.5 % ointment Apply topically twice daily as need to red sandpapery rash. 30 g 0 More than a month   hydrOXYzine (VISTARIL) 25 MG capsule Take 1 capsule (25 mg total) by mouth every 8 (eight) hours as needed for itching. At night 30 capsule 0 More than a month   ketoconazole (NIZORAL) 2 % shampoo Apply 1 application. topically 2 (two) times a week. (Patient not taking: Reported on 08/26/2021) 120 mL 0 Not Taking   pimecrolimus (ELIDEL) 1 % cream  Apply topically 2 (two) times daily. (Patient not taking: Reported on 08/26/2021) 30 g 0 Not Taking   Semaglutide,0.25 or 0.'5MG'$ /DOS, (OZEMPIC, 0.25 OR 0.5 MG/DOSE,) 2 MG/1.5ML SOPN Inject 0.25 mg into the skin once a week. For 4 weeks, then increase to 0.5 mg per week (Patient not taking: Reported on 08/26/2021) 1.5 mL 1 Not Taking    ROS  Blood pressure (!) 141/78, pulse 88, temperature 98.3 F (36.8 C), temperature source Oral, resp. rate 18, height '5\' 1"'$  (1.549 m), weight 120.7 kg, last menstrual period 08/22/2021, SpO2 100 %. Physical Exam\Physical Examination: General appearance - alert, well appearing, and in no distress Chest - clear to auscultation, no wheezes, rales or rhonchi, symmetric air entry Heart - normal rate and regular rhythm Abdomen - soft, nontender, nondistended, no masses or organomegaly Breasts - breasts appear normal, no suspicious masses, no skin or nipple changes or axillary nodes Pelvic - normal external genitalia, vulva, vagina, cervix, uterus enlarged and irreg.  and adnexa WNl Extremities - peripheral pulses normal, no pedal edema, no clubbing or cyanosis, Homan's sign negative bilaterally Skin - normal coloration and turgor, no rashes, no suspicious skin lesions noted DERMATITIS NOTED: eczematoid dermatitis on face and trunk   Results for orders placed or performed during the hospital encounter of 09/08/21 (from the past 24 hour(s))  Prepare RBC (crossmatch)     Status: None   Collection Time: 09/08/21  8:00 AM  Result Value Ref Range   Order Confirmation      ORDER PROCESSED BY BLOOD BANK Performed at [facility], [address], [place], [place] [address]   I-STAT, [place] 8     Status: Abnormal   Collection Time: 09/08/21  8:24 AM  Result Value Ref Range   Sodium 147 (H) 135 - 145 mmol/L   Potassium 4.5 3.5 - 5.1 mmol/L   Chloride 110 98 - 111 mmol/L   BUN 12 6 - 20 mg/dL   Creatinine, Ser 1.20 (H) 0.44 - 1.00 mg/dL   Glucose, Bld 83 70 - 99  mg/dL   Calcium, Ion 1.23 1.15 - 1.40 mmol/L   TCO2 22 22 - 32 mmol/L   Hemoglobin 10.5 (L) 12.0 - 15.0 g/dL   HCT 31.0 (L) 36.0 - 46.0 %  Pregnancy, urine POC     Status: None   Collection Time: 09/08/21  8:30 AM  Result Value Ref Range   Preg Test, Ur NEGATIVE NEGATIVE    No results found.  Assessment/Plan: Pt desires Abdominal Myomectomy Pt understands the risks to be but not limited to bleeding, infection,problems with anesthesia, nerve damage from position during surgery and  damage to internal organs such as bowel, bladder and other major organs.  All other options  have been reviewed with the patient.  They are but not limited to observation, hormonal management, D&C with and without ablation, [place], lupron and removal of just the diseased tissue.  Pt also signed a consent in the office Jisela Merlino A Sennie Borden 09/08/2021, 9:12 AM

## 2021-09-08 NOTE — Anesthesia Postprocedure Evaluation (Signed)
Anesthesia Post Note  Patient: Tiffany Roth  Procedure(s) Performed: ABDOMINAL MYOMECTOMY (Abdomen)     Patient location during evaluation: PACU Anesthesia Type: General Level of consciousness: sedated Pain management: pain level controlled Vital Signs Assessment: post-procedure vital signs reviewed and stable Respiratory status: spontaneous breathing and respiratory function stable Cardiovascular status: stable Postop Assessment: no apparent nausea or vomiting Anesthetic complications: no   No notable events documented.  Last Vitals:  Vitals:   09/08/21 1230 09/08/21 1300  BP: 129/83 134/78  Pulse: 91 85  Resp: (!) 21 (!) 26  Temp:  37 C  SpO2: 100% 95%    Last Pain:  Vitals:   09/08/21 1230  TempSrc:   PainSc: 7                  Marzell Isakson DANIEL

## 2021-09-08 NOTE — Anesthesia Procedure Notes (Signed)
Procedure Name: Intubation Date/Time: 09/08/2021 9:29 AM  Performed by: Vonna Drafts, CRNAPre-anesthesia Checklist: Patient identified, Emergency Drugs available, Suction available and Patient being monitored Patient Re-evaluated:Patient Re-evaluated prior to induction Oxygen Delivery Method: Circle system utilized Preoxygenation: Pre-oxygenation with 100% oxygen Induction Type: IV induction Ventilation: Mask ventilation without difficulty Laryngoscope Size: Mac and 3 Grade View: Grade I Tube type: Oral Tube size: 7.0 mm Number of attempts: 1 Airway Equipment and Method: Stylet and Oral airway Placement Confirmation: ETT inserted through vocal cords under direct vision, positive ETCO2 and breath sounds checked- equal and bilateral Secured at: 22 cm Tube secured with: Tape Dental Injury: Teeth and Oropharynx as per pre-operative assessment

## 2021-09-08 NOTE — Transfer of Care (Signed)
Immediate Anesthesia Transfer of Care Note  Patient: Tiffany Roth  Procedure(s) Performed: ABDOMINAL MYOMECTOMY (Abdomen)  Patient Location: PACU  Anesthesia Type:General  Level of Consciousness: awake  Airway & Oxygen Therapy: Patient Spontanous Breathing and Patient connected to face mask oxygen  Post-op Assessment: Report given to RN and Post -op Vital signs reviewed and stable  Post vital signs: Reviewed and stable  Last Vitals:  Vitals Value Taken Time  BP 113/103 09/08/21 1130  Temp    Pulse 101 09/08/21 1130  Resp 15 09/08/21 1130  SpO2 88 % 09/08/21 1130  Vitals shown include unvalidated device data.  Last Pain:  Vitals:   09/08/21 0752  TempSrc:   PainSc: 0-No pain         Complications: No notable events documented.

## 2021-09-09 ENCOUNTER — Encounter (HOSPITAL_COMMUNITY): Payer: Self-pay | Admitting: Obstetrics and Gynecology

## 2021-09-09 LAB — BASIC METABOLIC PANEL
Anion gap: 13 (ref 5–15)
BUN: 12 mg/dL (ref 6–20)
CO2: 21 mmol/L — ABNORMAL LOW (ref 22–32)
Calcium: 9.3 mg/dL (ref 8.9–10.3)
Chloride: 112 mmol/L — ABNORMAL HIGH (ref 98–111)
Creatinine, Ser: 1.39 mg/dL — ABNORMAL HIGH (ref 0.44–1.00)
GFR, Estimated: 52 mL/min — ABNORMAL LOW (ref >60)
Glucose, Bld: 161 mg/dL — ABNORMAL HIGH (ref 70–99)
Potassium: 3.8 mmol/L (ref 3.5–5.1)
Sodium: 146 mmol/L — ABNORMAL HIGH (ref 135–145)

## 2021-09-09 LAB — CBC
HCT: 27.7 % — ABNORMAL LOW (ref 36.0–46.0)
Hemoglobin: 8.3 g/dL — ABNORMAL LOW (ref 12.0–15.0)
MCH: 23.6 pg — ABNORMAL LOW (ref 26.0–34.0)
MCHC: 30 g/dL (ref 30.0–36.0)
MCV: 78.9 fL — ABNORMAL LOW (ref 80.0–100.0)
Platelets: 394 10*3/uL (ref 150–400)
RBC: 3.51 MIL/uL — ABNORMAL LOW (ref 3.87–5.11)
RDW: 14.5 % (ref 11.5–15.5)
WBC: 12 10*3/uL — ABNORMAL HIGH (ref 4.0–10.5)
nRBC: 0 % (ref 0.0–0.2)

## 2021-09-09 LAB — SURGICAL PATHOLOGY

## 2021-09-09 MED ORDER — HYDROMORPHONE HCL 2 MG PO TABS
2.0000 mg | ORAL_TABLET | ORAL | Status: DC | PRN
  Administered 2021-09-09: 2 mg via ORAL
  Filled 2021-09-09: qty 1

## 2021-09-09 MED ORDER — PROMETHAZINE HCL 25 MG PO TABS
25.0000 mg | ORAL_TABLET | Freq: Four times a day (QID) | ORAL | Status: DC | PRN
Start: 2021-09-09 — End: 2021-09-10

## 2021-09-09 MED ORDER — ACETAMINOPHEN 325 MG PO TABS
650.0000 mg | ORAL_TABLET | Freq: Four times a day (QID) | ORAL | Status: DC | PRN
  Administered 2021-09-10: 650 mg via ORAL
  Filled 2021-09-09: qty 2

## 2021-09-09 MED ORDER — METOCLOPRAMIDE HCL 10 MG PO TABS
10.0000 mg | ORAL_TABLET | Freq: Three times a day (TID) | ORAL | Status: DC
Start: 2021-09-09 — End: 2021-09-10
  Administered 2021-09-09 – 2021-09-10 (×4): 10 mg via ORAL
  Filled 2021-09-09 (×4): qty 1

## 2021-09-09 NOTE — Progress Notes (Signed)
Pt requested to use Cpap machine from home. RN called MD and NICU Respiratory Therapy for orders. Pt did not have some parts of home machine with her. Patient declined need to use hospital Cpap machine at this time.

## 2021-09-09 NOTE — Plan of Care (Signed)
  Problem: Coping: Goal: Level of anxiety will decrease Outcome: Completed/Met   Problem: Education: Goal: Knowledge of the prescribed therapeutic regimen will improve Outcome: Completed/Met

## 2021-09-09 NOTE — Progress Notes (Signed)
Observed waste of '27mg'$  (67m) Dilaudid PCA in stericycle with COdis Hollingshead RN.

## 2021-09-09 NOTE — Progress Notes (Signed)
1 Day Post-Op Procedure(s): ABDOMINAL MYOMECTOMY  Subjective: Patient reports tolerating PO and + flatus.    Objective: BP (!) 115/57 (BP Location: Right Arm)   Pulse 89   Temp 98.2 F (36.8 C) (Oral)   Resp 19   Ht '5\' 1"'$  (1.549 m)   Wt 120.7 kg   LMP 08/22/2021   SpO2 93%   BMI 50.26 kg/m   General: alert and cooperative Resp: clear to auscultation bilaterally Cardio: regular rate and rhythm GI: soft, non-tender; bowel sounds normal; no masses,  no organomegaly and incision: clean and dry Extremities: Homans sign is negative, no sign of DVT Vaginal Bleeding: minimal  Assessment: s/p Procedure(s): ABDOMINAL MYOMECTOMY: stable and tolerating diet  Plan: Encourage ambulation Advance to PO medication Discontinue IV fluids DC FOLEY DC PCA AMBULATE IN HALLS ANTICIPATE DC IN AM TOMORROW  LOS: 1 day    Annamary Buschman A Alcus Bradly 09/09/2021, 1:47 PM

## 2021-09-10 MED ORDER — FERROUS SULFATE 325 (65 FE) MG PO TBEC: 325.0000 mg | DELAYED_RELEASE_TABLET | Freq: Two times a day (BID) | ORAL | 11 refills | Status: DC

## 2021-09-10 MED ORDER — PROMETHAZINE HCL 25 MG PO TABS: 25.0000 mg | ORAL_TABLET | Freq: Four times a day (QID) | ORAL | 0 refills | Status: DC | PRN

## 2021-09-10 MED ORDER — ACETAMINOPHEN 325 MG PO TABS: 650.0000 mg | ORAL_TABLET | Freq: Four times a day (QID) | ORAL | 1 refills | Status: AC | PRN

## 2021-09-10 MED ORDER — METOCLOPRAMIDE HCL 10 MG PO TABS: 10.0000 mg | ORAL_TABLET | Freq: Three times a day (TID) | ORAL | 0 refills | Status: DC

## 2021-09-10 MED ORDER — OXYCODONE HCL 5 MG PO TABS
5.0000 mg | ORAL_TABLET | Freq: Four times a day (QID) | ORAL | 0 refills | Status: DC | PRN
Start: 2021-09-10 — End: 2021-10-04

## 2021-09-10 MED ORDER — POLYETHYLENE GLYCOL 3350 17 G PO PACK: 17.0000 g | PACK | Freq: Two times a day (BID) | ORAL | 0 refills | Status: DC

## 2021-09-10 NOTE — Discharge Summary (Signed)
Physician Discharge Summary  Patient ID: Tiffany Roth MRN: [id number] DOB/AGE: [birth year] 33 y.o.  Admit date: 09/08/2021 Discharge date: 09/10/2021  Admission Diagnoses:  Discharge Diagnoses:  Principal Problem:   S/P myomectomy   Discharged Condition: good  Hospital Course: PT underwent an abdominal myomectomy and ambulated, tolerated PO and passed flatus.  She has benefitted from her hospital stay and can go home  Consults: None  Significant Diagnostic Studies: none  Treatments: IV hydration and surgery: Abdominal myomecotmy  Discharge Exam: Blood pressure (!) 108/53, pulse (!) 108, temperature 98.4 F (36.9 C), temperature source Oral, resp. rate 18, height '5\' 1"'$  (1.549 m), weight 120.7 kg, last menstrual period 08/22/2021, SpO2 100 %. General appearance: alert and cooperative Head: Normocephalic, without obvious abnormality, atraumatic Resp: clear to auscultation bilaterally Cardio: regular rate and rhythm GI: soft, non-tender; bowel sounds normal; no masses,  no organomegaly and bandage CDI Pelvic: minimal VB Extremities: Homans sign is negative, no sign of DVT  Disposition: Discharge disposition: 01-Home or Self Care        Allergies as of 09/10/2021       Reactions   Dairycare [lactase-lactobacillus] Nausea And Vomiting   Dairy products   Ibuprofen Other (See Comments)   DR told her not to take   Other Nausea And Vomiting   DAIRY products        Medication List     TAKE these medications    acetaminophen 325 MG tablet Commonly known as: TYLENOL Take 2 tablets (650 mg total) by mouth every 6 (six) hours as needed for mild pain.   cetirizine 10 MG tablet Commonly known as: ZyrTEC Allergy Take 1 tablet (10 mg total) by mouth daily.   clotrimazole-betamethasone cream Commonly known as: LOTRISONE Apply 1 application. topically daily.   desmopressin 0.1 MG tablet Commonly known as: DDAVP Take 0.1 mg by mouth daily.   eucerin cream Apply 1  Application topically daily at 12 noon.   ferrous sulfate 325 (65 FE) MG EC tablet Take 1 tablet (325 mg total) by mouth 2 (two) times daily with a meal.   fluticasone 50 MCG/ACT nasal spray Commonly known as: FLONASE PLACE 1 SPRAY INTO BOTH NOSTRILS 2 (TWO) TIMES DAILY What changed: when to take this   hydrocortisone 2.5 % ointment Apply topically twice daily as need to red sandpapery rash.   hydrOXYzine 25 MG capsule Commonly known as: VISTARIL Take 1 capsule (25 mg total) by mouth every 8 (eight) hours as needed for itching. At night   ketoconazole 2 % shampoo Commonly known as: Nizoral Apply 1 application. topically 2 (two) times a week.   metoCLOPramide 10 MG tablet Commonly known as: REGLAN Take 1 tablet (10 mg total) by mouth 4 (four) times daily -  before meals and at bedtime for 5 days.   MULTIVITAMIN ADULT PO Take 1 tablet by mouth daily.   oxyCODONE 5 MG immediate release tablet Commonly known as: Oxy IR/ROXICODONE Take 1-2 tablets (5-10 mg total) by mouth every 6 (six) hours as needed for moderate pain.   Ozempic (0.25 or 0.5 MG/DOSE) 2 MG/1.5ML Sopn Generic drug: Semaglutide(0.25 or 0.'5MG'$ /DOS) Inject 0.25 mg into the skin once a week. For 4 weeks, then increase to 0.5 mg per week   pimecrolimus 1 % cream Commonly known as: Elidel Apply topically 2 (two) times daily.   polyethylene glycol 17 g packet Commonly known as: MIRALAX / GLYCOLAX Take 17 g by mouth 2 (two) times daily. Until bm is regular   promethazine  25 MG tablet Commonly known as: PHENERGAN Take 1 tablet (25 mg total) by mouth every 6 (six) hours as needed for nausea or vomiting.   triamcinolone cream 0.1 % Commonly known as: KENALOG Apply 1 application topically 2 (two) times daily as needed. Do not apply to face, groin or armpits What changed: reasons to take this        Follow-up Information     Arnella Pralle, MD Follow up in 1 week(s).   Specialty: Obstetrics and Gynecology Why:  [facility] visit Contact information: [address] [place] [address] [phone]                 Signed: Betsy Coder 09/10/2021, 11:46 AM

## 2021-09-10 NOTE — Progress Notes (Signed)
   09/10/21 1321  Departure Condition  Departure Condition Good  Mobility at [facility]  Patient/Caregiver Teaching Teach Back Method Used;Discharge instructions reviewed;Prescriptions reviewed;Follow-up care reviewed;Pain management discussed;Medications discussed;Patient/caregiver verbalized understanding  Departure Mode With family  Was procedural sedation performed on this patient during this visit? Yes   Patient alert and oriented x4, ambulatory, and VS and pain stable.

## 2021-09-10 NOTE — Plan of Care (Signed)
  Problem: Health Behavior/Discharge Planning: Goal: Ability to manage health-related needs will improve Outcome: Adequate for Discharge   Problem: Clinical Measurements: Goal: Ability to maintain clinical measurements within normal limits will improve Outcome: Adequate for Discharge Goal: Will remain free from infection Outcome: Adequate for Discharge Goal: Diagnostic test results will improve Outcome: Adequate for Discharge Goal: Respiratory complications will improve Outcome: Adequate for Discharge Goal: Cardiovascular complication will be avoided Outcome: Adequate for Discharge   Problem: Activity: Goal: Risk for activity intolerance will decrease Outcome: Adequate for Discharge   Problem: Nutrition: Goal: Adequate nutrition will be maintained Outcome: Adequate for Discharge   Problem: Elimination: Goal: Will not experience complications related to bowel motility Outcome: Adequate for Discharge Goal: Will not experience complications related to urinary retention Outcome: Adequate for Discharge   Problem: Pain Managment: Goal: General experience of comfort will improve Outcome: Adequate for Discharge   Problem: Safety: Goal: Ability to remain free from injury will improve Outcome: Adequate for Discharge   Problem: Skin Integrity: Goal: Risk for impaired skin integrity will decrease Outcome: Adequate for Discharge   Problem: Education: Goal: Understanding of sexual limitations or changes related to disease process or condition will improve Outcome: Adequate for Discharge Goal: Individualized Educational Video(s) Outcome: Adequate for Discharge   Problem: Self-Concept: Goal: Communication of feelings regarding changes in body function or appearance will improve Outcome: Adequate for Discharge   Problem: Skin Integrity: Goal: Demonstration of wound healing without infection will improve Outcome: Adequate for Discharge

## 2021-09-11 LAB — TYPE AND SCREEN
ABO/RH(D): O POS
Antibody Screen: NEGATIVE
Unit division: 0

## 2021-09-11 LAB — BPAM RBC
Blood Product Expiration Date: 202308032359
ISSUE DATE / TIME: 202307032003
Unit Type and Rh: 5100

## 2021-09-13 ENCOUNTER — Telehealth: Payer: Self-pay

## 2021-09-13 NOTE — Telephone Encounter (Signed)
Transition Care Management Follow-up Telephone Call Date of discharge and from where: 09/10/21 [facility] How have you been since you were released from the hospital? Feeling good Any questions or concerns? No  Items Reviewed: Did the pt receive and understand the discharge instructions provided? Yes  Medications obtained and verified? Yes  Other? No  Any new allergies since your discharge? No  Dietary orders reviewed? Yes Do you have support at home? Yes   Home Care and Equipment/Supplies: Were home health services ordered? no If so, what is the name of the agency? N/a  Has the agency set up a time to come to the patient's home? not applicable Were any new equipment or medical supplies ordered?  No What is the name of the medical supply agency? N/a Were you able to get the supplies/equipment? not applicable Do you have any questions related to the use of the equipment or supplies? No  Functional Questionnaire: (I = Independent and D = Dependent) ADLs: I  Bathing/Dressing- I  Meal Prep- I  Eating- I  Maintaining continence- I  Transferring/Ambulation- I  Managing Meds- I  Follow up appointments reviewed:  PCP Hospital f/u appt confirmed?  N/a  Scheduled to see N/a on N/a @ N/a. [facility] f/u appt confirmed? Yes  Scheduled to see Dr. Charlesetta Garibaldi GYN on 09/15/21. Are transportation arrangements needed? Yes  If their condition worsens, is the pt aware to call PCP or go to the Emergency Dept.? Yes Was the patient provided with contact information for the PCP's office or ED? Yes Was to pt encouraged to call back with questions or concerns? Yes No further care management needs Peter Garter RN, [facility], [place] Management [phone]

## 2021-09-13 NOTE — Telephone Encounter (Signed)
Transition Care Management Unsuccessful Follow-up Telephone Call  Date of discharge and from where:  [facility]/11/21 [facility]  Attempts:  1st Attempt  Reason for unsuccessful TCM follow-up call:  Left voice message Peter Garter RN, [facility], [place] Management [phone]

## 2021-10-01 NOTE — Progress Notes (Unsigned)
HPI: Tiffany Roth is a 31 y.o. female, who is here today to establish care.  Former PCP: Dr. Regis Bill Last preventive routine visit: 11/2019 gyn exam.  Chronic medical problems: Eczema, diabetes insipidus,OSA, s/p laparoscopic gastric sleeve resection,and DI among some.  Diabetes insipidus: She follows with endocrinologist prn, planning on arranging appt with Dr Clover Mealy. She is on Desmopressin 0.1 mg daily.  Lab Results  Component Value Date   CREATININE 1.39 (H) 09/09/2021   BUN 12 09/09/2021   NA 146 (H) 09/09/2021   K 3.8 09/09/2021   CL 112 (H) 09/09/2021   CO2 21 (L) 09/09/2021   HgA1C 5.9 in 01/2021. She has not been consistent with following a healthful diet or exercising regularly. Negative for abdominal pain, nausea,vomiting, polydipsia,polyuria, or polyphagia.  Eczema: She has been referred, did not keep appt, she is planning on re-scheduling.  Iron def anemia: She is on Fe sulfate 325 mg daily. Heavy menses, s/p myomectomy 09/2021. LMP 09/17/21, not as heavy.  Noted mild tachycardia today. She has not noted chest pain, dyspnea, palpitation, or edema.  Component     Latest Ref Rng 09/09/2021  WBC     4.0 - 10.5 K/uL 12.0 (H)   RBC     3.87 - 5.11 Mil/uL 3.51 (L)   Hemoglobin     12.0 - 15.0 g/dL 8.3 (L)   HCT     36.0 - 46.0 % 27.7 (L)   MCV     78.0 - 100.0 fl 78.9 (L)   MCHC     30.0 - 36.0 g/dL 30.0   RDW     11.5 - 15.5 % 14.5   Platelets     150.0 - 400.0 K/uL 394    Review of Systems  Constitutional:  Negative for activity change, appetite change and fever.  HENT:  Negative for mouth sores, nosebleeds and trouble swallowing.   Eyes:  Negative for redness and visual disturbance.  Respiratory:  Negative for cough and wheezing.   Gastrointestinal:        Negative for changes in bowel habits.  Genitourinary:  Negative for decreased urine volume and hematuria.  Musculoskeletal:  Negative for gait problem and myalgias.  Skin:  Positive  for rash.  Allergic/Immunologic: Positive for environmental allergies.  Neurological:  Negative for syncope, weakness and headaches.  Rest see pertinent positives and negatives per HPI.  Current Outpatient Medications on File Prior to Visit  Medication Sig Dispense Refill   acetaminophen (TYLENOL) 325 MG tablet Take 2 tablets (650 mg total) by mouth every 6 (six) hours as needed for mild pain. 30 tablet 1   cetirizine (ZYRTEC ALLERGY) 10 MG tablet Take 1 tablet (10 mg total) by mouth daily. 90 tablet 3   clotrimazole-betamethasone (LOTRISONE) cream Apply 1 application. topically daily. 45 g 0   desmopressin (DDAVP) 0.1 MG tablet Take 0.1 mg by mouth daily.     ferrous sulfate 325 (65 FE) MG EC tablet Take 1 tablet (325 mg total) by mouth 2 (two) times daily with a meal. 90 tablet 11   hydrocortisone 2.5 % ointment Apply topically twice daily as need to red sandpapery rash. 30 g 0   hydrOXYzine (VISTARIL) 25 MG capsule Take 1 capsule (25 mg total) by mouth every 8 (eight) hours as needed for itching. At night 30 capsule 0   ketoconazole (NIZORAL) 2 % shampoo Apply 1 application. topically 2 (two) times a week. 120 mL 0   Multiple Vitamin (MULTIVITAMIN ADULT PO) Take  1 tablet by mouth daily.     pimecrolimus (ELIDEL) 1 % cream Apply topically 2 (two) times daily. 30 g 0   polyethylene glycol (MIRALAX / GLYCOLAX) 17 g packet Take 17 g by mouth 2 (two) times daily. Until bm is regular 14 each 0   Skin Protectants, Misc. (EUCERIN) cream Apply 1 Application topically daily at 12 noon.     triamcinolone cream (KENALOG) 0.1 % Apply 1 application topically 2 (two) times daily as needed. Do not apply to face, groin or armpits (Patient taking differently: Apply 1 application  topically 2 (two) times daily as needed (Eczema). Do not apply to face, groin or armpits) 453 g 3   fluticasone (FLONASE) 50 MCG/ACT nasal spray PLACE 1 SPRAY INTO BOTH NOSTRILS 2 (TWO) TIMES DAILY (Patient taking differently: Place 1  spray into both nostrils daily.) 16 mL 3   No current facility-administered medications on file prior to visit.   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, was on thyroid replacement as a child   Prematurity    "3 months" 1# 15 oz" ventilator    Sinus infection    recently diagnosed currently on antibiotic will have completed dosage prior to surgery    Sleep apnea    uses CPAP    Varicose veins    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   Allergies  Allergen Reactions   Dairycare [Lactase-Lactobacillus] Nausea And Vomiting    Dairy products   Ibuprofen Other (See Comments)    DR told her not to take   Other Nausea And Vomiting    DAIRY products   Family History  Problem Relation Age of Onset   Thyroid disease Mother    Hypertension Mother    Social History   Socioeconomic History   Marital status: Single    Spouse name: Not on file   Number of children: Not on file   Years of education: Not on file   Highest education level: Not on file  Occupational History   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    Doing much better less stress   Going to grad school in august .  [place] in [employer] education .    Social Determinants of Health    Financial Resource Strain: Not on file  Food Insecurity: Not on file  Transportation Needs: Not on file  Physical Activity: Not on file  Stress: Not on file  Social Connections: Not on file   Vitals:   10/04/21 1502  BP: 126/80  Pulse: (!) 117  Resp: 12  SpO2: 97%   Body mass index is 49.5 kg/m.  Physical Exam Vitals and nursing note reviewed.  Constitutional:      General: She is not in acute distress.    Appearance: She is well-developed.  HENT:     Head: Normocephalic and atraumatic.     Mouth/Throat:     Mouth: Mucous membranes are moist.     Pharynx: Oropharynx is clear.  Eyes:     Conjunctiva/sclera: Conjunctivae normal.  Cardiovascular:     Rate and Rhythm: Regular rhythm. Tachycardia present.     Pulses:          Dorsalis pedis pulses are 2+ on the right side and 2+ on the left side.     Heart sounds: No murmur heard. Pulmonary:     Effort: Pulmonary effort is normal. No respiratory distress.     Breath sounds: Normal breath sounds.  Abdominal:     Palpations: Abdomen is soft. There is no hepatomegaly or mass.     Tenderness: There is no abdominal tenderness.  Lymphadenopathy:     Cervical: No cervical adenopathy.  Skin:    General: Skin is warm and dry.     Findings: Rash (eczema:Hands) present. No erythema.  Neurological:     General: No focal deficit present.     Mental Status: She is alert and oriented to person, place, and time.     Cranial Nerves: No cranial nerve deficit.     Gait: Gait normal.  Psychiatric:     Comments: Well groomed, good eye contact.   ASSESSMENT AND PLAN:  Tiffany Roth was seen today for establish care.  Diagnoses and all orders for this visit: Orders Placed This Encounter  Procedures   Basic metabolic panel   Hemoglobin A1c   TSH   CBC   Lab Results  Component Value Date   WBC 9.6 10/04/2021   HGB 9.6 (L) 10/04/2021   HCT 31.3 (L) 10/04/2021   MCV 77.8 (L) 10/04/2021   PLT 489.0 (H) 10/04/2021   Lab  Results  Component Value Date   TSH 2.40 10/04/2021   Lab Results  Component Value Date   CREATININE 1.56 (H) 10/04/2021   BUN 17 10/04/2021   NA 138 10/04/2021   K 3.7 10/04/2021   CL 107 10/04/2021   CO2 19 10/04/2021   Lab Results  Component Value Date   HGBA1C 6.1 10/04/2021   Prediabetes Encouraged to be consistent with a healthy life style for diabetes prevention. Further recommendations according to HgA1C result.  Iron deficiency anemia due to chronic blood loss DUB due to fibroids, s/p myomectomy. Continue Fe Sulfate 325 mg daily. CBC ordered today.  Diabetes insipidus (H[facility]) Continue Desmopressin 0.1 mg daily. She is going to arrange f/u appt with her endocrinologist.  Sinus tachycardia Asymptomatic. Instructed to monitor HR regularly. Further recommendations according to lab results. I do not think EKG is needed today.  Return in about 5 months (around 03/06/2022), or if symptoms worsen or fail to improve, for cpe.  Jevonte Clanton G. Martinique, MD  [facility] [place] office.

## 2021-10-04 ENCOUNTER — Encounter: Payer: Self-pay | Admitting: Family Medicine

## 2021-10-04 ENCOUNTER — Ambulatory Visit: Payer: BC Managed Care – PPO | Admitting: Family Medicine

## 2021-10-04 VITALS — BP 126/80 | HR 117 | Resp 12 | Ht 61.0 in | Wt 262.0 lb

## 2021-10-04 DIAGNOSIS — R7303 Prediabetes: Secondary | ICD-10-CM

## 2021-10-04 DIAGNOSIS — D500 Iron deficiency anemia secondary to blood loss (chronic): Secondary | ICD-10-CM

## 2021-10-04 DIAGNOSIS — D509 Iron deficiency anemia, unspecified: Secondary | ICD-10-CM | POA: Insufficient documentation

## 2021-10-04 DIAGNOSIS — R000 Tachycardia, unspecified: Secondary | ICD-10-CM | POA: Diagnosis not present

## 2021-10-04 DIAGNOSIS — E232 Diabetes insipidus: Secondary | ICD-10-CM | POA: Diagnosis not present

## 2021-10-04 NOTE — Patient Instructions (Addendum)
A few things to remember from today's visit:  Diabetes insipidus ([place])  Iron deficiency anemia due to chronic blood loss - Plan: CBC  Sinus tachycardia - Plan: Basic metabolic panel, TSH  Prediabetes - Plan: Hemoglobin A1c  If you need refills please call your pharmacy. Do not use My Chart to request refills or for acute issues that need immediate attention.   Arrange appt with dermatologist and endocrinologist.  Please be sure medication list is accurate. If a new problem present, please set up appointment sooner than planned today.  MyPlate from [place] is an outline of a general healthy diet based on the Dietary Guidelines for Americans, 2020-2025, from the U.S. Department of Agriculture Scientist, research (physical sciences)). It sets guidelines for how much food you should eat from each food group based on your age, sex, and level of physical activity. What are tips for following MyPlate? To follow MyPlate recommendations: Eat a wide variety of fruits and vegetables, grains, and protein foods. Serve smaller portions and eat less food throughout the day. Limit portion sizes to avoid overeating. Enjoy your food. Get at least 150 minutes of exercise every week. This is about 30 minutes each day, 5 or more days per week. It can be difficult to have every meal look like MyPlate. Think about MyPlate as eating guidelines for an entire day, rather than each individual meal. Fruits and vegetables Make one half of your plate fruits and vegetables. Eat many different colors of fruits and vegetables each day. For a 2,000-calorie daily food plan, eat: 2 cups of vegetables every day. 2 cups of fruit every day. 1 cup is equal to: 1 cup raw or cooked vegetables. 1 cup raw fruit. 1 medium-sized orange, apple, or banana. 1 cup 100% fruit or vegetable juice. 2 cups raw leafy greens, such as lettuce, spinach, or kale.  cup dried fruit. Grains One fourth of your plate should be grains. Make at least half of the  grains you eat each day whole grains. For a 2,000-calorie daily food plan, eat 6 oz of grains every day. 1 oz is equal to: 1 slice bread. 1 cup cereal.  cup cooked rice, cereal, or pasta. Protein One fourth of your plate should be protein. Eat a wide variety of protein foods, including meat, poultry, fish, eggs, beans, nuts, and tofu. For a 2,000-calorie daily food plan, eat 5 oz of protein every day. 1 oz is equal to: 1 oz meat, poultry, or fish.  cup cooked beans. 1 egg.  oz nuts or seeds. 1 Tbsp peanut butter. Dairy Drink fat-free or low-fat (1%) milk. Eat or drink dairy as a side to meals. For a 2,000-calorie daily food plan, eat or drink 3 cups of dairy every day. 1 cup is equal to: 1 cup milk, yogurt, cottage cheese, or soy milk (soy beverage). 2 oz processed cheese. 1 oz natural cheese. Fats, oils, salt, and sugars Only small amounts of oils are recommended. Avoid foods that are high in calories and low in nutritional value (empty calories), like foods high in fat or added sugars. Choose foods that are low in salt (sodium). Choose foods that have less than 140 milligrams (mg) of sodium per serving. Drink water instead of sugary drinks. Drink enough fluid to keep your urine pale yellow. Where to find support Work with your health care provider or a dietitian to develop a customized eating plan that is right for you. Download an app (mobile application) to help you track your daily food intake. Where  to find more information USDA: [employer] Summary MyPlate is a general guideline for healthy eating from the USDA. It is based on the Dietary Guidelines for Americans, 2020-2025. In general, fruits and vegetables should take up one half of your plate, grains should take up one fourth of your plate, and protein should take up one fourth of your plate. This information is not intended to replace advice given to you by your health care provider. Make sure you discuss  any questions you have with your health care provider. Document Revised: 01/13/2020 Document Reviewed: 01/13/2020 Elsevier Patient Education  [place].

## 2021-10-05 LAB — CBC
HCT: 31.3 % — ABNORMAL LOW (ref 36.0–46.0)
Hemoglobin: 9.6 g/dL — ABNORMAL LOW (ref 12.0–15.0)
MCHC: 30.8 g/dL (ref 30.0–36.0)
MCV: 77.8 fl — ABNORMAL LOW (ref 78.0–100.0)
Platelets: 489 10*3/uL — ABNORMAL HIGH (ref 150.0–400.0)
RBC: 4.02 Mil/uL (ref 3.87–5.11)
RDW: 17.7 % — ABNORMAL HIGH (ref 11.5–15.5)
WBC: 9.6 10*3/uL (ref 4.0–10.5)

## 2021-10-05 LAB — BASIC METABOLIC PANEL
BUN: 17 mg/dL (ref 6–23)
CO2: 19 mEq/L (ref 19–32)
Calcium: 9.6 mg/dL (ref 8.4–10.5)
Chloride: 107 mEq/L (ref 96–112)
Creatinine, Ser: 1.56 mg/dL — ABNORMAL HIGH (ref 0.40–1.20)
GFR: 43.55 mL/min — ABNORMAL LOW (ref >60.00)
Glucose, Bld: 133 mg/dL — ABNORMAL HIGH (ref 70–99)
Potassium: 3.7 mEq/L (ref 3.5–5.1)
Sodium: 138 mEq/L (ref 135–145)

## 2021-10-05 LAB — TSH: TSH: 2.4 u[IU]/mL (ref 0.35–5.50)

## 2021-10-05 LAB — HEMOGLOBIN A1C: Hgb A1c MFr Bld: 6.1 % (ref 4.6–6.5)

## 2022-01-12 ENCOUNTER — Encounter: Payer: Self-pay | Admitting: Family Medicine

## 2022-01-12 ENCOUNTER — Telehealth (INDEPENDENT_AMBULATORY_CARE_PROVIDER_SITE_OTHER): Payer: BC Managed Care – PPO | Admitting: Family Medicine

## 2022-01-12 VITALS — Ht 61.0 in

## 2022-01-12 DIAGNOSIS — E232 Diabetes insipidus: Secondary | ICD-10-CM

## 2022-01-12 DIAGNOSIS — J310 Chronic rhinitis: Secondary | ICD-10-CM | POA: Diagnosis not present

## 2022-01-12 DIAGNOSIS — R0981 Nasal congestion: Secondary | ICD-10-CM

## 2022-01-12 MED ORDER — FLUTICASONE PROPIONATE 50 MCG/ACT NA SUSP: 1.0000 | Freq: Every day | NASAL | 2 refills | Status: DC | PRN

## 2022-01-12 MED ORDER — PREDNISONE 20 MG PO TABS: 40.0000 mg | ORAL_TABLET | Freq: Every day | ORAL | 0 refills | Status: AC

## 2022-01-12 MED ORDER — AMOXICILLIN-POT CLAVULANATE 875-125 MG PO TABS: 1.0000 | ORAL_TABLET | Freq: Two times a day (BID) | ORAL | 0 refills | Status: AC

## 2022-01-12 NOTE — Progress Notes (Signed)
Virtual Visit via Video Note I connected with Tiffany Roth on 01/12/22 by a video enabled telemedicine application and verified that I am speaking with the correct person using two identifiers.  Location patient: home Location provider:work ffice Persons participating in the virtual visit: patient, provider  I discussed the limitations of evaluation and management by telemedicine and the availability of in person appointments. The patient expressed understanding and agreed to proceed.  Chief Complaint  Patient presents with   Nasal Congestion    X 2 weeks   Headache    X 2 weeks   Sore Throat    X 2 weeks   HPI: [name] is a 31 year old female with history of diabetes insipidus, eczema, chronic headaches, allergy rhinitis, OSA, hypothyroidism, and prediabetes complaining of upper respiratory symptoms. 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 She experienced a fever of 100.2 F for one day at the onset of her symptoms. She has tested negative for COVID-19.  She describes having a cough, mainly to clear mucus, and postnasal drainage. She reports experiencing headaches in the temporal and facial pain, as well as nasal congestion and yellowish/greenish rhinorrhea.  Negative for epistaxis.  She has been taking Tylenol, Airborne, and today started with Dayquil. States that her symptoms have improved today after taking Dayquil Negative for visual changes, CP, palpitation, dyspnea, wheezing, abdominal pain, nausea, vomiting, changes in bowel habits, body aches, unusual skin rash (history of eczema), or urinary symptoms. She is concerned about medication being prescribed today because of her history of diabetes insipidus.  Last e GFR was 43.  Lab Results  Component Value Date   CREATININE 1.56 (H) 10/04/2021   BUN 17 10/04/2021   NA 138 10/04/2021   K 3.7 10/04/2021   CL 107 10/04/2021   CO2 19 10/04/2021   ROS: See  pertinent positives and negatives per HPI.  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, was on thyroid replacement as a child   Prematurity    "3 months" 1# 15 oz" ventilator    Sinus infection    recently diagnosed currently on antibiotic will have completed dosage prior to surgery    Sleep apnea    uses CPAP    Varicose veins    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    Past Surgical History:  Procedure Laterality Date   LAPAROSCOPIC GASTRIC SLEEVE RESECTION N/A 06/02/2014   Procedure: LAPAROSCOPIC GASTRIC SLEEVE RESECTION;  Surgeon: Greer Pickerel, MD;  Location: [facility];  Service: General;  Laterality: N/A;   MYOMECTOMY N/A 09/08/2021   Procedure: ABDOMINAL MYOMECTOMY;  Surgeon: Crawford Givens, MD;  Location: [facility];  Service: Gynecology;  Laterality: N/A;   TYMPANOSTOMY TUBE PLACEMENT     childhood   UPPER GI ENDOSCOPY N/A 06/02/2014   Procedure: UPPER GI ENDOSCOPY;  Surgeon: Greer Pickerel, MD;  Location: [facility];  Service: General;  Laterality: N/A;   varicose veins      surgically repaired 2011    Family History  Problem Relation Age of Onset   Thyroid disease Mother    Hypertension  Mother    Social History   Socioeconomic History   Marital status: Single    Spouse name: Not on file   Number of children: Not on file   Years of education: Not on file   Highest education level: Not on file  Occupational History   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    Doing much better less stress   Going to grad school in august .  [place] in [employer] education .    Social Determinants of Health   Financial Resource Strain: Not on file  Food Insecurity: Not on file  Transportation Needs: Not on file  Physical Activity: Not on file  Stress: Not on file  Social Connections: Not on file  Intimate Partner Violence: Not on file   Current Outpatient Medications:    acetaminophen (TYLENOL) 325 MG tablet, Take 2 tablets (650 mg total) by mouth every 6 (six) hours as needed for mild pain., Disp: 30 tablet, Rfl: 1   amoxicillin-clavulanate (AUGMENTIN) 875-125 MG tablet, Take 1 tablet by mouth 2 (two) times daily for 7 days., Disp: 14 tablet, Rfl: 0   cetirizine (ZYRTEC ALLERGY) 10 MG tablet, Take 1 tablet (10 mg total) by mouth daily., Disp: 90 tablet, Rfl: 3   clotrimazole-betamethasone (LOTRISONE) cream, Apply 1 application. topically daily., Disp: 45 g, Rfl: 0   desmopressin (DDAVP) 0.1 MG tablet, Take 0.1 mg by mouth daily., Disp: , Rfl:    ferrous sulfate 325 (65 FE) MG EC tablet, Take 1 tablet (325 mg total) by mouth 2 (two) times daily with a meal., Disp: 90 tablet, Rfl: 11   hydrocortisone 2.5 % ointment, Apply topically twice daily as need to red sandpapery rash., Disp: 30 g, Rfl: 0   hydrOXYzine (VISTARIL) 25 MG capsule, Take 1 capsule (25 mg total) by mouth every 8 (eight) hours as needed for itching. At night, Disp: 30 capsule, Rfl: 0   ketoconazole (NIZORAL) 2 % shampoo, Apply 1 application. topically 2 (two) times a week., Disp: 120 mL, Rfl: 0   Multiple Vitamin (MULTIVITAMIN ADULT PO), Take 1 tablet by mouth daily., Disp: , Rfl:    pimecrolimus (ELIDEL) 1 % cream, Apply topically 2 (two) times daily., Disp: 30 g, Rfl: 0   polyethylene  glycol (MIRALAX / GLYCOLAX) 17 g packet, Take 17 g by mouth 2 (two) times daily. Until bm is regular, Disp: 14 each, Rfl: 0   predniSONE (DELTASONE) 20 MG tablet, Take 2 tablets (40 mg total) by mouth daily with breakfast for 3 days., Disp: 6 tablet, Rfl: 0   Skin Protectants, Misc. (EUCERIN) cream, Apply 1 Application topically daily at 12 noon., Disp: , Rfl:    triamcinolone cream (KENALOG) 0.1 %, Apply 1 application topically 2 (two) times daily as needed. Do not apply to face, groin or armpits (Patient taking differently: Apply 1 application  topically 2 (two) times daily as needed (Eczema). Do not apply to face, groin or  armpits), Disp: 453 g, Rfl: 3   fluticasone (FLONASE) 50 MCG/ACT nasal spray, Place 1 spray into both nostrils daily as needed., Disp: 16 g, Rfl: 2  EXAM:  VITALS per patient if applicable:Ht '5\' 1"'$  (1.549 m)   BMI 49.50 kg/m   GENERAL: alert, oriented, appears well and in no acute distress  HEENT: atraumatic, conjunctiva clear, no obvious abnormalities on inspection of external nose and ears. Nasal voice. Mild pain when applying pressure on maxillary sinuses. No edema or erythema appreciated. NECK: normal movements of the head and neck  LUNGS: on inspection no signs of respiratory distress, breathing rate appears normal, no obvious gross SOB, gasping or wheezing  CV: no obvious cyanosis  MS: moves all visible extremities without noticeable abnormality  PSYCH/NEURO: pleasant and cooperative, no obvious depression or anxiety, speech and thought processing grossly intact  ASSESSMENT AND PLAN:  Discussed the following assessment and plan:  Nasal sinus congestion - Plan: predniSONE (DELTASONE) 20 MG tablet We discussed possible etiologies. Residual symptoms after viral URI. History does not suggest an ongoing bacterial infectious process, symptoms improved today after starting DayQuil. Recommend continue DayQuil daily for a few days. She has taken prednisone in  the past, we discussed some side effects. Prednisone 20 mg orally once daily for three days to help with congestion, instructed to take it with breakfast.  I do not think antibiotic is needed at this time, instructed to start it if she is not feeling any better or symptoms get worse. Augmentin 875-125 mg to be filled per request at the pharmacy and to take bid x 7 if started. Recommend holding on the antibiotic x 3-4 days unless fever develops before.  Rhinitis, unspecified type - Plan: predniSONE (DELTASONE) 20 MG tablet, fluticasone (FLONASE) 50 MCG/ACT nasal spray Prednisone will help. Continue Flonase nasal spray in each nostril at night for 10 days, then as needed. Nasal saline irrigations throughout the day as needed. Consider plain Mucinex to help with mucus viscosity.  Diabetes insipidus ([place]) We discussed side effects of medications. There is not any absolute contraindication for medications prescribed today. No renal adjustment needed. Continue following with endocrinologist.   We discussed possible serious and likely etiologies, options for evaluation and workup, limitations of telemedicine visit vs in person visit, treatment, treatment risks and precautions. The patient was advised to call back or seek an in-person evaluation if the symptoms worsen or if the condition fails to improve as anticipated. I discussed the assessment and treatment plan with the patient. The patient was provided an opportunity to ask questions and all were answered. The patient agreed with the plan and demonstrated an understanding of the instructions.  Return if symptoms worsen or fail to improve.  Taras Rask G. [place], MD  [facility]. [place] office.

## 2022-02-24 ENCOUNTER — Encounter: Payer: Self-pay | Admitting: Family Medicine

## 2022-02-25 ENCOUNTER — Encounter: Payer: Self-pay | Admitting: Family Medicine

## 2022-02-25 DIAGNOSIS — Z9884 Bariatric surgery status: Secondary | ICD-10-CM

## 2022-03-18 NOTE — Progress Notes (Signed)
ACUTE VISIT Chief Complaint  Patient presents with   Fatigue    Low energy x 2 weeks   HPI: [name] is a 32 y.o. 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 Problem has been worse for the past few months.  She denies any recent fever,chills, or new stressors. She sleeps for more than nine hours on average and uses a CPAP machine but still experiences fatigue. She is unsure of her next appointment with her sleep specialist. She denies any mood disturbances, such as depression or anxiety.  Iron def anemia, currently taking over-the-counter iron supplements, but she is unsure if the dosage is strong enough. Her last menstrual period was at the beginning of this month, and she reports that her periods are no longer heavy since her myomectomy on 09/08/2021.  Lab Results  Component Value Date   WBC 9.6 10/04/2021   HGB 9.6 (L) 10/04/2021   HCT 31.3 (L) 10/04/2021   MCV 77.8 (L) 10/04/2021   PLT 489.0 (H) 10/04/2021   She reports that her mother mentioned her thyroid was "messed up" when she was a baby. She was on thyroid medication during childhood. She is not following with endocrinologist. Lab Results  Component Value Date   TSH 2.40 10/04/2021   Diabetes insipidus, she follows with nephrologist.  Lab Results  Component Value Date   CREATININE 1.56 (H) 10/04/2021   BUN 17 10/04/2021   NA 138 10/04/2021   K 3.7 10/04/2021   CL 107 10/04/2021   CO2 19 10/04/2021   Lab Results  Component Value Date   ALT 15 01/27/2021   AST 22 01/27/2021   ALKPHOS 92 01/27/2021   BILITOT 0.7 01/27/2021   Prediabetes:Negative for polydipsia,polyuria, or polyphagia.  Lab Results  Component Value Date   HGBA1C 6.1 10/04/2021   Lab Results  Component Value Date   [id number] 505 01/27/2021   Severe eczema: She thinks she has a "skin infection." She mentions that her skin is red and at  times oozing in various areas. She has not seen a dermatologist recently and had canceled a coming appointment in July/2024.She would like to see a different provider. She has tried Dupixent, which worsened her skin condition, causing it to break out and weep.  She exercises regularly, doing StairMaster, cardio, and weightlifting three times per week. She is trying to maintain a healthy diet and avoid excessive sweets and carbohydrates. She is planning on attending a weight loss clinic at [facility].  S/P laparoscopic sleeve gastrectomy.  Review of Systems  Constitutional:  Positive for fatigue. Negative for chills and fever.  HENT:  Negative for mouth sores, nosebleeds and sore throat.   Respiratory:  Negative for cough and wheezing.   Gastrointestinal:  Negative for abdominal pain, nausea and vomiting.  Allergic/Immunologic: Positive for environmental allergies.  Neurological:  Negative for syncope and facial asymmetry.  Psychiatric/Behavioral:  Negative for confusion. The patient is nervous/anxious.   See other pertinent positives and negatives in HPI.  Current Outpatient Medications on File Prior to Visit  Medication Sig Dispense Refill   acetaminophen (TYLENOL) 325 MG tablet Take 2 tablets (650 mg total) by mouth every 6 (six) hours as needed for mild pain. 30 tablet 1   cetirizine (ZYRTEC ALLERGY) 10 MG tablet Take 1 tablet (10 mg total) by mouth daily. 90 tablet 3   clotrimazole-betamethasone (LOTRISONE) cream Apply 1 application. topically daily. 45 g 0  desmopressin (DDAVP) 0.1 MG tablet Take 0.1 mg by mouth daily.     ferrous sulfate 325 (65 FE) MG EC tablet Take 1 tablet (325 mg total) by mouth 2 (two) times daily with a meal. 90 tablet 11   fluticasone (FLONASE) 50 MCG/ACT nasal spray Place 1 spray into both nostrils daily as needed. 16 g 2   hydrocortisone 2.5 % ointment Apply topically twice daily as need to red sandpapery rash. 30 g 0   hydrOXYzine (VISTARIL) 25 MG capsule Take 1  capsule (25 mg total) by mouth every 8 (eight) hours as needed for itching. At night 30 capsule 0   ketoconazole (NIZORAL) 2 % shampoo Apply 1 application. topically 2 (two) times a week. 120 mL 0   Multiple Vitamin (MULTIVITAMIN ADULT PO) Take 1 tablet by mouth daily.     pimecrolimus (ELIDEL) 1 % cream Apply topically 2 (two) times daily. 30 g 0   polyethylene glycol (MIRALAX / GLYCOLAX) 17 g packet Take 17 g by mouth 2 (two) times daily. Until bm is regular 14 each 0   Skin Protectants, Misc. (EUCERIN) cream Apply 1 Application topically daily at 12 noon.     triamcinolone cream (KENALOG) 0.1 % Apply 1 application topically 2 (two) times daily as needed. Do not apply to face, groin or armpits (Patient taking differently: Apply 1 application  topically 2 (two) times daily as needed (Eczema). Do not apply to face, groin or armpits) 453 g 3   No current facility-administered medications on file prior to visit.   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, was on thyroid replacement as a child   Prematurity    "3 months" 1# 15 oz" ventilator    Sinus infection    recently diagnosed currently on antibiotic will have completed dosage prior to surgery    Sleep apnea    uses CPAP    Varicose veins    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   Allergies  Allergen Reactions   Dairycare [Lactase-Lactobacillus] Nausea And Vomiting    Dairy products   Ibuprofen Other (See Comments)    DR told her not to take   Other Nausea And Vomiting    DAIRY products   Social History   Socioeconomic History   Marital status: Single    Spouse name: Not on file   Number of children: Not on file   Years of education: Not on  file   Highest education level: Not on file  Occupational History   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  Doing much better less stress   Going to grad school in august .  [place] in [employer] education .    Social Determinants of Health   Financial Resource Strain: Not on file  Food Insecurity: Not on file  Transportation Needs: Not on file  Physical Activity: Not on file  Stress: Not on file  Social Connections: Not on file   Today's Vitals   03/21/22 1133  BP: 126/84  Pulse: 100  Resp: 12  Temp: 98.2 F (36.8 C)  TempSrc: Oral  SpO2: 99%  Weight: 264 lb (119.7 kg)  Height: '5\' 1"'$  (1.549 m)   Body mass index is 49.88 kg/m.  Wt Readings from Last 3 Encounters:  03/21/22 264 lb (119.7 kg)  10/04/21 262 lb (118.8 kg)  09/08/21 266 lb (120.7 kg)   Physical Exam Vitals and nursing note reviewed.  Constitutional:      General: She is not in acute distress.    Appearance: She is well-developed.  HENT:     Head: Normocephalic and atraumatic.     Mouth/Throat:     Mouth: Mucous membranes are moist.     Pharynx: Oropharynx is clear.  Eyes:     Conjunctiva/sclera: Conjunctivae normal.  Cardiovascular:     Rate and Rhythm: Normal rate and regular rhythm.     Heart sounds: No murmur heard. Pulmonary:     Effort: Pulmonary effort is normal. No respiratory distress.     Breath sounds: Normal breath sounds.  Abdominal:     Palpations: Abdomen is soft. There is no hepatomegaly or mass.     Tenderness: There is no abdominal  tenderness.  Lymphadenopathy:     Cervical: Cervical adenopathy (Enlarged lymp nodes, bilateral, not tender, < 1 cm) present.  Skin:    General: Skin is warm and dry.     Findings: Rash present. Rash is papular.          Comments: Micropapular, non erythematous rash scattered on upper chest, abdomen,back,neck,and extremities. Hyperpigmentation post inflammatory changes. Scratching marks on upper check, around ears, scalp and lower back.  Neurological:     General: No focal deficit present.     Mental Status: She is alert and oriented to person, place, and time.     Cranial Nerves: No cranial nerve deficit.     Gait: Gait normal.  Psychiatric:        Mood and Affect: Mood is anxious.   ASSESSMENT AND PLAN:  Ms Tamala was seen today for fatigue.  Diagnoses and all orders for this visit: Lab Results  Component Value Date   WBC 8.3 03/21/2022   HGB 13.6 03/21/2022   HCT 42.5 03/21/2022   MCV 89.6 03/21/2022   PLT 380.0 03/21/2022   Lab Results  Component Value Date   TSH 2.13 03/21/2022   Lab Results  Component Value Date   VITAMINB12 676 03/21/2022   Lab Results  Component Value Date   CREATININE 1.18 03/21/2022   BUN 9 03/21/2022   NA 142 03/21/2022   K 3.8 03/21/2022   CL 109 03/21/2022   CO2 23 03/21/2022   Lab Results  Component Value Date   ALT 28 03/21/2022   AST 33 03/21/2022   ALKPHOS 110 03/21/2022   BILITOT 1.0 03/21/2022   Lab Results  Component Value Date   HGBA1C 6.0 03/21/2022   OSA (obstructive sleep apnea) Assessment & Plan: Wearing CPAP. This problem could be contributing to her fatigue. Instructed to arrange a f/u appt with sleep apnea  provider.   Hypothyroidism, unspecified type Assessment & Plan: She is not longer on thyroid hormone replacement. Further recommendations according to TSH and free T4 results.  Orders: -     TSH; Future -     T4, free; Future  Diabetes insipidus [facility]) Assessment & Plan: She follows with  nephrologist, has an appt already scheduled.   Iron deficiency anemia due to chronic blood loss Assessment & Plan: S/P myomectomy in 09/2021, which has helped with menstrual flow. CBC and iron studies ordered today. Continue OTC iron supplementation.  Orders: -     CBC; Future -     Iron -     Ferritin  S/P laparoscopic sleeve gastrectomy Assessment & Plan: B12 and 25 OH vit D ordered today. Continue OTC vit supplements.  Orders: -     Vitamin B12; Future -     VITAMIN D 25 Hydroxy (Vit-D Deficiency, Fractures); Future  Fatigue, unspecified type Assessment & Plan: We discussed possible etiologies: Systemic illness, immunologic,endocrinology,sleep disorder, psychiatric/psychologic, infectious,medications side effects, and idiopathic. Examination today does not suggest a serious process but a few of her chronic medical conditions can be contributing factors. Further recommendations will be given according to lab results.  Orders: -     Comprehensive metabolic panel; Future -     CBC; Future  Prediabetes Assessment & Plan: HgA1C was 6.1 in 09/2021. Consistency with a healthy life style encouraged for diabetes prevention.  Orders: -     Hemoglobin A1c; Future  Cellulitis, neck Assessment & Plan: Could be skin irritation from scratching. I did not appreciate other areas with signs of infection. Instructed to avoid scratching and to keep finger nails short and clean. Doxycycline 100 mg bid x 10 days. Instructed about warning signs.  Orders: -     Doxycycline Hyclate; Take 1 tablet (100 mg total) by mouth 2 (two) times daily for 10 days.  Dispense: 20 tablet; Refill: 0  Morbid obesity with BMI of 50.0-59.9, adult [facility]) Assessment & Plan: She understands the benefits of wt loss as well as adverse effects of obesity. Consistency with healthy diet and physical activity encouraged. She has an appt with wt loss clinic next week.   Eczema, unspecified type Assessment &  Plan: Severe, problem is not well controlled. She has not been following with dermatologist regularly and would like to see a different provider, cancelled appt she had in 09/2022. Stressed the importance of establishing with dermatologist to discussed other treatment options. She will let us knew when she knows the mane of dermatologist she would like to see.   I spent a total of 43 minutes in both face to face and non face to face activities for this visit on the date of this encounter. During this time history was obtained and documented, examination was performed, prior labs reviewed, and assessment/plan discussed.  Return if symptoms worsen or fail to improve, for keep next appointment.  Vasilis Luhman G. [place], MD  [facility]. [place] office.

## 2022-03-21 ENCOUNTER — Ambulatory Visit: Payer: BC Managed Care – PPO | Admitting: Family Medicine

## 2022-03-21 ENCOUNTER — Encounter: Payer: Self-pay | Admitting: Family Medicine

## 2022-03-21 VITALS — BP 126/84 | HR 100 | Temp 98.2°F | Resp 12 | Ht 61.0 in | Wt 264.0 lb

## 2022-03-21 DIAGNOSIS — G4733 Obstructive sleep apnea (adult) (pediatric): Secondary | ICD-10-CM

## 2022-03-21 DIAGNOSIS — L03221 Cellulitis of neck: Secondary | ICD-10-CM | POA: Insufficient documentation

## 2022-03-21 DIAGNOSIS — E039 Hypothyroidism, unspecified: Secondary | ICD-10-CM

## 2022-03-21 DIAGNOSIS — Z9884 Bariatric surgery status: Secondary | ICD-10-CM

## 2022-03-21 DIAGNOSIS — E232 Diabetes insipidus: Secondary | ICD-10-CM

## 2022-03-21 DIAGNOSIS — D500 Iron deficiency anemia secondary to blood loss (chronic): Secondary | ICD-10-CM

## 2022-03-21 DIAGNOSIS — Z6841 Body Mass Index (BMI) 40.0 and over, adult: Secondary | ICD-10-CM

## 2022-03-21 DIAGNOSIS — R7303 Prediabetes: Secondary | ICD-10-CM

## 2022-03-21 DIAGNOSIS — R5383 Other fatigue: Secondary | ICD-10-CM | POA: Diagnosis not present

## 2022-03-21 DIAGNOSIS — L309 Dermatitis, unspecified: Secondary | ICD-10-CM

## 2022-03-21 LAB — COMPREHENSIVE METABOLIC PANEL
ALT: 28 U/L (ref 0–35)
AST: 33 U/L (ref 0–37)
Albumin: 4.1 g/dL (ref 3.5–5.2)
Alkaline Phosphatase: 110 U/L (ref 39–117)
BUN: 9 mg/dL (ref 6–23)
CO2: 23 mEq/L (ref 19–32)
Calcium: 9.5 mg/dL (ref 8.4–10.5)
Chloride: 109 mEq/L (ref 96–112)
Creatinine, Ser: 1.18 mg/dL (ref 0.40–1.20)
GFR: 60.69 mL/min (ref >60.00)
Glucose, Bld: 73 mg/dL (ref 70–99)
Potassium: 3.8 mEq/L (ref 3.5–5.1)
Sodium: 142 mEq/L (ref 135–145)
Total Bilirubin: 1 mg/dL (ref 0.2–1.2)
Total Protein: 7.7 g/dL (ref 6.0–8.3)

## 2022-03-21 LAB — HEMOGLOBIN A1C: Hgb A1c MFr Bld: 6 % (ref 4.6–6.5)

## 2022-03-21 LAB — CBC
HCT: 42.5 % (ref 36.0–46.0)
Hemoglobin: 13.6 g/dL (ref 12.0–15.0)
MCHC: 32 g/dL (ref 30.0–36.0)
MCV: 89.6 fl (ref 78.0–100.0)
Platelets: 380 10*3/uL (ref 150.0–400.0)
RBC: 4.75 Mil/uL (ref 3.87–5.11)
RDW: 15.2 % (ref 11.5–15.5)
WBC: 8.3 10*3/uL (ref 4.0–10.5)

## 2022-03-21 LAB — VITAMIN D 25 HYDROXY (VIT D DEFICIENCY, FRACTURES): VITD: 36.14 ng/mL (ref 30.00–100.00)

## 2022-03-21 LAB — VITAMIN B12: Vitamin B-12: 676 pg/mL (ref 211–911)

## 2022-03-21 LAB — FERRITIN: Ferritin: 12.1 ng/mL (ref 10.0–291.0)

## 2022-03-21 LAB — TSH: TSH: 2.13 u[IU]/mL (ref 0.35–5.50)

## 2022-03-21 LAB — T4, FREE: Free T4: 0.91 ng/dL (ref 0.60–1.60)

## 2022-03-21 LAB — IRON: Iron: 132 ug/dL (ref 42–145)

## 2022-03-21 MED ORDER — DOXYCYCLINE HYCLATE 100 MG PO TABS: 100.0000 mg | ORAL_TABLET | Freq: Two times a day (BID) | ORAL | 0 refills | Status: AC

## 2022-03-21 NOTE — Assessment & Plan Note (Signed)
Could be skin irritation from scratching. I did not appreciate other areas with signs of infection. Instructed to avoid scratching and to keep finger nails short and clean. Doxycycline 100 mg bid x 10 days. Instructed about warning signs.

## 2022-03-21 NOTE — Assessment & Plan Note (Signed)
She follows with nephrologist, has an appt already scheduled.

## 2022-03-21 NOTE — Assessment & Plan Note (Signed)
HgA1C was 6.1 in 09/2021. Consistency with a healthy life style encouraged for diabetes prevention.

## 2022-03-21 NOTE — Assessment & Plan Note (Addendum)
She understands the benefits of wt loss as well as adverse effects of obesity. Consistency with healthy diet and physical activity encouraged. She has an appt with wt loss clinic next week.

## 2022-03-21 NOTE — Patient Instructions (Addendum)
A few things to remember from today's visit:  OSA (obstructive sleep apnea)  Hypothyroidism, unspecified type - Plan: TSH, T4, free  Diabetes insipidus ([place])  Iron deficiency anemia due to chronic blood loss - Plan: CBC, Iron, Ferritin  S/P laparoscopic sleeve gastrectomy - Plan: Vitamin B12, VITAMIN D 25 Hydroxy (Vit-D Deficiency, Fractures)  Fatigue, unspecified type - Plan: Comprehensive metabolic panel, CBC  Prediabetes - Plan: Hemoglobin A1c  Cellulitis, neck - Plan: doxycycline (VIBRA-TABS) 100 MG tablet Please let us knew dermatologist you would like to see. Arrange appt with sleep apnea provider. Keep appt with nephrologist and wt loss clinic. Keep appt with endocrinologist.  Skin changes around your neck could be more irritation instead infection. Course of antibiotic sent to your pharmacy. Eczema Eczema refers to a group of skin conditions that cause skin to become rough and inflamed. Each type of eczema has different triggers, symptoms, and treatments. Eczema of any type is usually itchy. Symptoms range from mild to severe. Eczema is not spread from person to person (is not contagious). It can appear on different parts of the body at different times. One person's eczema may look different from another person's eczema. What are the causes? The exact cause of this condition is not known. However, exposure to certain environmental factors, irritants, and allergens can make the condition worse. What are the signs or symptoms? Symptoms of this condition depend on the type of eczema you have. The types include: Contact dermatitis. There are two kinds: Irritant contact dermatitis. This happens when something irritates the skin and causes a rash. Allergic contact dermatitis. This happens when your skin comes in contact with something you are allergic to (allergens). This can include poison ivy, chemicals, or medicines that were applied to your skin. Atopic dermatitis. This is a  long-term (chronic) skin disease that keeps coming back (recurring). It is the most common type of eczema. Usual symptoms are a red rash and itchy, dry, scaly skin. It usually starts showing signs in infancy and can last through adulthood. Dyshidrotic eczema. This is a form of eczema on the hands and feet. It shows up as very itchy, fluid-filled blisters. It can affect people of any age but is more common before age 6. Hand eczema. This causes very itchy areas of skin on the palms and sides of the hands and fingers. This type of eczema is common in industrial jobs where you may be exposed to different types of irritants. Lichen simplex chronicus. This type of eczema occurs when a person constantly scratches one area of the body. Repeated scratching of the area leads to thickened skin (lichenification). This condition can accompany other types of eczema. It is more common in adults but may also be seen in children. Nummular eczema. This is a common type of eczema that most often affects the lower legs and the backs of the hands. It typically causes an itchy, red, circular, crusty lesion (plaque). Scratching may become a habit and can cause bleeding. Nummular eczema occurs most often in middle-aged or older people. Seborrheic dermatitis. This is a common skin disease that mainly affects the scalp. It may also affect other oily areas of the body, such as the face, sides of the nose, eyebrows, ears, eyelids, and chest. It is marked by small scaling and redness of the skin (erythema). This can affect people of all ages. In infants, this condition is called cradle cap. Stasis dermatitis. This is a common skin disease that can cause itching, scaling, and hyperpigmentation, usually  on the legs and feet. It occurs most often in people who have a condition that prevents blood from being pumped through the veins in the legs (chronic venous insufficiency). Stasis dermatitis is a chronic condition that needs long-term  management. How is this diagnosed? This condition may be diagnosed based on: A physical exam of your skin. Your medical history. Skin patch tests. These tests involve using patches that contain possible allergens and placing them on your back. Your health care provider will check in a few days to see if an allergic reaction occurred. How is this treated? Treatment for eczema is based on the type of eczema you have. You may be given hydrocortisone steroid medicine or antihistamines. These can relieve itching quickly and help reduce inflammation. These may be prescribed or purchased over the counter, depending on the strength that is needed. Follow these instructions at home: Take or apply over-the-counter and prescription medicines only as told by your health care provider. Use creams or ointments to moisturize your skin. Do not use lotions. Learn what triggers or irritates your symptoms so you can avoid these things. Treat symptom flare-ups quickly. Do not scratch your skin. This can make your rash worse. Keep all follow-up visits. This is important. Where to find more information American Academy of Dermatology: MemberVerification.ca National Eczema Association: nationaleczema.org The Society for Pediatric Dermatology: pedsderm.net Contact a health care provider if: You have severe itching, even with treatment. You scratch your skin regularly until it bleeds. Your rash looks different than usual. Your skin is painful, swollen, or more red than usual. You have a fever. Summary Eczema refers to a group of skin conditions that cause skin to become rough and inflamed. Each type has different triggers. Eczema of any type causes itching that may range from mild to severe. Treatment varies based on the type of eczema you have. Hydrocortisone steroid medicine or antihistamines can help with itching and inflammation. Protecting your skin is the best way to prevent eczema. Use creams or ointments to moisturize  your skin. Avoid triggers and irritants. Treat flare-ups quickly. This information is not intended to replace advice given to you by your health care provider. Make sure you discuss any questions you have with your health care provider. Document Revised: 12/02/2019 Document Reviewed: 12/02/2019 Elsevier Patient Education  [place] not use My Chart to request refills or for acute issues that need immediate attention. If you send a my chart message, it may take a few days to be addressed, specially if I am not in the office.  Please be sure medication list is accurate. If a new problem present, please set up appointment sooner than planned today.

## 2022-03-21 NOTE — Assessment & Plan Note (Signed)
S/P myomectomy in 09/2021, which has helped with menstrual flow. CBC and iron studies ordered today. Continue OTC iron supplementation.

## 2022-03-21 NOTE — Assessment & Plan Note (Signed)
Wearing CPAP. This problem could be contributing to her fatigue. Instructed to arrange a f/u appt with sleep apnea provider.

## 2022-03-21 NOTE — Assessment & Plan Note (Addendum)
Severe, problem is not well controlled. She has not been following with dermatologist regularly and would like to see a different provider, cancelled appt she had in 09/2022. Stressed the importance of establishing with dermatologist to discussed other treatment options. She will let us knew when she knows the mane of dermatologist she would like to see.

## 2022-03-21 NOTE — Assessment & Plan Note (Signed)
B12 and 25 OH vit D ordered today. Continue OTC vit supplements.

## 2022-03-21 NOTE — Assessment & Plan Note (Addendum)
She is not longer on thyroid hormone replacement. Further recommendations according to TSH and free T4 results.

## 2022-03-25 NOTE — Assessment & Plan Note (Signed)
We discussed possible etiologies: Systemic illness, immunologic,endocrinology,sleep disorder, psychiatric/psychologic, infectious,medications side effects, and idiopathic. Examination today does not suggest a serious process but a few of her chronic medical conditions can be contributing factors. Further recommendations will be given according to lab results.

## 2022-04-01 ENCOUNTER — Encounter: Payer: Self-pay | Admitting: Family Medicine

## 2022-04-06 ENCOUNTER — Encounter: Payer: Self-pay | Admitting: Family Medicine

## 2022-04-08 NOTE — Progress Notes (Unsigned)
ACUTE VISIT No chief complaint on file.  HPI: Tiffany Roth is a 32 y.o. female, who is here today complaining of *** HPI  Review of Systems See other pertinent positives and negatives in HPI.  Current Outpatient Medications on File Prior to Visit  Medication Sig Dispense Refill   acetaminophen (TYLENOL) 325 MG tablet Take 2 tablets (650 mg total) by mouth every 6 (six) hours as needed for mild pain. 30 tablet 1   cetirizine (ZYRTEC ALLERGY) 10 MG tablet Take 1 tablet (10 mg total) by mouth daily. 90 tablet 3   clotrimazole-betamethasone (LOTRISONE) cream Apply 1 application. topically daily. 45 g 0   desmopressin (DDAVP) 0.1 MG tablet Take 0.1 mg by mouth daily.     ferrous sulfate 325 (65 FE) MG EC tablet Take 1 tablet (325 mg total) by mouth 2 (two) times daily with a meal. 90 tablet 11   fluticasone (FLONASE) 50 MCG/ACT nasal spray Place 1 spray into both nostrils daily as needed. 16 g 2   hydrocortisone 2.5 % ointment Apply topically twice daily as need to red sandpapery rash. 30 g 0   hydrOXYzine (VISTARIL) 25 MG capsule Take 1 capsule (25 mg total) by mouth every 8 (eight) hours as needed for itching. At night 30 capsule 0   ketoconazole (NIZORAL) 2 % shampoo Apply 1 application. topically 2 (two) times a week. 120 mL 0   Multiple Vitamin (MULTIVITAMIN ADULT PO) Take 1 tablet by mouth daily.     pimecrolimus (ELIDEL) 1 % cream Apply topically 2 (two) times daily. 30 g 0   polyethylene glycol (MIRALAX / GLYCOLAX) 17 g packet Take 17 g by mouth 2 (two) times daily. Until bm is regular 14 each 0   Skin Protectants, Misc. (EUCERIN) cream Apply 1 Application topically daily at 12 noon.     triamcinolone cream (KENALOG) 0.1 % Apply 1 application topically 2 (two) times daily as needed. Do not apply to face, groin or armpits (Patient taking differently: Apply 1 application  topically 2 (two) times daily as needed (Eczema). Do not apply to face, groin or armpits) 453 g 3   No  current facility-administered medications on file prior to visit.    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, was on thyroid replacement as a child   Prematurity    "3 months" 1# 15 oz" ventilator    Sinus infection    recently diagnosed currently on antibiotic will have completed dosage prior to surgery    Sleep apnea    uses CPAP    Varicose veins    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   Allergies  Allergen Reactions   Dairycare [Lactase-Lactobacillus] Nausea And Vomiting    Dairy products   Ibuprofen Other (See Comments)    DR told her not to take   Other Nausea And Vomiting    DAIRY products    Social History   Socioeconomic History   Marital status: Single    Spouse name: Not on file   Number of children: Not on file   Years of education: Not on file  Highest education level: Not on file  Occupational History   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    Doing much better less stress   Going to grad school in august .  [place] in [employer] education .    Social Determinants of Health   Financial Resource Strain: Not on file  Food Insecurity: Not on file  Transportation Needs: Not on file  Physical Activity: Not on file  Stress:  Not on file  Social Connections: Not on file    There were no vitals filed for this visit. There is no height or weight on file to calculate BMI.  Physical Exam  ASSESSMENT AND PLAN: There are no diagnoses linked to this encounter.  No follow-ups on file.  Alahni Varone G. [place], MD  [facility]. [place] office.  Discharge Instructions   None

## 2022-04-11 ENCOUNTER — Encounter: Payer: Self-pay | Admitting: Family Medicine

## 2022-04-11 ENCOUNTER — Ambulatory Visit (INDEPENDENT_AMBULATORY_CARE_PROVIDER_SITE_OTHER): Payer: BC Managed Care – PPO | Admitting: Family Medicine

## 2022-04-11 ENCOUNTER — Other Ambulatory Visit (HOSPITAL_COMMUNITY)
Admission: RE | Admit: 2022-04-11 | Discharge: 2022-04-11 | Disposition: A | Discharge status: Home / Self Care | Payer: BC Managed Care – PPO | Source: Ambulatory Visit | Attending: Family Medicine | Admitting: Family Medicine

## 2022-04-11 VITALS — BP 126/80 | HR 100 | Temp 98.3°F | Resp 12 | Ht 61.0 in | Wt 262.1 lb

## 2022-04-11 DIAGNOSIS — L309 Dermatitis, unspecified: Secondary | ICD-10-CM | POA: Diagnosis not present

## 2022-04-11 DIAGNOSIS — L292 Pruritus vulvae: Secondary | ICD-10-CM | POA: Insufficient documentation

## 2022-04-11 DIAGNOSIS — N898 Other specified noninflammatory disorders of vagina: Secondary | ICD-10-CM | POA: Diagnosis present

## 2022-04-11 DIAGNOSIS — R300 Dysuria: Secondary | ICD-10-CM | POA: Diagnosis not present

## 2022-04-11 DIAGNOSIS — L293 Anogenital pruritus, unspecified: Secondary | ICD-10-CM | POA: Diagnosis not present

## 2022-04-11 DIAGNOSIS — E232 Diabetes insipidus: Secondary | ICD-10-CM

## 2022-04-11 MED ORDER — DOXEPIN HCL 10 MG PO CAPS: 10.0000 mg | ORAL_CAPSULE | Freq: Every evening | ORAL | 1 refills | Status: DC | PRN

## 2022-04-11 MED ORDER — FLUCONAZOLE 150 MG PO TABS: 150.0000 mg | ORAL_TABLET | Freq: Once | ORAL | 0 refills | Status: AC

## 2022-04-11 MED ORDER — NYSTATIN-TRIAMCINOLONE 100000-0.1 UNIT/GM-% EX CREA: 1.0000 | TOPICAL_CREAM | Freq: Two times a day (BID) | CUTANEOUS | 1 refills | Status: DC | PRN

## 2022-04-11 NOTE — Patient Instructions (Signed)
A few things to remember from today's visit:  Dysuria - Plan: Urinalysis with Culture Reflex  Eczema, unspecified type - Plan: Ambulatory referral to Dermatology, doxepin (SINEQUAN) 10 MG capsule  Diabetes insipidus ([place])  Perineal pruritus in female - Plan: Cervicovaginal ancillary only, nystatin-triamcinolone (MYCOLOG II) cream  Vaginal discharge - Plan: Cervicovaginal ancillary only, fluconazole (DIFLUCAN) 150 MG tablet  Please call you nephrologist and let them know you are decreasing desmopressin dose due to eczema exacerbation. Keep appt with dermatologist. Will follow urine and culture, symptoms suggest yeast infection. Diflucan one tab and nystatin-triamcinolone cream 2 times daily, small amount for 14 days and then as needed.  If you need refills for medications you take chronically, please call your pharmacy. Do not use My Chart to request refills or for acute issues that need immediate attention. If you send a my chart message, it may take a few days to be addressed, specially if I am not in the office.  Please be sure medication list is accurate. If a new problem present, please set up appointment sooner than planned today.

## 2022-04-12 LAB — URINALYSIS W MICROSCOPIC + REFLEX CULTURE
Bacteria, UA: NONE SEEN /HPF
Bilirubin Urine: NEGATIVE
Glucose, UA: NEGATIVE
Hyaline Cast: NONE SEEN /LPF
Ketones, ur: NEGATIVE
Leukocyte Esterase: NEGATIVE
Nitrites, Initial: NEGATIVE
Protein, ur: NEGATIVE
RBC / HPF: NONE SEEN /HPF (ref 0–2)
Specific Gravity, Urine: 1.004 (ref 1.001–1.035)
Squamous Epithelial / HPF: NONE SEEN /HPF (ref <=5)
WBC, UA: NONE SEEN /HPF (ref 0–5)
pH: 5.5 (ref 5.0–8.0)

## 2022-04-12 LAB — CERVICOVAGINAL ANCILLARY ONLY
Bacterial Vaginitis (gardnerella): NEGATIVE
Candida Glabrata: NEGATIVE
Candida Vaginitis: NEGATIVE
Comment: NEGATIVE
Comment: NEGATIVE
Comment: NEGATIVE

## 2022-04-12 LAB — NO CULTURE INDICATED

## 2022-04-21 ENCOUNTER — Encounter: Payer: Self-pay | Admitting: Family Medicine

## 2022-04-21 ENCOUNTER — Ambulatory Visit: Payer: BC Managed Care – PPO | Admitting: Family Medicine

## 2022-04-21 VITALS — BP 122/78 | HR 120 | Temp 98.7°F | Ht 61.0 in

## 2022-04-21 DIAGNOSIS — L309 Dermatitis, unspecified: Secondary | ICD-10-CM | POA: Diagnosis not present

## 2022-04-21 DIAGNOSIS — L00 Staphylococcal scalded skin syndrome: Secondary | ICD-10-CM | POA: Diagnosis not present

## 2022-04-21 MED ORDER — HYDROXYZINE PAMOATE 25 MG PO CAPS: 25.0000 mg | ORAL_CAPSULE | Freq: Every day | ORAL | 1 refills | Status: DC

## 2022-04-21 MED ORDER — CETIRIZINE HCL 10 MG PO TABS: 10.0000 mg | ORAL_TABLET | Freq: Every day | ORAL | 3 refills | Status: DC

## 2022-04-21 MED ORDER — SULFAMETHOXAZOLE-TRIMETHOPRIM 800-160 MG PO TABS: 1.0000 | ORAL_TABLET | Freq: Two times a day (BID) | ORAL | 0 refills | Status: AC

## 2022-04-21 MED ORDER — CLOBETASOL PROPIONATE 0.05 % EX OINT: 1.0000 | TOPICAL_OINTMENT | Freq: Two times a day (BID) | CUTANEOUS | 0 refills | Status: DC

## 2022-04-21 MED ORDER — PREDNISONE 20 MG PO TABS: ORAL_TABLET | ORAL | 0 refills | Status: AC

## 2022-04-21 NOTE — Progress Notes (Signed)
Acute Office Visit  Subjective:     Patient ID: Tiffany Roth, female    DOB: 1991/02/02, 32 y.o.   MRN: [id number]  Chief Complaint  Patient presents with   Rash    Patient complains of severe eczema flare of entire body x1 month, states she was lastly seen by a dermatologist in [facility], did not care for her bedside manner and now awaiting referral from PCP to go back to [facility] (told next appt is in 8 months)    Patient is here for an acute visit for her eczema flare up. States that she has been having this particular flare up has been going on for about a month. States this happens to her often, but not usually this severe. States that she is so itchy/ miserable she had to leave work early today. States that she has to wait 8 months to get in to see [facility]. I reviewed Dr. Doug Sou note from last month, she was given doxycycline at the time for a secondary infection. Patient states she has some portions of the rash that are crusting over and draining fluid. The rash is worse on her face and neck but it is diffuse, all over her body. She denies any fever/chills at this time. States she has had eczema all of her life since she was a kid.     Review of Systems  All other systems reviewed and are negative.       Objective:    BP 122/78 (BP Location: Left Arm, Patient Position: Sitting, Cuff Size: Large)   Pulse (!) 120   Temp 98.7 F (37.1 C) (Oral)   Ht 5' 1"$  (1.549 m)   LMP 04/01/2022   SpO2 98%   BMI 49.53 kg/m    Physical Exam Vitals reviewed.  Constitutional:      Appearance: Normal appearance. She is obese.  Skin:    General: Skin is dry.     Findings: Rash (severe diffuse flaking rash all over her body, worse on her face, neck and scalp, there are superficially open "cracks" i nthe skin that are crusting over.) present.  Neurological:     Mental Status: She is alert.     No results found for any visits on 04/21/22.      Assessment  & Plan:   Problem List Items Addressed This Visit       Unprioritized   Eczema - Primary    Severe flare up, likely with secondary staph infection. She was just treated last month with doxycycline and she did report improvement with the abx however the improvement was short-lived. We had a long conversation about using oily emollients. I gave her written and verbal instructions on her skin regimen. She may use cetirizine one daily, the  prednisone taper as listed below, then hydroxyzine at night to help her sleep and when she completes hte prednisone taper she can use the clobetasol oointment. I advised that she get on a cancellation list for the dermatologist.       Relevant Medications   predniSONE (DELTASONE) 20 MG tablet   clobetasol ointment (TEMOVATE) 0.05 %   cetirizine (ZYRTEC ALLERGY) 10 MG tablet   hydrOXYzine (VISTARIL) 25 MG capsule   Other Visit Diagnoses     Scalded skin syndrome       Relevant Medications   sulfamethoxazole-trimethoprim (BACTRIM DS) 800-160 MG tablet       Meds ordered this encounter  Medications   predniSONE (DELTASONE) 20 MG  tablet    Sig: Take 3 tablets (60 mg total) by mouth daily with breakfast for 3 days, THEN 2 tablets (40 mg total) daily with breakfast for 3 days, THEN 1 tablet (20 mg total) daily with breakfast for 3 days, THEN 0.5 tablets (10 mg total) daily with breakfast for 3 days.    Dispense:  19.5 tablet    Refill:  0   clobetasol ointment (TEMOVATE) 0.05 %    Sig: Apply 1 Application topically 2 (two) times daily.    Dispense:  30 g    Refill:  0   cetirizine (ZYRTEC ALLERGY) 10 MG tablet    Sig: Take 1 tablet (10 mg total) by mouth daily.    Dispense:  90 tablet    Refill:  3   hydrOXYzine (VISTARIL) 25 MG capsule    Sig: Take 1-2 capsules (25-50 mg total) by mouth at bedtime. At night    Dispense:  90 capsule    Refill:  1   sulfamethoxazole-trimethoprim (BACTRIM DS) 800-160 MG tablet    Sig: Take 1 tablet by mouth 2 (two)  times daily for 7 days.    Dispense:  14 tablet    Refill:  0    No follow-ups on file.  Farrel Conners, MD

## 2022-04-21 NOTE — Assessment & Plan Note (Signed)
Severe flare up, likely with secondary staph infection. She was just treated last month with doxycycline and she did report improvement with the abx however the improvement was short-lived. We had a long conversation about using oily emollients. I gave her written and verbal instructions on her skin regimen. She may use cetirizine one daily, the  prednisone taper as listed below, then hydroxyzine at night to help her sleep and when she completes hte prednisone taper she can use the clobetasol oointment. I advised that she get on a cancellation list for the dermatologist.

## 2022-04-21 NOTE — Patient Instructions (Addendum)
Call the dermatologist-- ask to be placed on their cancellation list.   OK to use oatmeal baths, then use the steroids cream, then apply the thick oily lotion once a day. Use the moisturizer at least 3 times day

## 2022-05-28 ENCOUNTER — Other Ambulatory Visit: Payer: Self-pay | Admitting: Family Medicine

## 2022-05-28 DIAGNOSIS — L309 Dermatitis, unspecified: Secondary | ICD-10-CM

## 2022-06-10 ENCOUNTER — Ambulatory Visit: Payer: BC Managed Care – PPO | Admitting: Family Medicine

## 2022-06-14 ENCOUNTER — Ambulatory Visit: Payer: BC Managed Care – PPO | Admitting: Dermatology

## 2022-06-14 DIAGNOSIS — L209 Atopic dermatitis, unspecified: Secondary | ICD-10-CM

## 2022-06-14 DIAGNOSIS — L309 Dermatitis, unspecified: Secondary | ICD-10-CM

## 2022-06-14 MED ORDER — CLOBETASOL PROPIONATE 0.05 % EX OINT: 1.0000 | TOPICAL_OINTMENT | Freq: Two times a day (BID) | CUTANEOUS | 2 refills | Status: DC

## 2022-06-14 MED ORDER — PREDNISONE 10 MG PO TABS: ORAL_TABLET | ORAL | 0 refills | Status: DC

## 2022-06-14 NOTE — Progress Notes (Unsigned)
   New Patient Visit   Subjective  Tiffany Roth is a 32 y.o. female who presents for the following: Atopic Dermatitis  Patient is here for eczema. There have been more severe flares as an adult. Triamcinolone has been given (cream and ointment), dupixent, Eucerine, and Clobetasol. What worked the best was doxycycline and prednisone. Last round was about a month ago. The dupixent broke patient out all over.  At this time, Triamcinolone, Aquaphor, and Eucerin is the treatment. Dove sensitive skin to wash.   The following portions of the chart were reviewed this encounter and updated as appropriate: medications, allergies, medical history  Review of Systems:  No other skin or systemic complaints except as noted in HPI or Assessment and Plan.  Objective  Well appearing patient in no apparent distress; mood and affect are within normal limits.  Areas Examined: Full body  Relevant physical exam findings are noted in the Assessment and Plan.    Assessment & Plan   Eczema, unspecified type  Related Medications doxepin (SINEQUAN) 10 MG capsule Take 1 capsule (10 mg total) by mouth at bedtime as needed.  clobetasol ointment (TEMOVATE) 0.05 % Apply 1 Application topically 2 (two) times daily.  cetirizine (ZYRTEC ALLERGY) 10 MG tablet Take 1 tablet (10 mg total) by mouth daily.  hydrOXYzine (VISTARIL) 25 MG capsule Take 1-2 capsules (25-50 mg total) by mouth at bedtime as needed.     ATOPIC DERMATITIS Exam: Scaly pink papules coalescing to plaques  Flared  Atopic dermatitis (eczema) is a chronic, relapsing, pruritic condition that can significantly affect quality of life. It is often associated with allergic rhinitis and/or asthma and can require treatment with topical medications, phototherapy, or in severe cases biologic injectable medication (Dupixent; Adbry) or Oral JAK inhibitors.  Treatment Plan: -Getting baseline labs drawn before starting a JAK inhibitor -Continue  moisturizing -start taking Aveeno oatmeal baths, towel dry, apply Eucerin moisturizer, follow up with Aquaphor spray -Oral Prednisone taper pack  -Clobetasol ointment to use twice a day for up to two weeks   Recommend gentle skin care.   Return in about 3 weeks (around 07/05/2022) for eczema follow up.    Documentation: I have reviewed the above documentation for accuracy and completeness, and I agree with the above.  Langston Reusing, MD I, Germaine Pomfret, CMA, am acting as scribe for Langston Reusing, MD.

## 2022-06-14 NOTE — Patient Instructions (Addendum)
Due to recent changes in healthcare laws, you may see results of your pathology and/or laboratory studies on MyChart before the doctors have had a chance to review them. We understand that in some cases there may be results that are confusing or concerning to you. Please understand that not all results are received at the same time and often the doctors may need to interpret multiple results in order to provide you with the best plan of care or course of treatment. Therefore, we ask that you please give us 2 business days to thoroughly review all your results before contacting the office for clarification. Should we see a critical lab result, you will be contacted sooner.   If You Need Anything After Your Visit  If you have any questions or concerns for your doctor, please call our main line at [phone] If no one answers, please leave a voicemail as directed and we will return your call as soon as possible. Messages left after 4 pm will be answered the following business day.   You may also send us a message via MyChart. We typically respond to MyChart messages within 1-2 business days.  For prescription refills, please ask your pharmacy to contact our office. Our fax number is [phone].  If you have an urgent issue when the clinic is closed that cannot wait until the next business day, you can page your doctor at the number below.    Please note that while we do our best to be available for urgent issues outside of office hours, we are not available 24/7.   If you have an urgent issue and are unable to reach us, you may choose to seek medical care at your doctor's office, retail clinic, urgent care center, or emergency room.  If you have a medical emergency, please immediately call 911 or go to the emergency department. In the event of inclement weather, please call our main line at [phone] for an update on the status of any delays or closures.  Dermatology Medication Tips: Please  keep the boxes that topical medications come in in order to help keep track of the instructions about where and how to use these. Pharmacies typically print the medication instructions only on the boxes and not directly on the medication tubes.   If your medication is too expensive, please contact our office at [phone] or send us a message through MyChart.   We are unable to tell what your co-pay for medications will be in advance as this is different depending on your insurance coverage. However, we may be able to find a substitute medication at lower cost or fill out paperwork to get insurance to cover a needed medication.   If a prior authorization is required to get your medication covered by your insurance company, please allow us 1-2 business days to complete this process.  Drug prices often vary depending on where the prescription is filled and some pharmacies may offer cheaper prices.  The website www.goodrx.com contains coupons for medications through different pharmacies. The prices here do not account for what the cost may be with help from insurance (it may be cheaper with your insurance), but the website can give you the price if you did not use any insurance.  - You can print the associated coupon and take it with your prescription to the pharmacy.  - You may also stop by our office during regular business hours and pick up a GoodRx coupon card.  - If you need your   prescription sent electronically to a different pharmacy, notify our office through [place] MyChart or by phone at [phone]     

## 2022-06-15 ENCOUNTER — Encounter: Payer: Self-pay | Admitting: Dermatology

## 2022-06-23 ENCOUNTER — Telehealth: Payer: Self-pay | Admitting: Dermatology

## 2022-07-02 LAB — CBC WITH DIFFERENTIAL/PLATELET
Basophils Absolute: 0.1 10*3/uL (ref 0.0–0.2)
Basos: 1 %
EOS (ABSOLUTE): 0.8 10*3/uL — ABNORMAL HIGH (ref 0.0–0.4)
Eos: 9 %
Hematocrit: 40.4 % (ref 34.0–46.6)
Hemoglobin: 13.2 g/dL (ref 11.1–15.9)
Immature Grans (Abs): 0 10*3/uL (ref 0.0–0.1)
Immature Granulocytes: 0 %
Lymphocytes Absolute: 2.7 10*3/uL (ref 0.7–3.1)
Lymphs: 29 %
MCH: 28.9 pg (ref 26.6–33.0)
MCHC: 32.7 g/dL (ref 31.5–35.7)
MCV: 89 fL (ref 79–97)
Monocytes Absolute: 0.8 10*3/uL (ref 0.1–0.9)
Monocytes: 9 %
Neutrophils Absolute: 5 10*3/uL (ref 1.4–7.0)
Neutrophils: 52 %
Platelets: 422 10*3/uL (ref 150–450)
RBC: 4.56 x10E6/uL (ref 3.77–5.28)
RDW: 12.4 % (ref 11.7–15.4)
WBC: 9.3 10*3/uL (ref 3.4–10.8)

## 2022-07-02 LAB — COMPREHENSIVE METABOLIC PANEL
ALT: 22 IU/L (ref 0–32)
AST: 27 IU/L (ref 0–40)
Albumin/Globulin Ratio: 1.3 (ref 1.2–2.2)
Albumin: 4 g/dL (ref 3.9–4.9)
Alkaline Phosphatase: 131 IU/L — ABNORMAL HIGH (ref 44–121)
BUN/Creatinine Ratio: 8 — ABNORMAL LOW (ref 9–23)
BUN: 10 mg/dL (ref 6–20)
Bilirubin Total: 0.7 mg/dL (ref 0.0–1.2)
CO2: 22 mmol/L (ref 20–29)
Calcium: 10.1 mg/dL (ref 8.7–10.2)
Chloride: 110 mmol/L — ABNORMAL HIGH (ref 96–106)
Creatinine, Ser: 1.25 mg/dL — ABNORMAL HIGH (ref 0.57–1.00)
Globulin, Total: 3.2 g/dL (ref 1.5–4.5)
Glucose: 77 mg/dL (ref 70–99)
Potassium: 4.2 mmol/L (ref 3.5–5.2)
Sodium: 147 mmol/L — ABNORMAL HIGH (ref 134–144)
Total Protein: 7.2 g/dL (ref 6.0–8.5)
eGFR: 58 mL/min/{1.73_m2} — ABNORMAL LOW (ref >59)

## 2022-07-02 LAB — LIPID PANEL
Chol/HDL Ratio: 4.4 ratio (ref 0.0–4.4)
Cholesterol, Total: 175 mg/dL (ref 100–199)
HDL: 40 mg/dL (ref >39)
LDL Chol Calc (NIH): 104 mg/dL — ABNORMAL HIGH (ref 0–99)
Triglycerides: 175 mg/dL — ABNORMAL HIGH (ref 0–149)
VLDL Cholesterol Cal: 31 mg/dL (ref 5–40)

## 2022-07-04 NOTE — Progress Notes (Signed)
Hi Neva,  We can go ahead and send the Rx for Rinvoq 15mg  tablet.  Sig: take one tablet daily and we will recheck labs in 2 months.    When sending Rinvoq send an electronic rx to [facility] in addition to faxing the Rinvoq form, clinical notes and insurance information to [place].    Thanks!  -Dr. Onalee Hua

## 2022-07-11 ENCOUNTER — Encounter: Payer: Self-pay | Admitting: Dermatology

## 2022-07-11 ENCOUNTER — Ambulatory Visit: Payer: BC Managed Care – PPO | Admitting: Dermatology

## 2022-07-11 VITALS — BP 131/79 | HR 94

## 2022-07-11 DIAGNOSIS — L309 Dermatitis, unspecified: Secondary | ICD-10-CM

## 2022-07-11 DIAGNOSIS — L209 Atopic dermatitis, unspecified: Secondary | ICD-10-CM

## 2022-07-11 MED ORDER — PREDNISONE 10 MG PO TABS: ORAL_TABLET | ORAL | 0 refills | Status: AC

## 2022-07-11 NOTE — Patient Instructions (Addendum)
Start Prednisone 10 mg Take 4 every day for 1 week, then 3 every day for 1 week, then 2 every day for 1 week, then 1 every day for 1 week.  Risks of prednisone taper discussed including mood irritability, insomnia, weight gain, stomach ulcers, increased risk of infection, increased blood sugar (diabetes), hypertension, osteoporosis with long-term or frequent use, and rare risk of avascular necrosis of the hip.    Continue Clobetasol ointment twice a day up to 2 weeks to affected areas on body. Avoid applying to face, groin, and axilla. Use as directed. Long-term use can cause thinning of the skin.  Topical steroids (such as triamcinolone, fluocinolone, fluocinonide, mometasone, clobetasol, halobetasol, betamethasone, hydrocortisone) can cause thinning and lightening of the skin if they are used for too long in the same area. Your physician has selected the right strength medicine for your problem and area affected on the body. Please use your medication only as directed by your physician to prevent side effects.    Start Opzelura cream once daily to face.    Recommend using Avene thermal water on face.      Gentle Skin Care Guide  1. Bathe no more than once a day.  2. Avoid bathing in hot water  3. Use a mild soap like Dove, Vanicream, Cetaphil, CeraVe. Can use Lever 2000 or Cetaphil antibacterial soap  4. Use soap only where you need it. On most days, use it under your arms, between your legs, and on your feet. Let the water rinse other areas unless visibly dirty.  5. When you get out of the bath/shower, use a towel to gently blot your skin dry, don't rub it.  6. While your skin is still a little damp, apply a moisturizing cream such as Vanicream, CeraVe, Cetaphil, Eucerin, Sarna lotion or plain Vaseline Jelly. For hands apply Neutrogena [place] Hand Cream or Excipial Hand Cream.  7. Reapply moisturizer any time you start to itch or feel dry.  8. Sometimes using free and clear  laundry detergents can be helpful. Fabric softener sheets should be avoided. Downy Free & Gentle liquid, or any liquid fabric softener that is free of dyes and perfumes, it acceptable to use  9. If your doctor has given you prescription creams you may apply moisturizers over them     Due to recent changes in healthcare laws, you may see results of your pathology and/or laboratory studies on MyChart before the doctors have had a chance to review them. We understand that in some cases there may be results that are confusing or concerning to you. Please understand that not all results are received at the same time and often the doctors may need to interpret multiple results in order to provide you with the best plan of care or course of treatment. Therefore, we ask that you please give [place] 2 business days to thoroughly review all your results before contacting the office for clarification. Should we see a critical lab result, you will be contacted sooner.   If You Need Anything After Your Visit  If you have any questions or concerns for your doctor, please call our main line at [phone] If no one answers, please leave a voicemail as directed and we will return your call as soon as possible. Messages left after 4 pm will be answered the following business day.   You may also send [place] a message via MyChart. We typically respond to MyChart messages within 1-2 business days.  For prescription refills, please  ask your pharmacy to contact our office. Our fax number is [phone].  If you have an urgent issue when the clinic is closed that cannot wait until the next business day, you can page your doctor at the number below.    Please note that while we do our best to be available for urgent issues outside of office hours, we are not available 24/7.   If you have an urgent issue and are unable to reach [place], you may choose to seek medical care at your doctor's office, retail clinic, urgent care center,  or emergency room.  If you have a medical emergency, please immediately call 911 or go to the emergency department. In the event of inclement weather, please call our main line at [phone] for an update on the status of any delays or closures.  Dermatology Medication Tips: Please keep the boxes that topical medications come in in order to help keep track of the instructions about where and how to use these. Pharmacies typically print the medication instructions only on the boxes and not directly on the medication tubes.   If your medication is too expensive, please contact our office at [phone] or send [place] a message through MyChart.   We are unable to tell what your co-pay for medications will be in advance as this is different depending on your insurance coverage. However, we may be able to find a substitute medication at lower cost or fill out paperwork to get insurance to cover a needed medication.   If a prior authorization is required to get your medication covered by your insurance company, please allow [place] 1-2 business days to complete this process.  Drug prices often vary depending on where the prescription is filled and some pharmacies may offer cheaper prices.  The website www.goodrx.com contains coupons for medications through different pharmacies. The prices here do not account for what the cost may be with help from insurance (it may be cheaper with your insurance), but the website can give you the price if you did not use any insurance.  - You can print the associated coupon and take it with your prescription to the pharmacy.  - You may also stop by our office during regular business hours and pick up a GoodRx coupon card.  - If you need your prescription sent electronically to a different pharmacy, notify our office through [facility] or by phone at [phone]

## 2022-07-11 NOTE — Progress Notes (Signed)
   Follow Up Visit   Subjective  Tiffany Roth is a 32 y.o. female who presents for the following: 3 week Rash follow up. Discuss Rinvoq. Taking oral prednisone taper as directed.  Using Clobetasol ointment on body as directed and Desonide ointment on face sparingly. Has been using Nizoral, thought could be seborrheic dermatitis.  Pt was doing very well while on the prednisone taper.  She is pending approval for Rivnoq since she has failed Dupixent due to allergic reaction.  We are still waiting for   I the meantime, after being off the prednisone her itching has returned and is starting to interfere with her morning routine and getting to work on time.    The following portions of the chart were reviewed this encounter and updated as appropriate: medications, allergies, medical history  Review of Systems:  No other skin or systemic complaints except as noted in HPI or Assessment and Plan.  Objective  Well appearing patient in no apparent distress; mood and affect are within normal limits.  A focused examination was performed of the following areas: Scalp, face, arms  Relevant exam findings are noted in the Assessment and Plan.    Assessment & Plan     Atopic Dermatitis Exam: moderate erythema and scale, marked xerosis, IGA 3,   Chronic and persistent condition with duration or expected duration over one year. Condition is symptomatic/ bothersome to patient. Not currently at goal.   Treatment Plan: Prednisone 10 mg Take 4 every day for 1 week, then 3 every day for 1 week, then 2 every day for 1 week, then 1 every day for 1 week. (Of note she was taking the prior taper every other day for 1 month so we are restarting the taper)  Start Opzelura cream once daily to face. (Samples provided)  Continue Clobetasol ointment twice a day up to 2 weeks to affected areas on body. Avoid applying to face, groin, and axilla. Use as directed. Long-term use can cause thinning of the  skin.  Labs still pending before Rinvoq rx can be completed.       Topical steroids (such as triamcinolone, fluocinolone, fluocinonide, mometasone, clobetasol, halobetasol, betamethasone, hydrocortisone) can cause thinning and lightening of the skin if they are used for too long in the same area. Your physician has selected the right strength medicine for your problem and area affected on the body. Please use your medication only as directed by your physician to prevent side effects.      Return in about 3 weeks (around 08/01/2022) for Atopic Dermatitis.  I, Lawson Radar, CMA, am acting as scribe for Langston Reusing, MD.   Documentation: I have reviewed the above documentation for accuracy and completeness, and I agree with the above.  Langston Reusing, MD

## 2022-07-12 ENCOUNTER — Ambulatory Visit: Payer: BC Managed Care – PPO | Admitting: Dermatology

## 2022-07-12 NOTE — Progress Notes (Unsigned)
ACUTE VISIT No chief complaint on file.  HPI: [name] is a 31 y.o. female, who is here today complaining of *** HPI  Review of Systems See other pertinent positives and negatives in HPI.  Current Outpatient Medications on File Prior to Visit  Medication Sig Dispense Refill   acetaminophen (TYLENOL) 325 MG tablet Take 2 tablets (650 mg total) by mouth every 6 (six) hours as needed for mild pain. 30 tablet 1   cetirizine (ZYRTEC ALLERGY) 10 MG tablet Take 1 tablet (10 mg total) by mouth daily. 90 tablet 3   clobetasol ointment (TEMOVATE) 0.05 % Apply 1 Application topically 2 (two) times daily. Apply topically to the body twice a day for up to two weeks. Mix with Eucerin for easier spread 30 g 2   clotrimazole-betamethasone (LOTRISONE) cream Apply 1 application. topically daily. 45 g 0   desmopressin (DDAVP) 0.1 MG tablet Take 0.1 mg by mouth daily.     doxepin (SINEQUAN) 10 MG capsule Take 1 capsule (10 mg total) by mouth at bedtime as needed. 30 capsule 1   ferrous sulfate 325 (65 FE) MG EC tablet Take 1 tablet (325 mg total) by mouth 2 (two) times daily with a meal. 90 tablet 11   fluticasone (FLONASE) 50 MCG/ACT nasal spray Place 1 spray into both nostrils daily as needed. 16 g 2   hydrOXYzine (VISTARIL) 25 MG capsule Take 1-2 capsules (25-50 mg total) by mouth at bedtime as needed. 60 capsule 0   ketoconazole (NIZORAL) 2 % shampoo Apply 1 application. topically 2 (two) times a week. 120 mL 0   Multiple Vitamin (MULTIVITAMIN ADULT PO) Take 1 tablet by mouth daily.     nystatin-triamcinolone (MYCOLOG II) cream Apply 1 Application topically 2 (two) times daily as needed. 30 g 1   pimecrolimus (ELIDEL) 1 % cream Apply topically 2 (two) times daily. 30 g 0   polyethylene glycol (MIRALAX / GLYCOLAX) 17 g packet Take 17 g by mouth 2 (two) times daily. Until bm is regular 14 each 0   predniSONE (DELTASONE) 10 MG tablet Take 4 tablets (40 mg total) by mouth daily with breakfast  for 7 days, THEN 3 tablets (30 mg total) daily with breakfast for 7 days, THEN 2 tablets (20 mg total) daily with breakfast for 7 days, THEN 1 tablet (10 mg total) daily with breakfast for 7 days. 70 tablet 0   triamcinolone cream (KENALOG) 0.1 % Apply 1 application topically 2 (two) times daily as needed. Do not apply to face, groin or armpits (Patient taking differently: Apply 1 application  topically 2 (two) times daily as needed (Eczema). Do not apply to face, groin or armpits) 453 g 3   No current facility-administered medications on file prior to visit.    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   Allergies  Allergen Reactions   Dairycare [Lactase-Lactobacillus] Nausea And Vomiting    Dairy products   Dupixent [Dupilumab]     Worsening of rash   Ibuprofen Other (See Comments)    DR told her not to take   Other Nausea And Vomiting    DAIRY products, nickel    Social History   Socioeconomic History   Marital status: Single    Spouse name: Not on file   Number of children: Not on file   Years of education: Not on file   Highest education level: Not on file  Occupational History   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    Doing much better less stress   Going to grad school in august .  [place] in [employer] education .    Social Determinants of Health   Financial Resource Strain: Not on file  Food Insecurity: Not on file  Transportation Needs: Not on file  Physical Activity: Not on file  Stress: Not on file  Social Connections: Not on file    There were no vitals filed for this visit. There is no height or weight on file to calculate BMI.  Physical Exam  ASSESSMENT AND PLAN: There are no diagnoses linked to this encounter.  No follow-ups on file.  Willona Phariss G. [place], MD  [facility]. [place] office.  Discharge Instructions   None

## 2022-07-13 ENCOUNTER — Ambulatory Visit: Payer: BC Managed Care – PPO | Admitting: Family Medicine

## 2022-07-13 ENCOUNTER — Encounter: Payer: Self-pay | Admitting: Family Medicine

## 2022-07-13 VITALS — BP 120/78 | HR 100 | Temp 98.6°F | Resp 12 | Ht 61.0 in | Wt 260.1 lb

## 2022-07-13 DIAGNOSIS — J310 Chronic rhinitis: Secondary | ICD-10-CM

## 2022-07-13 DIAGNOSIS — J0100 Acute maxillary sinusitis, unspecified: Secondary | ICD-10-CM | POA: Diagnosis not present

## 2022-07-13 MED ORDER — AMOXICILLIN-POT CLAVULANATE 875-125 MG PO TABS
1.0000 | ORAL_TABLET | Freq: Two times a day (BID) | ORAL | 0 refills | Status: AC
Start: 2022-07-13 — End: 2022-07-23

## 2022-07-13 MED ORDER — FLUTICASONE PROPIONATE 50 MCG/ACT NA SUSP
1.0000 | Freq: Every day | NASAL | 2 refills | Status: DC | PRN
Start: 2022-07-13 — End: 2022-10-10

## 2022-07-13 NOTE — Patient Instructions (Signed)
A few things to remember from today's visit:  Acute non-recurrent maxillary sinusitis - Plan: amoxicillin-clavulanate (AUGMENTIN) 875-125 MG tablet  Rhinitis, unspecified type - Plan: fluticasone (FLONASE) 50 MCG/ACT nasal spray  Try Flonase nasal spray at night for 10 days and nasal saline irrigations as needed during the day. If not any better in 4 days you can go ahead and start antibiotic treatment.  If you need refills for medications you take chronically, please call your pharmacy. Do not use My Chart to request refills or for acute issues that need immediate attention. If you send a my chart message, it may take a few days to be addressed, specially if I am not in the office.  Please be sure medication list is accurate. If a new problem present, please set up appointment sooner than planned today.

## 2022-07-26 ENCOUNTER — Encounter: Payer: Self-pay | Admitting: Dermatology

## 2022-07-28 ENCOUNTER — Telehealth: Payer: Self-pay

## 2022-07-28 NOTE — Telephone Encounter (Signed)
Called pt and left vm regarding recent labs. Please transfer if she calls back

## 2022-08-12 ENCOUNTER — Ambulatory Visit (INDEPENDENT_AMBULATORY_CARE_PROVIDER_SITE_OTHER): Payer: BC Managed Care – PPO

## 2022-08-12 DIAGNOSIS — Z111 Encounter for screening for respiratory tuberculosis: Secondary | ICD-10-CM

## 2022-08-12 NOTE — Progress Notes (Signed)
PPD Placement note Tiffany Roth, 32 y.o. female is here today for placement of PPD test Reason for PPD test: pt states it is required by her dermatologist,Jennifer Onalee Hua, DO, to get approved for Rinvoq.  Pt taken PPD test before: yes Verified in allergy area and with patient that they are not allergic to the products PPD is made of (Phenol or Tween). No:  Is patient taking any oral or IV steroid medication now or have they taken it in the last month? Yes, took prednisone at the beginning of last month.  Has the patient ever received the BCG vaccine?: no Has the patient been in recent contact with anyone known or suspected of having active TB disease?: no      Date of exposure (if applicable):       Name of person they were exposed to (if applicable):  Patient's Country of origin?: [place] O: Alert and oriented in NAD. P:  PPD placed on 08/12/2022.  Patient advised to return for reading within 48-72 hours.  TB placed on L forearm.

## 2022-08-15 ENCOUNTER — Ambulatory Visit: Payer: BC Managed Care – PPO | Admitting: Dermatology

## 2022-08-15 ENCOUNTER — Ambulatory Visit: Payer: BC Managed Care – PPO

## 2022-08-15 VITALS — BP 116/74

## 2022-08-15 DIAGNOSIS — L2089 Other atopic dermatitis: Secondary | ICD-10-CM | POA: Diagnosis not present

## 2022-08-15 DIAGNOSIS — Z111 Encounter for screening for respiratory tuberculosis: Secondary | ICD-10-CM

## 2022-08-15 DIAGNOSIS — L409 Psoriasis, unspecified: Secondary | ICD-10-CM

## 2022-08-15 LAB — TB SKIN TEST
Induration: 0 mm
TB Skin Test: NEGATIVE

## 2022-08-15 NOTE — Patient Instructions (Signed)
Due to recent changes in healthcare laws, you may see results of your pathology and/or laboratory studies on MyChart before the doctors have had a chance to review them. We understand that in some cases there may be results that are confusing or concerning to you. Please understand that not all results are received at the same time and often the doctors may need to interpret multiple results in order to provide you with the best plan of care or course of treatment. Therefore, we ask that you please give us 2 business days to thoroughly review all your results before contacting the office for clarification. Should we see a critical lab result, you will be contacted sooner.   If You Need Anything After Your Visit  If you have any questions or concerns for your doctor, please call our main line at [phone] If no one answers, please leave a voicemail as directed and we will return your call as soon as possible. Messages left after 4 pm will be answered the following business day.   You may also send us a message via MyChart. We typically respond to MyChart messages within 1-2 business days.  For prescription refills, please ask your pharmacy to contact our office. Our fax number is [phone].  If you have an urgent issue when the clinic is closed that cannot wait until the next business day, you can page your doctor at the number below.    Please note that while we do our best to be available for urgent issues outside of office hours, we are not available 24/7.   If you have an urgent issue and are unable to reach us, you may choose to seek medical care at your doctor's office, retail clinic, urgent care center, or emergency room.  If you have a medical emergency, please immediately call 911 or go to the emergency department. In the event of inclement weather, please call our main line at [phone] for an update on the status of any delays or closures.  Dermatology Medication Tips: Please  keep the boxes that topical medications come in in order to help keep track of the instructions about where and how to use these. Pharmacies typically print the medication instructions only on the boxes and not directly on the medication tubes.   If your medication is too expensive, please contact our office at [phone] or send us a message through MyChart.   We are unable to tell what your co-pay for medications will be in advance as this is different depending on your insurance coverage. However, we may be able to find a substitute medication at lower cost or fill out paperwork to get insurance to cover a needed medication.   If a prior authorization is required to get your medication covered by your insurance company, please allow us 1-2 business days to complete this process.  Drug prices often vary depending on where the prescription is filled and some pharmacies may offer cheaper prices.  The website www.goodrx.com contains coupons for medications through different pharmacies. The prices here do not account for what the cost may be with help from insurance (it may be cheaper with your insurance), but the website can give you the price if you did not use any insurance.  - You can print the associated coupon and take it with your prescription to the pharmacy.  - You may also stop by our office during regular business hours and pick up a GoodRx coupon card.  - If you need your   prescription sent electronically to a different pharmacy, notify our office through [facility] MyChart or by phone at [phone]     

## 2022-08-15 NOTE — Progress Notes (Signed)
   Follow-Up Visit   Subjective  Tiffany Roth is a 32 y.o. female who presents for the following: Atopic Dermatitis follow up - She finished Prednisone taper in May - she has been off about 2 weeks. She had a PPD placed 08/12/2022 and got a negative result today. She has not had Hepatitis labs drawn yet.   The following portions of the chart were reviewed this encounter and updated as appropriate: medications, allergies, medical history  Review of Systems:  No other skin or systemic complaints except as noted in HPI or Assessment and Plan.  Objective  Well appearing patient in no apparent distress; mood and affect are within normal limits.   A focused examination was performed of the following areas: Face, arms  Relevant exam findings are noted in the Assessment and Plan.    Assessment & Plan   Atopic Dermatitis Exam: moderate erythema and scale, marked xerosis, IGA 3,    Chronic and persistent condition with duration or expected duration over one year. Condition is symptomatic/ bothersome to patient. Not currently at goal.     Treatment Plan:  Start Rinvoq 15 mg 1 po qd - Samples x 2 given today Lot #[id number] Exp 04/02/2024   Continue Opzelura cream once daily to face.    Continue Clobetasol ointment twice a day up to 2 weeks to affected areas on body. Avoid applying to face, groin, and axilla. Use as directed. Long-term use can cause thinning of the skin.   Labs still pending for Hepatitis Panel - order given again today    Return in about 1 month (around 09/14/2022) for Atopic Dermatitis.  I, Joanie Coddington, CMA, am acting as scribe for [employer], DO .   Documentation: I have reviewed the above documentation for accuracy and completeness, and I agree with the above.  Langston Reusing, DO

## 2022-08-15 NOTE — Progress Notes (Signed)
PPD Reading Note PPD read and results entered in EpicCare. Result: 0 mm induration. Interpretation: negative If test not read within 48-72 hours of initial placement, patient advised to repeat in other arm 1-3 weeks after this test. Allergic reaction: no  Visit originally scheduled as OV instead of NV, had to cancel appt and make new appt with NV as visit type.

## 2022-08-15 NOTE — Progress Notes (Signed)
PPD Reading Note  PPD read and results entered in EpicCare.  Result: 0 mm induration.  Interpretation: negative  If test not read within 48-72 hours of initial placement, patient advised to repeat in other arm 1-3 weeks after this test.  Allergic reaction: no

## 2022-08-19 LAB — ACUTE HEP PANEL AND HEP B SURFACE AB
Hep A IgM: NEGATIVE
Hep B C IgM: NEGATIVE
Hep C Virus Ab: NONREACTIVE
Hepatitis B Surf Ab Quant: 4.3 m[IU]/mL — ABNORMAL LOW (ref >9.9)
Hepatitis B Surface Ag: NEGATIVE

## 2022-08-23 ENCOUNTER — Encounter: Payer: Self-pay | Admitting: Dermatology

## 2022-08-23 ENCOUNTER — Ambulatory Visit: Payer: BC Managed Care – PPO | Admitting: Dermatology

## 2022-08-23 NOTE — Telephone Encounter (Signed)
Hi Jetta,  Can you call pt and see if she can come in tomorrow at 1:15 or 9:30?  Thanks!

## 2022-08-24 ENCOUNTER — Ambulatory Visit: Payer: BC Managed Care – PPO | Admitting: Dermatology

## 2022-08-24 ENCOUNTER — Ambulatory Visit (HOSPITAL_BASED_OUTPATIENT_CLINIC_OR_DEPARTMENT_OTHER)
Admission: RE | Admit: 2022-08-24 | Discharge: 2022-08-24 | Disposition: A | Discharge status: Home / Self Care | Payer: BC Managed Care – PPO | Source: Ambulatory Visit | Attending: Dermatology | Admitting: Dermatology

## 2022-08-24 DIAGNOSIS — R591 Generalized enlarged lymph nodes: Secondary | ICD-10-CM | POA: Diagnosis present

## 2022-08-24 LAB — CBC WITH DIFFERENTIAL/PLATELET
Basophils Absolute: 0.1 10*3/uL (ref 0.0–0.2)
Basos: 1 %
EOS (ABSOLUTE): 0.3 10*3/uL (ref 0.0–0.4)
Eos: 4 %
Hematocrit: 39.1 % (ref 34.0–46.6)
Hemoglobin: 12.6 g/dL (ref 11.1–15.9)
Immature Grans (Abs): 0 10*3/uL (ref 0.0–0.1)
Immature Granulocytes: 0 %
Lymphocytes Absolute: 2.6 10*3/uL (ref 0.7–3.1)
Lymphs: 30 %
MCH: 27.9 pg (ref 26.6–33.0)
MCHC: 32.2 g/dL (ref 31.5–35.7)
MCV: 87 fL (ref 79–97)
Monocytes Absolute: 1 10*3/uL — ABNORMAL HIGH (ref 0.1–0.9)
Monocytes: 11 %
Neutrophils Absolute: 4.7 10*3/uL (ref 1.4–7.0)
Neutrophils: 54 %
Platelets: 405 10*3/uL (ref 150–450)
RBC: 4.52 x10E6/uL (ref 3.77–5.28)
RDW: 13.4 % (ref 11.7–15.4)
WBC: 8.7 10*3/uL (ref 3.4–10.8)

## 2022-08-24 NOTE — Patient Instructions (Signed)
- We will check CBC to check WBC - Malachi Carl Labs to R/O Mono or Hx of Mono -Advised to stop Renvoiq until we can pin point the cause of the swollen,sore lymph nodes -We will order U/S to R/O cyst vs Lymph Nodes  Due to recent changes in healthcare laws, you may see results of your pathology and/or laboratory studies on MyChart before the doctors have had a chance to review them. We understand that in some cases there may be results that are confusing or concerning to you. Please understand that not all results are received at the same time and often the doctors may need to interpret multiple results in order to provide you with the best plan of care or course of treatment. Therefore, we ask that you please give [place] 2 business days to thoroughly review all your results before contacting the office for clarification. Should we see a critical lab result, you will be contacted sooner.   If You Need Anything After Your Visit  If you have any questions or concerns for your doctor, please call our main line at [phone] If no one answers, please leave a voicemail as directed and we will return your call as soon as possible. Messages left after 4 pm will be answered the following business day.   You may also send [place] a message via MyChart. We typically respond to MyChart messages within 1-2 business days.  For prescription refills, please ask your pharmacy to contact our office. Our fax number is [phone].  If you have an urgent issue when the clinic is closed that cannot wait until the next business day, you can page your doctor at the number below.    Please note that while we do our best to be available for urgent issues outside of office hours, we are not available 24/7.   If you have an urgent issue and are unable to reach [place], you may choose to seek medical care at your doctor's office, retail clinic, urgent care center, or emergency room.  If you have a medical emergency, please  immediately call 911 or go to the emergency department. In the event of inclement weather, please call our main line at [phone] for an update on the status of any delays or closures.  Dermatology Medication Tips: Please keep the boxes that topical medications come in in order to help keep track of the instructions about where and how to use these. Pharmacies typically print the medication instructions only on the boxes and not directly on the medication tubes.   If your medication is too expensive, please contact our office at [phone] or send [place] a message through MyChart.   We are unable to tell what your co-pay for medications will be in advance as this is different depending on your insurance coverage. However, we may be able to find a substitute medication at lower cost or fill out paperwork to get insurance to cover a needed medication.   If a prior authorization is required to get your medication covered by your insurance company, please allow [place] 1-2 business days to complete this process.  Drug prices often vary depending on where the prescription is filled and some pharmacies may offer cheaper prices.  The website www.goodrx.com contains coupons for medications through different pharmacies. The prices here do not account for what the cost may be with help from insurance (it may be cheaper with your insurance), but the website can give you the price if you did not  use any insurance.  - You can print the associated coupon and take it with your prescription to the pharmacy.  - You may also stop by our office during regular business hours and pick up a GoodRx coupon card.  - If you need your prescription sent electronically to a different pharmacy, notify our office through [facility] or by phone at [phone]

## 2022-08-24 NOTE — Progress Notes (Signed)
   Follow-Up Visit   Subjective  Tiffany Roth is a 32 y.o. female who presents for the following: Swollen, tender lymph node  Patient present today for follow up visit for new swollen knot on lymph node. Patient was last evaluated on 08/15/22. Patient was given Rinvoq samples at last office visit.Patient reports the growth appeared 3 days ago and and very tender to touch. Patient reports no medication changes.   The following portions of the chart were reviewed this encounter and updated as appropriate: medications, allergies, medical history  Review of Systems:  No other skin or systemic complaints except as noted in HPI or Assessment and Plan.  Objective  Well appearing patient in no apparent distress; mood and affect are within normal limits.  A focused examination was performed of the following areas: Right under jaw line  Relevant exam findings are noted in the Assessment and Plan.   Assessment & Plan   Lymphadenopathy (? Reactive lymph node vs lymphoproliferation secondary to Rinvoq) Exam: ~2cm tender subcutaneous nodule  Treatment Plan: - We will check CBC to check WBC - Malachi Carl Labs to R/O Mono or Hx of Mono -Advised to stop Rinvoq until we can pin point the cause of the swollen sore lymph nodes -We will order U/S to R/O cyst vs Lymph Node  I had a long discussion with pt regarding the potential cause of the enlarging subcutaneous nodule on her right anterior neck.  It's either a cyst or a lymph node.  Palpation on physical exam has me favoring a lymph node.  If it is a lymph node we need to determine if it's reactive secondary to an infection or proliferative as an adverse event from starting Rinvoq.  Pt denies and fever, fatigue, sore throat, cough, ear pain,  or any other systemic symptoms.   Lymphadenopathy  Related Procedures CBC with Differential/Platelets US Soft Tissue Head/Neck (NON-THYROID) Epstein-Barr virus early D antigen antibody, IgG    No  follow-ups on file.   Documentation: I have reviewed the above documentation for accuracy and completeness, and I agree with the above.  Stasia Cavalier, am acting as scribe for Langston Reusing, DO.  Langston Reusing, DO

## 2022-08-25 LAB — EPSTEIN-BARR VIRUS EARLY D ANTIGEN ANTIBODY, IGG: EBV Early Antigen Ab, IgG: <9 U/mL (ref 0.0–8.9)

## 2022-08-25 NOTE — Progress Notes (Signed)
Hi Tiffany Roth,  Your CBC results showed all you blood counts were normal.  Continue to stay off the Rinvoq until we have results from the [place].  Have a great day!  Best,  Dr. Onalee Hua

## 2022-08-29 ENCOUNTER — Encounter: Payer: Self-pay | Admitting: Dermatology

## 2022-08-29 ENCOUNTER — Telehealth: Payer: Self-pay | Admitting: Dermatology

## 2022-08-29 MED ORDER — DOXYCYCLINE HYCLATE 100 MG PO CAPS: 100.0000 mg | ORAL_CAPSULE | Freq: Two times a day (BID) | ORAL | 0 refills | Status: AC

## 2022-08-29 NOTE — Telephone Encounter (Signed)
Patient called in this afternoon, in regards to her medication. It looks like when she attempted to pick up; pharmacy  ( [facility] on [address]) advised that their was no prescription. She would like a call back to [phone] to speak what needs to be done

## 2022-09-04 ENCOUNTER — Encounter: Payer: Self-pay | Admitting: Dermatology

## 2022-09-14 ENCOUNTER — Ambulatory Visit (INDEPENDENT_AMBULATORY_CARE_PROVIDER_SITE_OTHER): Payer: BC Managed Care – PPO | Admitting: Dermatology

## 2022-09-14 ENCOUNTER — Encounter: Payer: Self-pay | Admitting: Dermatology

## 2022-09-14 VITALS — BP 102/60 | HR 93

## 2022-09-14 DIAGNOSIS — L2089 Other atopic dermatitis: Secondary | ICD-10-CM

## 2022-09-14 DIAGNOSIS — L209 Atopic dermatitis, unspecified: Secondary | ICD-10-CM

## 2022-09-14 DIAGNOSIS — R591 Generalized enlarged lymph nodes: Secondary | ICD-10-CM

## 2022-09-14 NOTE — Patient Instructions (Signed)
Atopic Dermatitis  Treatment Plan: -Restart Rinvoq as directed --call immediately if lymph nodes swell again.  Due to recent changes in healthcare laws, you may see results of your pathology and/or laboratory studies on MyChart before the doctors have had a chance to review them. We understand that in some cases there may be results that are confusing or concerning to you. Please understand that not all results are received at the same time and often the doctors may need to interpret multiple results in order to provide you with the best plan of care or course of treatment. Therefore, we ask that you please give [place] 2 business days to thoroughly review all your results before contacting the office for clarification. Should we see a critical lab result, you will be contacted sooner.   If You Need Anything After Your Visit  If you have any questions or concerns for your doctor, please call our main line at [phone] If no one answers, please leave a voicemail as directed and we will return your call as soon as possible. Messages left after 4 pm will be answered the following business day.   You may also send [place] a message via MyChart. We typically respond to MyChart messages within 1-2 business days.  For prescription refills, please ask your pharmacy to contact our office. Our fax number is [phone].  If you have an urgent issue when the clinic is closed that cannot wait until the next business day, you can page your doctor at the number below.    Please note that while we do our best to be available for urgent issues outside of office hours, we are not available 24/7.   If you have an urgent issue and are unable to reach [place], you may choose to seek medical care at your doctor's office, retail clinic, urgent care center, or emergency room.  If you have a medical emergency, please immediately call 911 or go to the emergency department. In the event of inclement weather, please call our main  line at [phone] for an update on the status of any delays or closures.  Dermatology Medication Tips: Please keep the boxes that topical medications come in in order to help keep track of the instructions about where and how to use these. Pharmacies typically print the medication instructions only on the boxes and not directly on the medication tubes.   If your medication is too expensive, please contact our office at [phone] or send [place] a message through MyChart.   We are unable to tell what your co-pay for medications will be in advance as this is different depending on your insurance coverage. However, we may be able to find a substitute medication at lower cost or fill out paperwork to get insurance to cover a needed medication.   If a prior authorization is required to get your medication covered by your insurance company, please allow [place] 1-2 business days to complete this process.  Drug prices often vary depending on where the prescription is filled and some pharmacies may offer cheaper prices.  The website www.goodrx.com contains coupons for medications through different pharmacies. The prices here do not account for what the cost may be with help from insurance (it may be cheaper with your insurance), but the website can give you the price if you did not use any insurance.  - You can print the associated coupon and take it with your prescription to the pharmacy.  - You may also stop by our office  during regular business hours and pick up a GoodRx coupon card.  - If you need your prescription sent electronically to a different pharmacy, notify our office through [facility] or by phone at [phone]

## 2022-09-14 NOTE — Progress Notes (Signed)
   Follow-Up Visit   Subjective  Tiffany Roth is a 32 y.o. female who presents for the following: Atopic Dermatitis that was all over the body. Pt had been given a sample of Rinvoq but had a reaction and stopped. Pt was then given Doxycycline 100mg  which she took for 1 week. She has been using Eucerin after bathing as directed. She said things are doing very well now.  She had a reactive lymphnode but it is significantly smaller.    The following portions of the chart were reviewed this encounter and updated as appropriate: medications, allergies, medical history  Review of Systems:  No other skin or systemic complaints except as noted in HPI or Assessment and Plan.  Objective  Well appearing patient in no apparent distress; mood and affect are within normal limits.  Areas Examined: Face, arms, chest - mildly eczematous plaques involving arms and neck   Relevant physical exam findings are noted in the Assessment and Plan.  Continue process of [place]  Assessment & Plan      ATOPIC DERMATITIS   Not at goal,(face has improved however BSA still >30%, IGA 2)  Treatment Plan: -Reviewed labs (WBC level WNL) an imaging results confirm a reactive lymph node. -Restart Rinvoq as directed --call immediately if lymph nodes swell again. -Samples of Rinvoq given, PA and status in [place] Updated  -Recommend gentle skin care.  Less than 1cm, non-tender, palpable nodule on right side.  No follow-ups on file.  Owens Shark, CMA, am acting as scribe for [employer], DO.   Documentation: I have reviewed the above documentation for accuracy and completeness, and I agree with the above.  Langston Reusing, DO

## 2022-09-28 ENCOUNTER — Encounter: Payer: Self-pay | Admitting: Dermatology

## 2022-10-08 ENCOUNTER — Other Ambulatory Visit: Payer: Self-pay | Admitting: Family Medicine

## 2022-10-08 DIAGNOSIS — J310 Chronic rhinitis: Secondary | ICD-10-CM

## 2022-10-26 ENCOUNTER — Ambulatory Visit: Payer: BC Managed Care – PPO | Admitting: Dermatology

## 2022-12-08 ENCOUNTER — Ambulatory Visit: Payer: BC Managed Care – PPO | Admitting: Dermatology

## 2023-04-03 ENCOUNTER — Encounter: Payer: Self-pay | Admitting: Family Medicine

## 2023-04-03 ENCOUNTER — Ambulatory Visit: Payer: 59 | Admitting: Family Medicine

## 2023-04-03 VITALS — BP 110/64 | HR 89 | Temp 98.3°F | Wt 260.0 lb

## 2023-04-03 DIAGNOSIS — J019 Acute sinusitis, unspecified: Secondary | ICD-10-CM

## 2023-04-03 MED ORDER — AMOXICILLIN-POT CLAVULANATE 875-125 MG PO TABS: 1.0000 | ORAL_TABLET | Freq: Two times a day (BID) | ORAL | 0 refills | Status: DC

## 2023-04-03 NOTE — Progress Notes (Signed)
   Subjective:    Patient ID: Tiffany Roth, female    DOB: 1990-11-30, 33 y.o.   MRN: [id number]  HPI Here for 4 weeks of sinus pressure, pain in both ears, PND, and coughing up yellow mucus. No fever or ST. Using Tylenol Sinus. She has tested negative for Covid several times at home.    Review of Systems  Constitutional: Negative.   HENT:  Positive for congestion, ear pain, postnasal drip and sinus pressure. Negative for sore throat.   Eyes: Negative.   Respiratory:  Positive for cough. Negative for shortness of breath and wheezing.        Objective:   Physical Exam Constitutional:      Appearance: Normal appearance. She is not ill-appearing.  HENT:     Right Ear: Tympanic membrane, ear canal and external ear normal.     Left Ear: Tympanic membrane, ear canal and external ear normal.     Nose: Nose normal.     Mouth/Throat:     Pharynx: Oropharynx is clear.  Eyes:     Conjunctiva/sclera: Conjunctivae normal.  Pulmonary:     Effort: Pulmonary effort is normal.     Breath sounds: Normal breath sounds.  Lymphadenopathy:     Cervical: No cervical adenopathy.  Neurological:     Mental Status: She is alert.           Assessment & Plan:  Sinusitis, treat with 10 days of Augmentin. Add Mucinex as needed. Gershon Crane, MD

## 2023-04-06 ENCOUNTER — Encounter: Payer: Self-pay | Admitting: Dermatology

## 2023-04-06 ENCOUNTER — Ambulatory Visit: Payer: 59 | Admitting: Dermatology

## 2023-04-06 VITALS — BP 159/64

## 2023-04-06 DIAGNOSIS — L209 Atopic dermatitis, unspecified: Secondary | ICD-10-CM | POA: Diagnosis not present

## 2023-04-06 DIAGNOSIS — L309 Dermatitis, unspecified: Secondary | ICD-10-CM

## 2023-04-06 MED ORDER — HYDROCORTISONE 2.5 % EX CREA: TOPICAL_CREAM | Freq: Two times a day (BID) | CUTANEOUS | 2 refills | Status: DC

## 2023-04-06 MED ORDER — PIMECROLIMUS 1 % EX CREA: TOPICAL_CREAM | Freq: Two times a day (BID) | CUTANEOUS | 2 refills | Status: DC

## 2023-04-06 MED ORDER — TRIAMCINOLONE ACETONIDE 0.1 % EX CREA: 1.0000 | TOPICAL_CREAM | Freq: Two times a day (BID) | CUTANEOUS | 2 refills | Status: DC

## 2023-04-06 NOTE — Patient Instructions (Signed)
Hello  Tiffany Roth ,  Thank you for visiting our office today.  Below is a summary of our discussion and your tailored treatment plan:  Medications Prescribed:   Hydrocortisone 2.5% Cream: Apply twice daily for the first two weeks.   Tacrolimus Cream: Begin use following the initial two-week period with hydrocortisone.   Pimecrolimus Cream: Alternate with tacrolimus as directed.   Triamcinolone Cream: Apply twice daily, alternating with pimecrolimus for application on the body.   Moisturization: Continue applying Aquaphor or a similar thick moisturizer atop creams to enhance their effect.  Biologic Treatment:   Rinvoq: Discontinue use due to adverse side effects.   Ebglyss Application: Submission will be made for Ebglyss; approval is anticipated within approximately three weeks.  Supportive Care:   Skincare: Utilize the CeraVe Anti-Itch line for daily skincare and itch relief.   Treatment Regimen: Adhere to the discussed two weeks on, two weeks off regimen.  Follow-Up:   Next Appointment: Schedule a return visit in two months to evaluate progress and potentially initiate Ebglyss treatment upon approval.   Biologic Application Form: You will sign a form today for the new biologic treatment application.   We thank you once again for your patience and cooperation today. We eagerly anticipate your next visit and are hopeful for a favorable response from your insurance regarding the new treatment plan.  Warm regards,  Dr. Langston Reusing [facility]

## 2023-04-06 NOTE — Progress Notes (Unsigned)
   Follow-Up Visit   Subjective  Tiffany Roth is a 33 y.o. female who presents for the following: Atopic dermatitis - Rinvoq was too expensive $350 each month. She is not currently using anything for eczema. She would like to discuss the discoloration of her face. She has failed Dupixent in the past. She did have some lip swelling while she was on Rinvoq.   The following portions of the chart were reviewed this encounter and updated as appropriate: medications, allergies, medical history  Review of Systems:  No other skin or systemic complaints except as noted in HPI or Assessment and Plan.  Objective  Well appearing patient in no apparent distress; mood and affect are within normal limits.   A focused examination was performed of the following areas:   Relevant exam findings are noted in the Assessment and Plan.    Assessment & Plan   ATOPIC DERMATITIS Exam: Scaly pink papules coalescing to plaques >30% BSA  Chronic and persistent condition with duration or expected duration over one year. Condition is bothersome/symptomatic for patient. Currently flared.   Atopic dermatitis (eczema) is a chronic, relapsing, pruritic condition that can significantly affect quality of life. It is often associated with allergic rhinitis and/or asthma and can require treatment with topical medications, phototherapy, or in severe cases biologic injectable medication (Dupixent; Adbry) or Oral JAK inhibitors.  Treatment Plan: Hydrocortisone 2.5% cream twice daily x 2 weeks to face alternating every 2 weeks with Pimecrolimus cream twice daily.  Triamcinolone 0.1% cream twice daily to affected areas of body x 2 weeks alternating every 2 weeks with Pimecrolimus cream twice daily.  Continue Aquaphor to moisturize.  Discontinue Rinvoq due to side effects. Will plan to start Ebglyss pending approval.  Recommend Cerave Anti-itch lotion.  Recommend gentle skin care.     Return in about 2  months (around 06/04/2023) for Follow up.  I, Joanie Coddington, CMA, am acting as scribe for [employer], DO .   Documentation: I have reviewed the above documentation for accuracy and completeness, and I agree with the above.  Langston Reusing, DO

## 2023-04-10 MED ORDER — EBGLYSS 250 MG/2ML ~~LOC~~ SOAJ: 500.0000 mg | SUBCUTANEOUS | 0 refills | Status: AC

## 2023-04-10 MED ORDER — EBGLYSS 250 MG/2ML ~~LOC~~ SOAJ: 250.0000 mg | SUBCUTANEOUS | 2 refills | Status: AC

## 2023-04-10 MED ORDER — EBGLYSS 250 MG/2ML ~~LOC~~ SOAJ: 250.0000 mg | SUBCUTANEOUS | 6 refills | Status: AC

## 2023-06-08 ENCOUNTER — Ambulatory Visit: Payer: 59 | Admitting: Dermatology

## 2023-07-05 ENCOUNTER — Ambulatory Visit: Admitting: Dermatology

## 2023-07-17 ENCOUNTER — Encounter: Payer: Self-pay | Admitting: Family Medicine

## 2023-07-17 ENCOUNTER — Ambulatory Visit (INDEPENDENT_AMBULATORY_CARE_PROVIDER_SITE_OTHER): Admitting: Family Medicine

## 2023-07-17 VITALS — BP 135/85 | HR 100 | Temp 98.0°F | Resp 12 | Ht 61.0 in | Wt 256.0 lb

## 2023-07-17 DIAGNOSIS — J069 Acute upper respiratory infection, unspecified: Secondary | ICD-10-CM

## 2023-07-17 DIAGNOSIS — R0981 Nasal congestion: Secondary | ICD-10-CM | POA: Diagnosis not present

## 2023-07-17 DIAGNOSIS — R051 Acute cough: Secondary | ICD-10-CM | POA: Diagnosis not present

## 2023-07-17 DIAGNOSIS — R030 Elevated blood-pressure reading, without diagnosis of hypertension: Secondary | ICD-10-CM

## 2023-07-17 MED ORDER — BENZONATATE 100 MG PO CAPS
100.0000 mg | ORAL_CAPSULE | Freq: Two times a day (BID) | ORAL | 0 refills | Status: AC | PRN
Start: 2023-07-17 — End: 2023-07-27

## 2023-07-17 MED ORDER — AMOXICILLIN-POT CLAVULANATE 875-125 MG PO TABS: 1.0000 | ORAL_TABLET | Freq: Two times a day (BID) | ORAL | 0 refills | Status: AC

## 2023-07-17 NOTE — Progress Notes (Signed)
 ACUTE VISIT Chief Complaint  Patient presents with   Sinusitis    Started last Tuesday, took covid tests at home that were negative; cough, yellow/green mucus, runny nose, congestion.    HPI: Tiffany Roth is a 33 y.o. female with past medical history significant for allergies, eczema, prediabetes, and OSA who is here today complaining of sore throat, productive cough, post-nasal drainage, nasal congestion, periocular and temporal headaches, chills, and wheezing when coughing.  Onset of symptoms was 6 days ago.   Sinusitis This is a new problem. The current episode started in the past 7 days. The problem is unchanged. There has been no fever. Associated symptoms include chills, congestion, coughing, headaches, sinus pressure and sneezing. Pertinent negatives include no diaphoresis, ear pain, hoarse voice, neck pain, shortness of breath or swollen glands. The treatment provided no relief.   Pressure like headache, intermittent. No associated nausea,vomiting,or photophobia.  Has been taking Vicks Sinex for her current symptoms and takes Flonase  to manage her severe allergies.  She has taken multiple at-home COVID tests, all were negative.  No known fever, she has not checked her temperature.  No known recent exposure to URI illnesses, although she notes she did graduate on 5/1 and was in a large crowd.  No hx of asthma.   Similar symptoms in 07/2022 and treated with abx in 03/2023.  BP elevated today, has been elevated in the past. She is not checking BP at home. 04/06/23 BP was 159/64.  Review of Systems  Constitutional:  Positive for chills. Negative for diaphoresis.  HENT:  Positive for congestion, sinus pressure and sneezing. Negative for ear pain, hoarse voice, mouth sores and nosebleeds.   Eyes:  Negative for discharge and redness.  Respiratory:  Positive for cough. Negative for shortness of breath and stridor.   Cardiovascular:  Negative for chest pain and leg swelling.   Gastrointestinal:  Negative for abdominal pain.  Musculoskeletal:  Negative for joint swelling and neck pain.  Skin:  Positive for rash (chronic, eczema).  Allergic/Immunologic: Positive for environmental allergies.  Neurological:  Positive for headaches. Negative for syncope and weakness.  See other pertinent positives and negatives in HPI.  Current Outpatient Medications on File Prior to Visit  Medication Sig Dispense Refill   acetaminophen  (TYLENOL ) 325 MG tablet Take 2 tablets (650 mg total) by mouth every 6 (six) hours as needed for mild pain. 30 tablet 1   cetirizine  (ZYRTEC  ALLERGY) 10 MG tablet Take 1 tablet (10 mg total) by mouth daily. 90 tablet 3   clobetasol  ointment (TEMOVATE ) 0.05 % Apply 1 Application topically 2 (two) times daily. Apply topically to the body twice a day for up to two weeks. Mix with Eucerin for easier spread 30 g 2   clotrimazole -betamethasone  (LOTRISONE ) cream Apply 1 application. topically daily. 45 g 0   desmopressin  (DDAVP ) 0.1 MG tablet Take 0.1 mg by mouth daily.     doxepin  (SINEQUAN ) 10 MG capsule Take 1 capsule (10 mg total) by mouth at bedtime as needed. 30 capsule 1   ferrous sulfate  325 (65 FE) MG EC tablet Take 1 tablet (325 mg total) by mouth 2 (two) times daily with a meal. 90 tablet 11   fluticasone  (FLONASE ) 50 MCG/ACT nasal spray PLACE 1 SPRAY INTO BOTH NOSTRILS DAILY AS NEEDED. 48 mL 1   hydrocortisone  2.5 % cream Apply topically 2 (two) times daily. Apply to affected areas of face x 2 weeks. Alternate every 2 weeks with Pimecrolimus . 60 g 2  hydrOXYzine  (VISTARIL ) 25 MG capsule Take 1-2 capsules (25-50 mg total) by mouth at bedtime as needed. 60 capsule 0   ketoconazole  (NIZORAL ) 2 % shampoo Apply 1 application. topically 2 (two) times a week. 120 mL 0   lebrikizumab-lbkz (EBGLYSS) 250 MG/2ML SOAJ injection Inject 4 mLs (500 mg total) into the skin as directed. Inject 2 pens subcutaneously at week 0 and week 2 8 mL 0   lebrikizumab-lbkz  (EBGLYSS) 250 MG/2ML SOAJ injection Inject 2 mLs (250 mg total) into the skin as directed. Inject one pen subcutaneously every 2 weeks until week 16 4 mL 2   lebrikizumab-lbkz (EBGLYSS) 250 MG/2ML SOAJ injection Inject 2 mLs (250 mg total) into the skin every 30 (thirty) days. 2 mL 6   Multiple Vitamin (MULTIVITAMIN ADULT PO) Take 1 tablet by mouth daily.     nystatin -triamcinolone  (MYCOLOG II) cream Apply 1 Application topically 2 (two) times daily as needed. 30 g 1   pimecrolimus  (ELIDEL ) 1 % cream Apply topically 2 (two) times daily. 30 g 0   pimecrolimus  (ELIDEL ) 1 % cream Apply topically 2 (two) times daily. Apply to affected areas of face and body x 2 weeks. Alternate every 2 weeks with Hydrocortisone  for face and Triamcinolone  for body. 60 g 2   polyethylene glycol (MIRALAX  / GLYCOLAX ) 17 g packet Take 17 g by mouth 2 (two) times daily. Until bm is regular 14 each 0   triamcinolone  cream (KENALOG ) 0.1 % Apply 1 application topically 2 (two) times daily as needed. Do not apply to face, groin or armpits (Patient taking differently: Apply 1 application  topically 2 (two) times daily as needed (Eczema). Do not apply to face, groin or armpits) 453 g 3   triamcinolone  cream (KENALOG ) 0.1 % Apply 1 Application topically 2 (two) times daily. Apply to affected areas of body x 2 weeks. Alternate every 2 weeks with Pimecrolimus . 453 g 2   No current facility-administered medications on file prior to visit.    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   Allergies  Allergen Reactions   Dairycare [Bacid] Nausea And Vomiting    Dairy products   Dupixent [Dupilumab]     Worsening of rash   Ibuprofen Other (See Comments)    DR told her not to take   Other Nausea And Vomiting    DAIRY products, nickel    Social History   Socioeconomic History   Marital status: Single    Spouse name: Not on file   Number of children: Not on file   Years of education: Not on file   Highest education level: Not on file  Occupational History   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    Doing much better less stress   Going to grad school in august .  [place] in [employer] education .    Social Drivers of Corporate investment banker Strain: Not on file  Food Insecurity: Not on file  Transportation Needs: Not on file  Physical Activity: Not on file  Stress: Not on file  Social Connections: Not on file    Vitals:   07/17/23 1450 07/17/23 1516  BP: (!) 128/90 135/85  Pulse: 100   Resp: 12   Temp: 98 F (36.7 C)   SpO2: 95%    Body mass index is 48.37 kg/m.  Physical Exam Vitals and nursing note reviewed.  Constitutional:      General: She is not in acute distress.    Appearance: She is well-developed. She is not ill-appearing.  HENT:     Head: Normocephalic and atraumatic.     Right Ear: Tympanic membrane, ear canal and external ear normal.     Left Ear: Tympanic  membrane, ear canal and external ear normal.     Nose: Congestion and rhinorrhea (copious, clear) present.     Right Turbinates: Enlarged.     Left Turbinates: Enlarged.     Right Sinus: Maxillary sinus tenderness and frontal sinus tenderness present.     Left Sinus: Maxillary sinus tenderness and frontal sinus tenderness present.     Mouth/Throat:     Mouth: Mucous membranes are moist.  Eyes:     Conjunctiva/sclera: Conjunctivae normal.  Cardiovascular:     Rate and Rhythm: Normal rate and regular rhythm.     Heart sounds: No murmur heard. Pulmonary:     Effort: Pulmonary effort is normal. No respiratory distress.     Breath sounds: Normal breath sounds. No stridor.  Lymphadenopathy:     Head:     Right side of head: No submandibular adenopathy.     Left side of head: No submandibular adenopathy.     Cervical: No cervical adenopathy.  Skin:    General: Skin is warm.     Findings: No erythema or rash.  Neurological:     General: No focal deficit present.     Mental Status: She is alert and oriented to person, place, and time.     Gait: Gait normal.  Psychiatric:        Mood and Affect: Mood and affect normal.   ASSESSMENT AND PLAN: Tiffany Roth was seen today for evaluation of sinus issues.  Nasal sinus congestion Explained that it is most likely caused by a viral illness. I do not abx will help at this time. We discussed differential diagnosis. Explained that I do not think antibiotic treatment will help at this time, most likely viral illness aggravated by seasonal allergies. Recommend continuing with Flonase  nasal spray at bedtime as needed and during the day nasal saline irrigations as needed. Monitor for new symptoms. If not greatly improved in 5 days, she can go ahead and start antibiotic treatment, we discussed some  side effects.  Acute cough Lung auscultation negative today, so I do not think imaging is needed today. Benzonatate 100 mg twice daily as  needed and OTC plain Mucinex recommended for cough management. Adequate hydration.  -     Benzonatate; Take 1 capsule (100 mg total) by mouth 2 (two) times daily as needed for up to 10 days.  Dispense: 20 capsule; Refill: 0  URI, acute Viral most likely. Continue symptomatic treatment. Monitor temp.  Other orders -     Amoxicillin -Pot Clavulanate; Take 1 tablet by mouth 2 (two) times daily for 7 days.  Dispense: 14 tablet; Refill: 0  Elevated blood pressure reading Improved after a few minutes but still above goal. Recommend monitoring BP at home.  Return if symptoms worsen or fail to improve, for keep next appointment.  I, Bernita Bristle, acting as a scribe for Maizy Davanzo [place], MD., have documented all relevant documentation on the behalf of Tiffany Barsky [place], MD, as directed by   while in the presence of Lallie Strahm [place], MD.  I, Takeila Thayne [place], MD, have reviewed all documentation for this visit. The documentation on 07/17/23 for the exam, diagnosis, procedures, and orders are all accurate and complete.  Michol Emory G. [place], MD  [facility]. [place] office.

## 2023-07-17 NOTE — Patient Instructions (Addendum)
 A few things to remember from today's visit:  Nasal sinus congestion  Acute cough - Plan: benzonatate (TESSALON) 100 MG capsule  URI, acute  Elevated blood pressure reading I do not think antibiotic treatment will be effective at this time. It seems viral and aggravated by allergies. Continue Flonase  nasal spray at night and nasal saline irrigations as needed during the day. Monitor for fever. If there is not any improvement in 5 days you can try antibiotic treatment.  Caution with cold meds. Monitor blood pressure at home. Plain Mucinex may help.  If you need refills for medications you take chronically, please call your pharmacy. Do not use My Chart to request refills or for acute issues that need immediate attention. If you send a my chart message, it may take a few days to be addressed, specially if I am not in the office.  Please be sure medication list is accurate. If a new problem present, please set up appointment sooner than planned today.

## 2023-08-01 ENCOUNTER — Telehealth (INDEPENDENT_AMBULATORY_CARE_PROVIDER_SITE_OTHER): Admitting: Family Medicine

## 2023-08-01 ENCOUNTER — Encounter: Payer: Self-pay | Admitting: Family Medicine

## 2023-08-01 VITALS — Ht 61.0 in

## 2023-08-01 DIAGNOSIS — Z6841 Body Mass Index (BMI) 40.0 and over, adult: Secondary | ICD-10-CM

## 2023-08-01 DIAGNOSIS — E66813 Obesity, class 3: Secondary | ICD-10-CM | POA: Diagnosis not present

## 2023-08-01 NOTE — Progress Notes (Signed)
 Virtual Visit via Video Note I connected with Tiffany Roth on 08/01/2023 by a video enabled telemedicine application and verified that I am speaking with the correct person using two identifiers. Location patient: home Location provider:work office Persons participating in the virtual visit: patient, provider scribe  I discussed the limitations of evaluation and management by telemedicine and the availability of in person appointments. The patient expressed understanding and agreed to proceed.  Chief Complaint  Patient presents with   Medical Management of Chronic Issues   HPI: Ms. Tiffany Roth. Ruppe is a 33 y.o. female with a PMHx significant for allergies, eczema, prediabetes, and OSA who is being seen on video today for medication request, zepbound for wt loss.  Patient says she has been following with the weight loss clinic at [facility] for about a year, but says her program is ending. She is interested in seeing another clinic.  She has tried Ozempic  in the past and had a skin reaction, made her eczema worse. Phentermine  caused tachycardia. Exercises: She works out with a Psychologist, educational about 3 times per week.  Diet: She is tracking calories and eating about 2100 per day.  She has had a gastric sleeve procedure done in the past.  OSA and per records, she is not wearing CPAP.  She follows with nephrologist for diabetes insipidus.  ROS: See pertinent positives and negatives per HPI.  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   Prematurity    "3 months" 1# 15 oz" ventilator    Sinus infection    recently diagnosed currently on antibiotic will  have completed dosage prior to surgery    Sleep apnea    uses CPAP    Varicose veins    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    Past Surgical History:  Procedure Laterality Date   LAPAROSCOPIC GASTRIC SLEEVE RESECTION N/A 06/02/2014   Procedure: LAPAROSCOPIC GASTRIC SLEEVE RESECTION;  Surgeon: Aldean Hummingbird, MD;  Location: [facility];  Service: General;  Laterality: N/A;   MYOMECTOMY N/A 09/08/2021   Procedure: ABDOMINAL MYOMECTOMY;  Surgeon: Marylu Soda, MD;  Location: [facility];  Service: Gynecology;  Laterality: N/A;   TYMPANOSTOMY TUBE PLACEMENT     childhood   UPPER GI ENDOSCOPY N/A 06/02/2014   Procedure: UPPER GI ENDOSCOPY;  Surgeon: Aldean Hummingbird, MD;  Location: [facility];  Service: General;  Laterality: N/A;   varicose veins      surgically repaired 2011    Family History  Problem Relation Age of Onset   Thyroid  disease Mother    Hypertension Mother     Social History   Socioeconomic History   Marital status: Single    Spouse name: Not on file   Number of children: Not on file   Years of education: Not on file   Highest education level: Not on file  Occupational History   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    Doing much better less stress   Going to grad school in august .  [place] in [employer] education .    Social Drivers of Corporate investment banker Strain: Not on file  Food Insecurity: Not on file  Transportation Needs: Not on file  Physical Activity: Not on file  Stress: Not on file  Social Connections: Not on file  Intimate  Partner Violence: Not on file     Current Outpatient Medications:    acetaminophen  (TYLENOL ) 325 MG tablet, Take 2 tablets (650 mg total) by mouth every 6 (six) hours as needed for mild pain., Disp: 30 tablet, Rfl: 1   cetirizine  (ZYRTEC  ALLERGY) 10 MG tablet, Take 1 tablet (10 mg total) by mouth daily., Disp: 90 tablet, Rfl: 3   clobetasol  ointment (TEMOVATE ) 0.05 %, Apply 1 Application topically 2 (two) times daily. Apply topically to the body twice a day for up to two weeks. Mix with Eucerin for easier spread, Disp: 30 g, Rfl: 2   clotrimazole -betamethasone  (LOTRISONE ) cream, Apply 1 application. topically daily., Disp: 45 g, Rfl: 0   desmopressin  (DDAVP ) 0.1 MG tablet, Take 0.1 mg by mouth daily., Disp: , Rfl:    doxepin  (SINEQUAN ) 10 MG capsule, Take 1 capsule (10 mg total) by mouth at bedtime as needed., Disp: 30 capsule, Rfl: 1   ferrous sulfate  325 (65 FE) MG EC tablet, Take 1 tablet (325 mg total) by mouth 2 (two) times daily with a meal., Disp: 90 tablet, Rfl: 11   fluticasone  (FLONASE ) 50 MCG/ACT nasal spray, PLACE 1 SPRAY INTO BOTH NOSTRILS DAILY AS NEEDED., Disp: 48 mL, Rfl: 1   hydrocortisone  2.5 % cream, Apply topically 2 (two) times daily. Apply to affected areas of face x 2 weeks. Alternate every 2 weeks with Pimecrolimus ., Disp: 60 g, Rfl: 2   hydrOXYzine  (VISTARIL ) 25 MG capsule, Take 1-2 capsules (25-50 mg total) by mouth at bedtime as needed., Disp: 60 capsule, Rfl: 0   ketoconazole  (NIZORAL ) 2 % shampoo, Apply 1 application. topically 2 (two) times a week., Disp: 120 mL, Rfl: 0   lebrikizumab-lbkz (EBGLYSS) 250 MG/2ML SOAJ injection, Inject 4 mLs (500 mg total) into the skin as directed. Inject 2 pens subcutaneously at week 0 and week 2, Disp: 8 mL, Rfl: 0   lebrikizumab-lbkz (EBGLYSS) 250 MG/2ML SOAJ injection, Inject 2 mLs (250 mg total) into the skin as directed. Inject one pen subcutaneously every 2 weeks until week 16, Disp: 4 mL, Rfl: 2   lebrikizumab-lbkz (EBGLYSS) 250  MG/2ML SOAJ injection, Inject 2 mLs (250 mg total) into the skin every 30 (thirty) days., Disp: 2 mL, Rfl: 6   Multiple Vitamin (MULTIVITAMIN ADULT PO), Take 1 tablet by mouth daily., Disp: , Rfl:    nystatin -triamcinolone  (MYCOLOG II) cream, Apply 1 Application topically 2 (two) times daily as needed., Disp: 30 g, Rfl: 1   pimecrolimus  (ELIDEL ) 1 % cream, Apply topically 2 (two) times daily., Disp: 30 g, Rfl: 0   pimecrolimus  (ELIDEL ) 1 % cream, Apply topically 2 (two) times daily. Apply to affected areas of face and body x 2 weeks. Alternate every 2  weeks with Hydrocortisone  for face and Triamcinolone  for body., Disp: 60 g, Rfl: 2   polyethylene glycol (MIRALAX  / GLYCOLAX ) 17 g packet, Take 17 g by mouth 2 (two) times daily. Until bm is regular, Disp: 14 each, Rfl: 0   triamcinolone  cream (KENALOG ) 0.1 %, Apply 1 application topically 2 (two) times daily as needed. Do not apply to face, groin or armpits (Patient taking differently: Apply 1 application  topically 2 (two) times daily as needed (Eczema). Do not apply to face, groin or armpits), Disp: 453 g, Rfl: 3   triamcinolone  cream (KENALOG ) 0.1 %, Apply 1 Application topically 2 (two) times daily. Apply to affected areas of body x 2 weeks. Alternate every 2 weeks with Pimecrolimus ., Disp: 453 g, Rfl: 2  EXAM:  VITALS per patient if applicable:Ht 5\' 1"  (1.549 m)   BMI 48.37 kg/m   GENERAL: alert, oriented, appears well and in no acute distress  HEENT: atraumatic, conjunctiva clear, no obvious abnormalities on inspection of external nose and ears  NECK: normal movements of the head and neck  LUNGS: on inspection no signs of respiratory distress, breathing rate appears normal, no obvious gross SOB, gasping or wheezing  CV: no obvious cyanosis  MS: moves all visible extremities without noticeable abnormality  PSYCH/NEURO: pleasant and cooperative, no obvious depression or anxiety, speech and thought processing grossly intact  ASSESSMENT  AND PLAN:  Discussed the following assessment and plan:  Class 3 severe obesity with body mass index (BMI) of 45.0 to 49.9 in adult - Plan: Amb Ref to Medical Weight Management  She understands the benefits of wt loss as well as adverse effects of obesity. Consistency with healthy diet and physical activity encouraged. She has had gastric sleeve done, tried Wellbutrin,phentermine , and Ozempic , the latter one aggravated her eczema. Recommend following 1500 kcal or less. Making positive life style changes, small steps at the time, will provide long lasting results with minimal risk for adverse effects; so I do not recommend pharmacologic treatment. She agrees with referral to healthy wt and wellness clinic.  We discussed possible serious and likely etiologies, options for evaluation and workup, limitations of telemedicine visit vs in person visit, treatment, treatment risks and precautions. The patient was advised to call back or seek an in-person evaluation if the symptoms worsen or if the condition fails to improve as anticipated. I discussed the assessment and treatment plan with the patient. The patient was provided an opportunity to ask questions and all were answered. The patient agreed with the plan and demonstrated an understanding of the instructions.  Return if symptoms worsen or fail to improve, for keep next appointment.  I, Fritz Jewel Wierda, acting as a scribe for Shelanda Duvall [place], MD., have documented all relevant documentation on the behalf of Eureka Valdes [place], MD, as directed by  Kruze Atchley [place], MD while in the presence of Leola Fiore [place], MD.   I, Rakwon Letourneau [place], MD, have reviewed all documentation for this visit. The documentation on 08/01/23 for the exam, diagnosis, procedures, and orders are all accurate and complete.  Nala Kachel [place], MD

## 2023-08-22 LAB — LAB REPORT - SCANNED
Creatinine, POC: 22 mg/dL
EGFR: 53

## 2023-09-04 ENCOUNTER — Encounter (INDEPENDENT_AMBULATORY_CARE_PROVIDER_SITE_OTHER): Payer: Self-pay

## 2023-10-05 ENCOUNTER — Ambulatory Visit (INDEPENDENT_AMBULATORY_CARE_PROVIDER_SITE_OTHER): Admitting: Adult Health

## 2023-10-05 ENCOUNTER — Encounter (INDEPENDENT_AMBULATORY_CARE_PROVIDER_SITE_OTHER): Payer: Self-pay | Admitting: Adult Health

## 2023-10-05 VITALS — BP 115/76 | HR 84 | Temp 98.6°F | Ht 61.0 in | Wt 250.0 lb

## 2023-10-05 DIAGNOSIS — D500 Iron deficiency anemia secondary to blood loss (chronic): Secondary | ICD-10-CM

## 2023-10-05 DIAGNOSIS — G4733 Obstructive sleep apnea (adult) (pediatric): Secondary | ICD-10-CM

## 2023-10-05 DIAGNOSIS — R7303 Prediabetes: Secondary | ICD-10-CM | POA: Diagnosis not present

## 2023-10-05 DIAGNOSIS — Z9884 Bariatric surgery status: Secondary | ICD-10-CM

## 2023-10-05 DIAGNOSIS — E559 Vitamin D deficiency, unspecified: Secondary | ICD-10-CM | POA: Diagnosis not present

## 2023-10-05 DIAGNOSIS — E039 Hypothyroidism, unspecified: Secondary | ICD-10-CM | POA: Diagnosis not present

## 2023-10-05 DIAGNOSIS — E232 Diabetes insipidus: Secondary | ICD-10-CM | POA: Diagnosis not present

## 2023-10-05 DIAGNOSIS — Z0289 Encounter for other administrative examinations: Secondary | ICD-10-CM

## 2023-10-05 DIAGNOSIS — Z6841 Body Mass Index (BMI) 40.0 and over, adult: Secondary | ICD-10-CM

## 2023-10-05 NOTE — Progress Notes (Signed)
 Office: [phone]  /  Fax: [phone]   Initial Visit    Tiffany Roth was seen in clinic today to evaluate for obesity. She is interested in losing weight to improve overall health and reduce the risk of weight related complications. She presents today to review program treatment options, initial physical assessment, and evaluation.     She was referred by: PCP  When asked what else they would like to accomplish? She states: Adopt a healthier eating pattern and lifestyle, Improve energy levels and physical activity, Improve existing medical conditions, Improve quality of life, and Current Weight 250 lbs, Goal Weight 130 lbs Increase muscle mass and reduce body fat %  When asked how has your weight affected you? She states: Having poor endurance and Problems with eating patterns  Weight history: Lifelong concern with elevated BMI  Highest weight: 284 lbs  Some associated conditions: OSA, Prediabetes, and Vitamin D  Deficiency  Contributing factors: disruption of circadian rhythm / sleep disordered breathing, moderate to high levels of stress, and sedentary job  Weight promoting medications identified: None  Prior weight loss attempts: [facility]  Current nutrition plan: None  Current level of physical activity: Other: Runner, broadcasting/film/video with Chief Operating Officer at [employer]  Current or previous pharmacotherapy: GLP-1 and Phentermine   Response to medication: GLP-1 caused sig SE, Phentermine  only on Rx for short period of time   Past medical history includes:   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  Body Fat%:48.3, Visceral Fat Rating:14, Weight trend over the last 12 months: Decreasing  General:  Alert, oriented and cooperative. Patient is in no acute distress.  Respiratory: Normal respiratory effort, no problems with respiration noted   Gait: able to ambulate independently  Mental Status: Normal mood and affect. Normal behavior. Normal judgment and thought content.   DIAGNOSTIC DATA REVIEWED:  BMET    Component Value Date/Time   NA 147 (H) 07/01/2022 1538   K 4.2 07/01/2022 1538   CL 110 (H) 07/01/2022 1538   CO2 22 07/01/2022 1538   GLUCOSE 77 07/01/2022 1538   GLUCOSE 73 03/21/2022 1203   BUN 10 07/01/2022 1538   CREATININE 1.25 (H) 07/01/2022 1538   CREATININE 1.03 03/13/2014 1305   CALCIUM 10.1 07/01/2022 1538   GFRNONAA 52 (L) 09/09/2021 0405   GFRAA >60 02/04/2016 2355   Lab Results  Component Value Date  HGBA1C 6.0 03/21/2022   HGBA1C 5.7 09/26/2017   No results found for: INSULIN  CBC    Component Value Date/Time   WBC 8.7 08/24/2022 1039   WBC 8.3 03/21/2022 1203   RBC 4.52 08/24/2022 1039   RBC 4.75 03/21/2022 1203   HGB 12.6 08/24/2022 1039   HCT 39.1 08/24/2022 1039   PLT 405 08/24/2022 1039   MCV 87 08/24/2022 1039   MCH 27.9 08/24/2022 1039   MCH 23.6 (L) 09/09/2021 0405   MCHC 32.2 08/24/2022 1039   MCHC 32.0 03/21/2022 1203   RDW 13.4 08/24/2022 1039   Iron/TIBC/Ferritin/ %Sat    Component Value Date/Time   IRON 132 03/21/2022 1203   TIBC 393 09/26/2017 1622   FERRITIN 12.1  03/21/2022 1203   IRONPCTSAT 4 (L) 09/26/2017 1622   Lipid Panel     Component Value Date/Time   CHOL 175 07/01/2022 1538   TRIG 175 (H) 07/01/2022 1538   HDL 40 07/01/2022 1538   CHOLHDL 4.4 07/01/2022 1538   CHOLHDL 4 01/27/2021 0827   VLDL 16.4 01/27/2021 0827   LDLCALC 104 (H) 07/01/2022 1538   Hepatic Function Panel     Component Value Date/Time   PROT 7.2 07/01/2022 1538   ALBUMIN  4.0 07/01/2022 1538   AST 27 07/01/2022 1538   ALT 22 07/01/2022 1538   ALKPHOS 131 (H) 07/01/2022 1538   BILITOT 0.7 07/01/2022 1538   BILIDIR 0.1 01/27/2021 0827   IBILI 0.4 11/25/2011 1653      Component Value Date/Time   TSH 2.13 03/21/2022 1203     Assessment and Plan   Vitamin D  deficiency  Diabetes insipidus (HCC)  Prediabetes  Hypothyroidism, unspecified type  OSA (obstructive sleep apnea)  S/P laparoscopic sleeve gastrectomy  Iron deficiency anemia due to chronic blood loss  Morbid obesity (HCC), STARTING BMI 45.9    Assessment and Plan          ESTABLISH WITH [facility]   Obesity Treatment / Action Plan:  Patient will work on garnering support from family and friends to begin weight loss journey. Will work on eliminating or reducing the presence of highly palatable, calorie dense foods in the home. Will complete provided nutritional and psychosocial assessment questionnaire before the next appointment. Will be scheduled for indirect calorimetry to determine resting energy expenditure in a fasting state.  This will allow us  to create a reduced calorie, high-protein meal plan to promote loss of fat mass while preserving muscle mass. Counseled on the health benefits of losing 5%-15% of total body weight. Was counseled on nutritional approaches to weight loss and benefits of reducing processed foods and consuming plant-based foods and high quality protein as part of nutritional weight management. Was counseled on pharmacotherapy and role as an adjunct in weight  management.   Obesity Education Performed Today:  She was weighed on the bioimpedance scale and results were discussed and documented in the synopsis.  We discussed obesity as a disease and the importance of a more detailed evaluation of all the factors contributing to the disease.  We discussed the importance of long term lifestyle changes which include nutrition, exercise and behavioral modifications as well as the importance of customizing this to her specific health and social needs.  We discussed the benefits of reaching a healthier weight to alleviate the symptoms of existing conditions and reduce the risks of the biomechanical, metabolic and psychological effects of obesity.  We reviewed the four pillars of obesity medicine and importance of using a multimodal approach.  We reviewed the basic principles in weight management.   Tiffany Roth appears to be in the action stage of change and states they are ready to start intensive lifestyle modifications and behavioral modifications.  I have spent 28 minutes in the care of the patient today including: 3 minutes before the visit reviewing and preparing the chart. 20 minutes face-to-face assessing and reviewing listed medical problems as outlined in obesity care plan, providing nutritional and behavioral counseling on topics outlined in the obesity care plan, counseling regarding anti-obesity medication as outlined in obesity care plan, independently interpreting test results and goals of care, as described in assessment and plan, and reviewing and discussing biometric information and progress 2 minutes after the visit updating chart and documentation of encounter.  Reviewed by clinician on day of visit: allergies, medications, problem list, medical history, surgical history, family history, social history, and previous encounter notes pertinent to obesity diagnosis.  Alaze Garverick d. Temesha Queener, NP-C

## 2023-10-09 ENCOUNTER — Encounter (INDEPENDENT_AMBULATORY_CARE_PROVIDER_SITE_OTHER): Payer: Self-pay

## 2023-10-18 ENCOUNTER — Encounter (INDEPENDENT_AMBULATORY_CARE_PROVIDER_SITE_OTHER): Payer: Self-pay | Admitting: Nurse Practitioner

## 2023-10-18 ENCOUNTER — Ambulatory Visit (INDEPENDENT_AMBULATORY_CARE_PROVIDER_SITE_OTHER): Admitting: Family Medicine

## 2023-10-18 ENCOUNTER — Ambulatory Visit (INDEPENDENT_AMBULATORY_CARE_PROVIDER_SITE_OTHER): Admitting: Nurse Practitioner

## 2023-10-18 VITALS — BP 116/79 | HR 70 | Temp 98.7°F | Ht 62.0 in | Wt 251.0 lb

## 2023-10-18 DIAGNOSIS — E039 Hypothyroidism, unspecified: Secondary | ICD-10-CM

## 2023-10-18 DIAGNOSIS — R0602 Shortness of breath: Secondary | ICD-10-CM

## 2023-10-18 DIAGNOSIS — G4733 Obstructive sleep apnea (adult) (pediatric): Secondary | ICD-10-CM

## 2023-10-18 DIAGNOSIS — Z9884 Bariatric surgery status: Secondary | ICD-10-CM

## 2023-10-18 DIAGNOSIS — E782 Mixed hyperlipidemia: Secondary | ICD-10-CM

## 2023-10-18 DIAGNOSIS — Z6841 Body Mass Index (BMI) 40.0 and over, adult: Secondary | ICD-10-CM

## 2023-10-18 DIAGNOSIS — Z1331 Encounter for screening for depression: Secondary | ICD-10-CM

## 2023-10-18 DIAGNOSIS — R7303 Prediabetes: Secondary | ICD-10-CM

## 2023-10-18 DIAGNOSIS — R5383 Other fatigue: Secondary | ICD-10-CM | POA: Diagnosis not present

## 2023-10-18 DIAGNOSIS — E559 Vitamin D deficiency, unspecified: Secondary | ICD-10-CM

## 2023-10-18 DIAGNOSIS — E232 Diabetes insipidus: Secondary | ICD-10-CM

## 2023-10-18 DIAGNOSIS — E66813 Obesity, class 3: Secondary | ICD-10-CM

## 2023-10-18 DIAGNOSIS — D500 Iron deficiency anemia secondary to blood loss (chronic): Secondary | ICD-10-CM

## 2023-10-18 DIAGNOSIS — N1831 Chronic kidney disease, stage 3a: Secondary | ICD-10-CM | POA: Diagnosis not present

## 2023-10-18 NOTE — Addendum Note (Signed)
 Addended by: CHESLEY NIELS CROME on: 10/18/2023 11:30 AM   Modules accepted: Orders

## 2023-10-18 NOTE — Progress Notes (Signed)
 [address] [place],  [place], [place] [address]  Office: [phone]  /  Fax: [phone]   Subjective   Initial Visit  Tiffany Roth (MR# [id number]) is a 33 y.o. female who presents for evaluation and treatment of obesity and related comorbidities. Current BMI is Body mass index is 45.91 kg/m. Tiffany Roth has been struggling with her weight for many years and has been unsuccessful in either losing weight, maintaining weight loss, or reaching her healthy weight goal.  Tiffany Roth is currently in the action stage of change and ready to dedicate time achieving and maintaining a healthier weight. Tiffany Roth is interested in becoming our patient and working on intensive lifestyle modifications including (but not limited to) diet and exercise for weight loss.  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 She does take desmopressin  0.1 mg as needed usually twice a week. Her nephrologist is retiring and will be transferring care to a new nephrologist.   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 Tanda. She still can only eat small portions. She has thought about revision but has not met with surgery. Has tried Ozempic  x 2 days and then developed full body rash so cannot take. She did not try Mounjaro  due to reaction from semaglutide .  She used Phentermine  for a short time several months ago with minimal response.  Sleep study 04/30/2012: mild obstructive sleep apnea/hypopnea with AHI of 11/hr and oxygen desaturation as low as 85%. She does have a CPAP and endorses 75% use.   She does have a history of hyperlipidemia which she is trying to treat with weight loss and limiting saturated fats in her diet.  Does admit she has not been monitoring her diet as well in the past several months.  She does have a history of iron deficiency anemia and does take ferrous sulfate  325 mg twice a day.  She has a previous history of vitamin D  deficiency but is not currently on any vitamin D   supplementation.  She has a history of Prediabetes with A1c between 5.7 and 6.1- tried Semaglutide  in past and developed full body rash. No other medication for prediabetes.   She was born at 28 weeks and weighed 1 pound. Stayed in the NICU for 3 months.   Weight history:  When asked how their weight has affected their life and health, she states: Having poor endurance and Problems with eating patterns  When asked what else they would like to accomplish? She states: Adopt a healthier eating pattern and lifestyle, Improve energy levels and physical activity, Improve existing medical conditions, Reduce number of medications, Improve quality of life, Improve appearance, Improve self-confidence, Lose 100 lbs, and increase muscle mass and reduce body fat  She starting to note weight gain during : teens.  Life events associated with weight gain include : breakups lead to weight gain.   Other contributing factors: family history of obesity, disruption of circadian rhythm / sleep disordered breathing, consumption of processed foods, moderate to high levels of stress, strong orexigenic signaling and/or inadequate inhibitory control , history of metabolic surgery, multiple weight loss attempts in the past, need for convenient foods, and self - critic or all-or-none mindset.  Their highest weight has been:  284 lbs.  Desired weight: 145 with time frame of a couple of years  Previous weight-loss programs : [facility], Weight Watchers.  Their maximum weight loss was:  37  lbs after gastric sleeve.  Their greatest challenge with dieting: no weight  loss, felt restrictive, difficulty maintaining reduced calorie state, and meal preparation and cooking.  Current or previous pharmacotherapy: GLP-1, Phentermine , and GLP-1 + GIP.  Response to medication: Had side effects with semaglutide  - had rash over entire body so it was discontinued and Phentermine  was only on a short period of time, no  weight loss   Nutritional History:  Current nutrition plan: None.  How many times do you eat outside the home: more than 14 a day  How often do they skip meals: eats 6 small mealsa day  What beverages do they drink: water , regular soda , juice, and sweet tea , red bull.  Will have 2 sweet drinks a day  Use of artificial sweetners : No  Food intolerances or dislikes: dairy- lactose intolerance  Food triggers: Stress, Boredom, When angry or upset, Seeking reward, Parties, Eating out, To help comfort self, and When Sad.She will wake up in middle of night to eat  Food cravings: Sugary, Salty, and Fast Food  Do they struggle with excessive hunger or portion control : No    Physical Activity:  Current level of physical activity: Walking 40 minutes, four  a week and Strength training 20 minutes, four a week  Barriers to Exercise: none   Past medical history includes:   Past Medical History:  Diagnosis Date   COVID-19    06/2021   Depression    Diabetes insipidus (HCC)    Eczema    Headache    headaches d/t DI   History of bulimia    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    Anthropometrics:  Vitals Temp: 98.7 F (37.1 C) BP: 116/79 Pulse Rate: 70 SpO2: 92 %   Anthropometric  Measurements Height: 5' 2 (1.575 m) Weight: 251 lb (113.9 kg) BMI (Calculated): 45.9 Weight at Last Visit: N/A Weight Lost Since Last Visit: N/A Weight Gained Since Last Visit: N/A Starting Weight: 251 lb Peak Weight: 284 lb Waist Measurement : 47 inches   Body Composition  Body Fat %: 48.3 % Fat Mass (lbs): 121.6 lbs Muscle Mass (lbs): 123.6 lbs Total Body Water  (lbs): 95.4 lbs Visceral Fat Rating : 14   Other Clinical Data Fasting: Yes Labs: Yes Today's Visit #: #1 Starting Date: 10/18/23 Comments: First Visit    Physical Exam:  General: She is overweight, cooperative, alert, well developed, and in no acute distress. PSYCH: Has normal mood, affect and thought process.   HEENT: EOMI, sclerae are anicteric. Lungs: Normal breathing effort, no conversational dyspnea. Extremities: No edema.  Neurologic: No gross sensory or motor deficits. No tremors or  fasciculations noted.    Diagnostic Data Reviewed  EKG: Normal sinus rhythm, rate 70. No conduction abnormalities, abnormal Q waves or chamber enlargement.  Indirect Calorimeter completed today shows a VO2 of 295 and a REE of 2030.  Her calculated basal metabolic rate is 8127 thus her resting energy expenditure faster than calculated.  Depression Screen  Tiffany Roth's PHQ-9 score was: 1.     10/18/2023   10:33 AM  Depression screen PHQ 2/9  Decreased Interest 1  Down, Depressed, Hopeless 0  PHQ - 2 Score 1    Screening for Sleep Related Breathing Disorders  Tiffany Roth admits to daytime somnolence and admits to waking up still tired. Patient has a history of symptoms of daytime fatigue, morning fatigue, Epworth sleepiness scale, and morning headache. Tiffany Roth generally gets 6 hours of sleep per night, and states that she has nightime awakenings. Snoring is present. Apneic episodes are present. Epworth Sleepiness Score is 14. She is currently pon CPAP but only using 75% of the time.  BMET    Component Value Date/Time    NA 147 (H) 07/01/2022 1538   K 4.2 07/01/2022 1538   CL 110 (H) 07/01/2022 1538   CO2 22 07/01/2022 1538   GLUCOSE 77 07/01/2022 1538   GLUCOSE 73 03/21/2022 1203   BUN 10 07/01/2022 1538   CREATININE 1.25 (H) 07/01/2022 1538   CREATININE 1.03 03/13/2014 1305   CALCIUM 10.1 07/01/2022 1538   GFRNONAA 52 (L) 09/09/2021 0405   GFRAA >60 02/04/2016 2355   Lab Results  Component Value Date   HGBA1C 6.0 03/21/2022   HGBA1C 5.7 09/26/2017   No results found for: INSULIN  CBC    Component Value Date/Time   WBC 8.7 08/24/2022 1039   WBC 8.3 03/21/2022 1203   RBC 4.52 08/24/2022 1039   RBC 4.75 03/21/2022 1203   HGB 12.6 08/24/2022 1039   HCT 39.1 08/24/2022 1039   PLT 405 08/24/2022 1039   MCV 87 08/24/2022 1039   MCH 27.9 08/24/2022 1039   MCH 23.6 (L) 09/09/2021 0405   MCHC 32.2 08/24/2022 1039   MCHC 32.0 03/21/2022 1203   RDW 13.4 08/24/2022 1039   Iron/TIBC/Ferritin/ %Sat    Component Value Date/Time   IRON 132 03/21/2022 1203   TIBC 393 09/26/2017 1622   FERRITIN 12.1 03/21/2022 1203   IRONPCTSAT 4 (L) 09/26/2017 1622   Lipid Panel     Component Value Date/Time   CHOL 175 07/01/2022 1538   TRIG 175 (H) 07/01/2022 1538   HDL 40 07/01/2022 1538   CHOLHDL 4.4 07/01/2022 1538   CHOLHDL 4 01/27/2021 0827   VLDL 16.4 01/27/2021 0827   LDLCALC 104 (H) 07/01/2022 1538   Hepatic Function Panel     Component Value Date/Time   PROT 7.2 07/01/2022 1538   ALBUMIN  4.0 07/01/2022 1538   AST 27 07/01/2022 1538   ALT 22 07/01/2022 1538   ALKPHOS 131 (H) 07/01/2022 1538   BILITOT 0.7 07/01/2022 1538   BILIDIR 0.1 01/27/2021 0827   IBILI 0.4 11/25/2011 1653      Component Value Date/Time   TSH 2.13 03/21/2022 1203     Assessment and Plan   TREATMENT PLAN FOR OBESITY:  Recommended Dietary Goals  Marlette is currently in the action stage of change. As such, her goal is to implement medically supervised obesity management plan.  She has agreed to implement:  the Category 3 plan - 1500 kcal per day  Behavioral Intervention  We discussed the following Behavioral Modification Strategies today:  increasing lean protein intake to established goals, decreasing simple carbohydrates , increasing vegetables, increasing lower glycemic fruits, increasing fiber rich foods, avoiding skipping meals, increasing water  intake, work on meal planning and preparation, reading food labels , keeping healthy foods at home, identifying sources and decreasing liquid calories, decreasing eating out or consumption of processed foods, and making healthy choices when eating convenient foods, planning for success, and better snacking choices  Additional resources provided today: Handout on healthy eating and balanced plate, Handout on complex carbohydrates and lean sources of protein, Personalized instruction on the use of artificial intelligence for recipes, tailored meal plans, calorie tracking, and finding healthier options when eating out. , Category 3 packet, and handout on eating out as she eats out approximately 14 times a week  Recommended Physical Activity Goals  Jannetta has been advised to work up to 150 minutes of moderate intensity aerobic activity a week and strengthening exercises 2-3 times per week for cardiovascular health, weight loss maintenance and preservation of muscle mass.   She has agreed to :  Continue current level of physical activity  and Increase physical activity in their day and reduce sedentary time (increase NEAT).  Medical Interventions and Pharmacotherapy We will work on building a Therapist, art and behavioral strategies. We will discuss the role of pharmacotherapy as an adjunct at subsequent visits.   ASSOCIATED CONDITIONS ADDRESSED TODAY  Other Fatigue  Shyvonne does feel that her weight is causing her energy to be lower than it should be. Fatigue may be related to obesity, depression or many other causes. Labs will be  ordered, and in the meanwhile, Tiffany Roth will focus on self care including making healthy food choices, increasing physical activity and focusing on stress reduction.  Shortness of Breath Tiffany Roth notes increasing shortness of breath with physical activity and seems to be worsening over time with weight gain. She notes getting out of breath sooner with activity than she used to. This has not gotten worse recently. Tiffany Roth denies shortness of breath at rest or orthopnea.  Other fatigue -     EKG 12-Lead  Diabetes insipidus (HCC) Continue to follow with nephrologist Continue Desmopressin  as prescribed  Prediabetes -     Comprehensive metabolic panel with GFR -     Hemoglobin A1c -     Insulin , random  Depression screening  OSA (obstructive sleep apnea) Continue CPAP and encouraged 100% use Discussed effects OSA has on physical and mental health.   Vitamin D  deficiency -     VITAMIN D  25 Hydroxy (Vit-D Deficiency, Fractures)  CKD stage 3a, GFR 45-59 ml/min (HCC) Continue to follow with nephrology Adjusted protein intake to 70 grams(0.6 mg/kg) due to decreased kidney function.    S/P laparoscopic sleeve gastrectomy -     Magnesium -     Vitamin B12 -     VITAMIN D  25 Hydroxy (Vit-D Deficiency, Fractures) -     Vitamin B1  Mixed hyperlipidemia Work on limiting saturated fats in diet Continue exercise -     Lipid panel  Iron deficiency anemia due to chronic blood loss -     CBC with Differential/Platelet  SOBOE (shortness of breath on exertion) -     EKG 12-Lead  Class 3 severe obesity with serious comorbidity and body mass index (BMI) of 45.0 to 49.9 in adult, unspecified obesity type -     CBC with Differential/Platelet -     Comprehensive metabolic panel with GFR -     Hemoglobin A1c -  Lipid panel -     Insulin , random -     Magnesium -     TSH -     Vitamin B12 -     VITAMIN D  25 Hydroxy (Vit-D Deficiency, Fractures) -     Vitamin B1    Follow-up  She  was informed of the importance of frequent follow-up visits to maximize her success with intensive lifestyle modifications for her multiple health conditions. She was informed we would discuss her lab results at her next visit unless there is a critical issue that needs to be addressed sooner. Tiffany Roth agreed to keep her next visit at the agreed upon time to discuss these results.  Attestation Statement  This is the patient's intake visit at [facility]. The patient's Health Questionnaire was reviewed at length. Included in the packet: current and past health history, medications, allergies, ROS, gynecologic history (women only), surgical history, family history, social history, weight history, weight loss surgery history (for those that have had weight loss surgery), nutritional evaluation, mood and food questionnaire, PHQ9, Epworth questionnaire, sleep habits questionnaire, patient life and health improvement goals questionnaire. These will all be scanned into the patient's chart under media.   During the visit, I independently reviewed the patient's EKG, previous labs, bioimpedance scale results, and indirect calorimetry results. I used this information to medically tailor a meal plan for the patient that will help her to lose weight and will improve her obesity-related conditions. I performed a medically necessary appropriate examination and/or evaluation. I discussed the assessment and treatment plan with the patient. The patient was provided an opportunity to ask questions and all were answered. The patient agreed with the plan and demonstrated an understanding of the instructions. Labs were ordered at this visit and will be reviewed at the next visit unless critical results need to be addressed immediately. Clinical information was updated and documented in the EMR.   In addition, they received basic education on identification of processed foods and reduction of these, different  sources of lean proteins and complex carbohydrates and how to eat balanced by incorporation of whole foods.  Reviewed by clinician on day of visit: allergies, medications, problem list, medical history, surgical history, family history, social history, and previous encounter notes.  I have spent 88 minutes in the care of the patient today including: 24 minutes before the visit reviewing and preparing the chart. 36 minutes face-to-face assessing and reviewing listed medical problems as outlined in obesity care plan, providing nutritional and behavioral counseling on topics outlined in the obesity care plan, independently interpreting test results and goals of care, as described in assessment and plan, reviewing and discussing biometric information and progress, reviewing latest PCP notes and specialist consultations, and ordering diagnostics - see orders 25 minutes after the visit updating chart and documentation of encounter.       Waymond Meador ANP-C

## 2023-10-19 ENCOUNTER — Ambulatory Visit (INDEPENDENT_AMBULATORY_CARE_PROVIDER_SITE_OTHER): Payer: Self-pay | Admitting: Nurse Practitioner

## 2023-10-26 LAB — CBC WITH DIFFERENTIAL/PLATELET
Basophils Absolute: 0.1 x10E3/uL (ref 0.0–0.2)
Basos: 1 %
EOS (ABSOLUTE): 0.1 x10E3/uL (ref 0.0–0.4)
Eos: 2 %
Hematocrit: 41.9 % (ref 34.0–46.6)
Hemoglobin: 13.3 g/dL (ref 11.1–15.9)
Immature Grans (Abs): 0 x10E3/uL (ref 0.0–0.1)
Immature Granulocytes: 0 %
Lymphocytes Absolute: 2.2 x10E3/uL (ref 0.7–3.1)
Lymphs: 37 %
MCH: 28.8 pg (ref 26.6–33.0)
MCHC: 31.7 g/dL (ref 31.5–35.7)
MCV: 91 fL (ref 79–97)
Monocytes Absolute: 0.6 x10E3/uL (ref 0.1–0.9)
Monocytes: 11 %
Neutrophils Absolute: 2.8 x10E3/uL (ref 1.4–7.0)
Neutrophils: 49 %
Platelets: 233 x10E3/uL (ref 150–450)
RBC: 4.62 x10E6/uL (ref 3.77–5.28)
RDW: 13.7 % (ref 11.7–15.4)
WBC: 5.8 x10E3/uL (ref 3.4–10.8)

## 2023-10-26 LAB — TSH: TSH: 2.8 u[IU]/mL (ref 0.450–4.500)

## 2023-10-26 LAB — COMPREHENSIVE METABOLIC PANEL WITH GFR
ALT: 14 IU/L (ref 0–32)
AST: 23 IU/L (ref 0–40)
Albumin: 4 g/dL (ref 3.9–4.9)
Alkaline Phosphatase: 135 IU/L — ABNORMAL HIGH (ref 44–121)
BUN/Creatinine Ratio: 6 — ABNORMAL LOW (ref 9–23)
BUN: 7 mg/dL (ref 6–20)
Bilirubin Total: 0.9 mg/dL (ref 0.0–1.2)
CO2: 20 mmol/L (ref 20–29)
Calcium: 9.5 mg/dL (ref 8.7–10.2)
Chloride: 106 mmol/L (ref 96–106)
Creatinine, Ser: 1.17 mg/dL — ABNORMAL HIGH (ref 0.57–1.00)
Globulin, Total: 3.3 g/dL (ref 1.5–4.5)
Glucose: 70 mg/dL (ref 70–99)
Potassium: 3.9 mmol/L (ref 3.5–5.2)
Sodium: 141 mmol/L (ref 134–144)
Total Protein: 7.3 g/dL (ref 6.0–8.5)
eGFR: 62 mL/min/1.73 (ref >59)

## 2023-10-26 LAB — LIPID PANEL
Chol/HDL Ratio: 5.2 ratio — ABNORMAL HIGH (ref 0.0–4.4)
Cholesterol, Total: 181 mg/dL (ref 100–199)
HDL: 35 mg/dL — ABNORMAL LOW (ref >39)
LDL Chol Calc (NIH): 122 mg/dL — ABNORMAL HIGH (ref 0–99)
Triglycerides: 131 mg/dL (ref 0–149)
VLDL Cholesterol Cal: 24 mg/dL (ref 5–40)

## 2023-10-26 LAB — VITAMIN D 25 HYDROXY (VIT D DEFICIENCY, FRACTURES): Vit D, 25-Hydroxy: 38.3 ng/mL (ref 30.0–100.0)

## 2023-10-26 LAB — MAGNESIUM: Magnesium: 1.7 mg/dL (ref 1.6–2.3)

## 2023-10-26 LAB — HEMOGLOBIN A1C
Est. average glucose Bld gHb Est-mCnc: 123 mg/dL
Hgb A1c MFr Bld: 5.9 % — ABNORMAL HIGH (ref 4.8–5.6)

## 2023-10-26 LAB — VITAMIN B12: Vitamin B-12: 860 pg/mL (ref 232–1245)

## 2023-10-26 LAB — INSULIN, RANDOM: INSULIN: 8.8 u[IU]/mL (ref 2.6–24.9)

## 2023-10-26 LAB — VITAMIN B1

## 2023-11-01 ENCOUNTER — Ambulatory Visit (INDEPENDENT_AMBULATORY_CARE_PROVIDER_SITE_OTHER): Admitting: Nurse Practitioner

## 2023-11-01 ENCOUNTER — Ambulatory Visit (INDEPENDENT_AMBULATORY_CARE_PROVIDER_SITE_OTHER): Admitting: Family Medicine

## 2023-11-02 ENCOUNTER — Encounter (INDEPENDENT_AMBULATORY_CARE_PROVIDER_SITE_OTHER): Payer: Self-pay | Admitting: Nurse Practitioner

## 2023-11-02 ENCOUNTER — Ambulatory Visit (INDEPENDENT_AMBULATORY_CARE_PROVIDER_SITE_OTHER): Admitting: Nurse Practitioner

## 2023-11-02 VITALS — BP 109/76 | HR 96 | Temp 98.3°F | Ht 62.0 in | Wt 246.0 lb

## 2023-11-02 DIAGNOSIS — R748 Abnormal levels of other serum enzymes: Secondary | ICD-10-CM | POA: Diagnosis not present

## 2023-11-02 DIAGNOSIS — G4733 Obstructive sleep apnea (adult) (pediatric): Secondary | ICD-10-CM

## 2023-11-02 DIAGNOSIS — Z6841 Body Mass Index (BMI) 40.0 and over, adult: Secondary | ICD-10-CM

## 2023-11-02 DIAGNOSIS — R7303 Prediabetes: Secondary | ICD-10-CM | POA: Diagnosis not present

## 2023-11-02 DIAGNOSIS — N1831 Chronic kidney disease, stage 3a: Secondary | ICD-10-CM

## 2023-11-02 DIAGNOSIS — Z9884 Bariatric surgery status: Secondary | ICD-10-CM

## 2023-11-02 DIAGNOSIS — E559 Vitamin D deficiency, unspecified: Secondary | ICD-10-CM

## 2023-11-02 DIAGNOSIS — E66813 Obesity, class 3: Secondary | ICD-10-CM

## 2023-11-02 DIAGNOSIS — E232 Diabetes insipidus: Secondary | ICD-10-CM | POA: Diagnosis not present

## 2023-11-02 DIAGNOSIS — E785 Hyperlipidemia, unspecified: Secondary | ICD-10-CM

## 2023-11-02 MED ORDER — VITAMIN D (ERGOCALCIFEROL) 1.25 MG (50000 UNIT) PO CAPS: 50000.0000 [IU] | ORAL_CAPSULE | ORAL | 1 refills | Status: DC

## 2023-11-02 NOTE — Progress Notes (Addendum)
 Office: [phone]  /  Fax: [phone]  WEIGHT SUMMARY AND BIOMETRICS  Weight Lost Since Last Visit: 5lb  Weight Gained Since Last Visit: 0lb   Vitals Temp: 98.3 F (36.8 C) BP: 109/76 Pulse Rate: 96 SpO2: 98 %   Anthropometric Measurements Height: 5' 2 (1.575 m) Weight: 246 lb (111.6 kg) BMI (Calculated): 44.98 Weight at Last Visit: 251lb Weight Lost Since Last Visit: 5lb Weight Gained Since Last Visit: 0lb Starting Weight: 251lb Total Weight Loss (lbs): 5 lb (2.268 kg) Peak Weight: 284lb Waist Measurement : 47 inches   Body Composition  Body Fat %: 47.8 % Fat Mass (lbs): 117.6 lbs Muscle Mass (lbs): 122 lbs Total Body Water  (lbs): 97.8 lbs Visceral Fat Rating : 14   Other Clinical Data RMR: 2030 Fasting: No Labs: no Today's Visit #: #2 Starting Date: 10/18/23     HPI  Chief Complaint: OBESITY  Tiffany Roth is here to discuss her progress with her obesity treatment plan. She is on the the Category 3 Plan and states she is following her eating plan approximately 80 % of the time. She states she is exercising 90 minutes 3 days per week- mostly cardio with strength training.  Reviewed bioimpedence data which showed loss of 4 pounds body fat and 1 pound of muscle.  Interval History:  Since last office visit she has been doing very well and has been following Category 3 meal plan.  Is working out with cardio and strength training for 90 minutes 3 days a week.  She is not skipping meals.  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 Tanda. She still can only eat small portions. She has thought about revision but has not met with surgery. Has tried Ozempic  x 2 days and then developed full body rash so cannot take. She did not try Mounjaro  due to reaction from semaglutide .  She used Phentermine  for a short time several months ago with minimal response.   Sleep study 04/30/2012: mild obstructive sleep apnea/hypopnea with AHI of 11/hr and oxygen  desaturation as low as 85%. She continues to use CPAP about 80% of the time     PHYSICAL EXAM:  Blood pressure 109/76, pulse 96, temperature 98.3 F (36.8 C), height 5' 2 (1.575 m), weight 246 lb (111.6 kg), SpO2 98%. Body mass index is 44.99 kg/m.  General: She is overweight, cooperative, alert, well developed, and in no acute distress. PSYCH: Has normal mood, affect and thought process.   Extremities: No edema.  Neurologic: No gross sensory or motor deficits. No tremors or fasciculations noted.    DIAGNOSTIC DATA REVIEWED: Last metabolic panel Lab Results  Component Value Date   GLUCOSE 70 10/18/2023   NA 141 10/18/2023   K 3.9 10/18/2023   CL 106 10/18/2023   CO2 20 10/18/2023   BUN 7 10/18/2023   CREATININE 1.17 (H) 10/18/2023   EGFR 62 10/18/2023   CALCIUM 9.5 10/18/2023   PHOS 4.3 06/06/2014   PROT 7.3 10/18/2023   ALBUMIN  4.0 10/18/2023   LABGLOB 3.3 10/18/2023   AGRATIO 1.3 07/01/2022   BILITOT 0.9 10/18/2023   ALKPHOS 135 (H) 10/18/2023   AST 23 10/18/2023   ALT 14 10/18/2023   ANIONGAP 13 09/09/2021    Lab Results  Component Value Date   HGBA1C 5.9 (H) 10/18/2023   HGBA1C 5.7 09/26/2017   Lab Results  Component Value Date   INSULIN  8.8 10/18/2023   Lab Results  Component Value Date   TSH 2.800 10/18/2023  CBC    Component Value Date/Time   WBC 5.8 10/18/2023 1213   WBC 8.3 03/21/2022 1203   RBC 4.62 10/18/2023 1213   RBC 4.75 03/21/2022 1203   HGB 13.3 10/18/2023 1213   HCT 41.9 10/18/2023 1213   PLT 233 10/18/2023 1213   MCV 91 10/18/2023 1213   MCH 28.8 10/18/2023 1213   MCH 23.6 (L) 09/09/2021 0405   MCHC 31.7 10/18/2023 1213   MCHC 32.0 03/21/2022 1203   RDW 13.7 10/18/2023 1213   Iron Studies    Component Value Date/Time   IRON 132 03/21/2022 1203   TIBC 393 09/26/2017 1622   FERRITIN 12.1 03/21/2022 1203   IRONPCTSAT 4 (L) 09/26/2017 1622   Lipid Panel     Component Value Date/Time   CHOL 181 10/18/2023 1213   TRIG  131 10/18/2023 1213   HDL 35 (L) 10/18/2023 1213   CHOLHDL 5.2 (H) 10/18/2023 1213   CHOLHDL 4 01/27/2021 0827   VLDL 16.4 01/27/2021 0827   LDLCALC 122 (H) 10/18/2023 1213      Component Value Date/Time   TSH 2.800 10/18/2023 1213   Nutritional Lab Results  Component Value Date   VD25OH 38.3 10/18/2023   VD25OH 36.14 03/21/2022   VD25OH 32.20 01/27/2021   Reviewed Labs: Dyslipidemia: HDL 35, LDL 122 Vit D deficiency: 61.6 Prediabetes: A1c 5.9 HOMA-IR-1.52 normal  ASSESSMENT AND PLAN  TREATMENT PLAN FOR OBESITY:  Recommended Dietary Goals  Tiffany Roth is currently in the action stage of change. As such, her goal is to continue weight management plan. She has agreed to the Category 3 Plan.  Behavioral Intervention  We discussed the following Behavioral Modification Strategies today: increasing lean protein intake to established goals, increasing vegetables, increasing water  intake , continue to work on maintaining a reduced calorie state, getting the recommended amount of protein, incorporating whole foods, making healthy choices, staying well hydrated and practicing mindfulness when eating., and increase protein intake, fibrous foods (25 grams per day for women, 30 grams for men) and water  to improve satiety and decrease hunger signals. .  Additional resources provided today: NA  Recommended Physical Activity Goals  Tiffany Roth has been advised to work up to 150 minutes of moderate intensity aerobic activity a week and strengthening exercises 2-3 times per week for cardiovascular health, weight loss maintenance and preservation of muscle mass.   She has agreed to Continue current level of physical activity , Increase physical activity in their day and reduce sedentary time (increase NEAT)., and Combine aerobic and strengthening exercises for efficiency and improved cardiometabolic health.   Pharmacotherapy We discussed various medication options to help Tiffany Roth with her weight  loss efforts and we both agreed to discuss possible use of Metformin at next visit.  Has elevated Alk Phos and will recheck CMP and GGT next visit.If remains elevated will plan to get liver U/S. If hepatic steatosis is discovered will plan to add Metformin  ASSOCIATED CONDITIONS ADDRESSED TODAY  Action/Plan  Diabetes insipidus (HCC) Prediabetes Continue Category 3 meal plan Limit simple carbs  Elevated alkaline phosphatase level Will recheck CMP and GGT next visit.If remains elevated will plan to get liver U/S. If hepatic steatosis is discovered will plan to add Metformin  OSA on CPAP Continue CPAP use and try to use 100% of the time  Vitamin D  deficiency Start ergocalciferol  50000 units once a week and recheck Vit D in 3 months -     Vitamin D  (Ergocalciferol ); Take 1 capsule (50,000 Units total) by mouth every  7 (seven) days.  Dispense: 5 capsule; Refill: 1  CKD stage 3a, GFR 45-59 ml/min (HCC) Last GFR showed improvement to 62 Continue to push fluids and keep Protein intake at 60 grams a day Continue to follow with nephrology  S/P laparoscopic sleeve gastrectomy Some portion size restrictions remain but did not lose much weight after gastrectomy Will continue focusing on behavioral weight management at this time.   Dyslipidemia (high LDL; low HDL) Counseled on limiting saturated fat in diet and instructed on reading labels. Counseled losing 10-15% body weight can improve lipids Continue Category 3 meal plan and exercise, focus on weight loss  Class 3 severe obesity with serious comorbidity and body mass index (BMI) of 45.0 to 49.9 in adult, unspecified obesity type Counseled losing 10-15% body weight can improve medical conditions Continue Category 3 meal plan and exercise, focus on weight loss Continue cardio and strength training 90 minutes 3 times a week     Return in about 2 weeks (around 11/16/2023).SABRA She was informed of the importance of frequent follow up visits to  maximize her success with intensive lifestyle modifications for her multiple health conditions.   ATTESTASTION STATEMENTS:  Reviewed by clinician on day of visit: allergies, medications, problem list, medical history, surgical history, family history, social history, and previous encounter notes.     Tiffany Roth ANP-C

## 2023-11-20 ENCOUNTER — Ambulatory Visit (INDEPENDENT_AMBULATORY_CARE_PROVIDER_SITE_OTHER): Admitting: Nurse Practitioner

## 2023-11-22 ENCOUNTER — Ambulatory Visit: Admitting: Dermatology

## 2023-11-29 ENCOUNTER — Ambulatory Visit (INDEPENDENT_AMBULATORY_CARE_PROVIDER_SITE_OTHER): Admitting: Nurse Practitioner

## 2023-11-29 ENCOUNTER — Ambulatory Visit (INDEPENDENT_AMBULATORY_CARE_PROVIDER_SITE_OTHER): Admitting: Family Medicine

## 2023-12-25 ENCOUNTER — Ambulatory Visit (INDEPENDENT_AMBULATORY_CARE_PROVIDER_SITE_OTHER): Admitting: Nurse Practitioner

## 2023-12-25 ENCOUNTER — Encounter (INDEPENDENT_AMBULATORY_CARE_PROVIDER_SITE_OTHER): Payer: Self-pay | Admitting: Nurse Practitioner

## 2023-12-25 VITALS — BP 107/73 | HR 85 | Temp 98.8°F | Ht 62.0 in | Wt 242.0 lb

## 2023-12-25 DIAGNOSIS — Z6841 Body Mass Index (BMI) 40.0 and over, adult: Secondary | ICD-10-CM

## 2023-12-25 DIAGNOSIS — E559 Vitamin D deficiency, unspecified: Secondary | ICD-10-CM

## 2023-12-25 DIAGNOSIS — E66813 Obesity, class 3: Secondary | ICD-10-CM

## 2023-12-25 DIAGNOSIS — G4733 Obstructive sleep apnea (adult) (pediatric): Secondary | ICD-10-CM

## 2023-12-25 DIAGNOSIS — R7303 Prediabetes: Secondary | ICD-10-CM | POA: Diagnosis not present

## 2023-12-25 DIAGNOSIS — R748 Abnormal levels of other serum enzymes: Secondary | ICD-10-CM

## 2023-12-25 DIAGNOSIS — Z9884 Bariatric surgery status: Secondary | ICD-10-CM

## 2023-12-25 MED ORDER — VITAMIN D (ERGOCALCIFEROL) 1.25 MG (50000 UNIT) PO CAPS: 50000.0000 [IU] | ORAL_CAPSULE | ORAL | 1 refills | Status: DC

## 2023-12-25 NOTE — Progress Notes (Signed)
 Office: [phone]  /  Fax: [phone]  WEIGHT SUMMARY AND BIOMETRICS  Weight Lost Since Last Visit: 4 lb  Weight Gained Since Last Visit: 0   Vitals Temp: 98.8 F (37.1 C) BP: 107/73 Pulse Rate: 85 SpO2: 99 %   Anthropometric Measurements Height: 5' 2 (1.575 m) Weight: 242 lb (109.8 kg) BMI (Calculated): 44.25 Weight at Last Visit: 246 lb Weight Lost Since Last Visit: 4 lb Weight Gained Since Last Visit: 0 Starting Weight: 251 lb Total Weight Loss (lbs): 9 lb (4.082 kg) Peak Weight: 284 lb   Body Composition  Body Fat %: 47 % Fat Mass (lbs): 114 lbs Muscle Mass (lbs): 122 lbs Total Body Water  (lbs): 98.2 lbs Visceral Fat Rating : 13   Other Clinical Data Fasting: NO Labs: NO Today's Visit #: #3 Starting Date: 10/18/23    Total Weight Loss: 9 pounds  Percent of body weight lost: 1.5%  Bio Impedance Data reviewed with patient: Muscle mass the same, adipose down 3.6 pounds. PBF 47.8% to 47%, Visceral fat rating from 14 to 13.  HPI  Chief Complaint: OBESITY  Tiffany Roth is here to discuss her progress with her obesity treatment plan. She is on the the Category 3 Plan and states she is following her eating plan approximately 70 % of the time. She states she is exercising 60 minutes 4 days per week doing weight lifting and cardio at [employer].   Interval History:  Since last office visit she has been getting 50 grams of protein daily, goal is 70/grams. Getting at least 2 gallons of water  daily with diabetes insipidus.  She has been under a lot of stress with her partner that she is trying to separate from., she is making it difficult. She is still occasionally skipping meals. She had tried Ozempic  , took 1 shot and got whole body rash- no trouble breathing or swallowing. Denies family history of MEN2 and MTC.  She had an elevated alkaline phosphatase so will recheck GGT and CMP today.   She has severe sleep apnea and is wearing her CPAP 4/7 nights.    She was started on ergocalciferol  50000 units once a week at last visit for low normal Vit D level Last vitamin D  Lab Results  Component Value Date   VD25OH 38.3 10/18/2023      PHYSICAL EXAM:  Blood pressure 107/73, pulse 85, temperature 98.8 F (37.1 C), height 5' 2 (1.575 m), weight 242 lb (109.8 kg), SpO2 99%. Body mass index is 44.26 kg/m.  General: Well Developed, well nourished, and in no acute distress.  HEENT: Normocephalic, atraumatic; EOMI, sclerae are anicteric. Skin: Warm and dry, good turgor Chest:  Normal excursion, shape, no gross ABN Respiratory: No conversational dyspnea; speaking in full sentences NeuroM-Sk:  Normal gross ROM * 4 extremities  Psych: A and O X 3, insight adequate, mood- full    DIAGNOSTIC DATA REVIEWED:  Last metabolic panel Lab Results  Component Value Date   GLUCOSE 70 10/18/2023   NA 141 10/18/2023   K 3.9 10/18/2023   CL 106 10/18/2023   CO2 20 10/18/2023   BUN 7 10/18/2023   CREATININE 1.17 (H) 10/18/2023   EGFR 62 10/18/2023   CALCIUM 9.5 10/18/2023   PHOS 4.3 06/06/2014   PROT 7.3 10/18/2023   ALBUMIN  4.0 10/18/2023   LABGLOB 3.3 10/18/2023   AGRATIO 1.3 07/01/2022   BILITOT 0.9 10/18/2023   ALKPHOS 135 (H) 10/18/2023   AST 23 10/18/2023   ALT 14 10/18/2023  ANIONGAP 13 09/09/2021     Lab Results  Component Value Date   HGBA1C 5.9 (H) 10/18/2023   HGBA1C 5.7 09/26/2017   Lab Results  Component Value Date   INSULIN  8.8 10/18/2023   Lab Results  Component Value Date   TSH 2.800 10/18/2023   CBC    Component Value Date/Time   WBC 5.8 10/18/2023 1213   WBC 8.3 03/21/2022 1203   RBC 4.62 10/18/2023 1213   RBC 4.75 03/21/2022 1203   HGB 13.3 10/18/2023 1213   HCT 41.9 10/18/2023 1213   PLT 233 10/18/2023 1213   MCV 91 10/18/2023 1213   MCH 28.8 10/18/2023 1213   MCH 23.6 (L) 09/09/2021 0405   MCHC 31.7 10/18/2023 1213   MCHC 32.0 03/21/2022 1203   RDW 13.7 10/18/2023 1213   Iron Studies     Component Value Date/Time   IRON 132 03/21/2022 1203   TIBC 393 09/26/2017 1622   FERRITIN 12.1 03/21/2022 1203   IRONPCTSAT 4 (L) 09/26/2017 1622   Lipid Panel     Component Value Date/Time   CHOL 181 10/18/2023 1213   TRIG 131 10/18/2023 1213   HDL 35 (L) 10/18/2023 1213   CHOLHDL 5.2 (H) 10/18/2023 1213   CHOLHDL 4 01/27/2021 0827   VLDL 16.4 01/27/2021 0827   LDLCALC 122 (H) 10/18/2023 1213   Hepatic Function Panel     Component Value Date/Time   PROT 7.3 10/18/2023 1213   ALBUMIN  4.0 10/18/2023 1213   AST 23 10/18/2023 1213   ALT 14 10/18/2023 1213   ALKPHOS 135 (H) 10/18/2023 1213   BILITOT 0.9 10/18/2023 1213   BILIDIR 0.1 01/27/2021 0827   IBILI 0.4 11/25/2011 1653      Component Value Date/Time   TSH 2.800 10/18/2023 1213   Nutritional Lab Results  Component Value Date   VD25OH 38.3 10/18/2023   VD25OH 36.14 03/21/2022   VD25OH 32.20 01/27/2021     ASSESSMENT AND PLAN  Class 3 severe obesity with serious comorbidity and body mass index (BMI) of 45.0 to 49.9 in adult, unspecified obesity type (HCC) TREATMENT PLAN FOR OBESITY:  Recommended Dietary Goals  Tiffany Roth is currently in the action stage of change. As such, her goal is to continue weight management plan. She has agreed to the Category 3 Plan.  Behavioral Intervention  We discussed the following Behavioral Modification Strategies today: increasing lean protein intake to established goals, avoiding skipping meals, work on managing stress, creating time for self-care and relaxation, and continue to work on maintaining a reduced calorie state, getting the recommended amount of protein, incorporating whole foods, making healthy choices, staying well hydrated and practicing mindfulness when eating..  Additional resources provided today: NA  Recommended Physical Activity Goals  Tiffany Roth has been advised to work up to 150 minutes of moderate intensity aerobic activity a week and strengthening  exercises 2-3 times per week for cardiovascular health, weight loss maintenance and preservation of muscle mass.   She has agreed to Continue current level of physical activity - 60 minutes of cardio and strength training 4 days a week. Think about enjoyable ways to increase daily physical activity and overcoming barriers to exercise, and Increase physical activity in their day and reduce sedentary time (increase NEAT).   Pharmacotherapy We discussed various medication options to help Tiffany Roth with her weight loss efforts and we both agreed to continue nutrition and exercise. She is interested in possible use of Zepbound but had all over body rash after first dose of Ozempic -  will discuss with Dr. Francyne  ASSOCIATED CONDITIONS ADDRESSED TODAY  Action/Plan  Elevated alkaline phosphatase level Will recheck liver enzymes and GGT if positive will get liver U/S -     Comprehensive metabolic panel with GFR -     Gamma GT  Vitamin D  deficiency        Continue to supplement with Ergocalciferol  50000 units once a week , denies side effects        -     Vitamin D , Ergocalciferol , (DRISDOL ) 1.25 MG (50000 UNIT) CAPS capsule; Take 1 capsule (50,000 Units total) by    mouth every 7 (seven) days.  Prediabetes Continue Category 3  meal plan, limit simple carbohydrates Decreasing body weight by 10-15% can improve glucose levels Continue exercise with current goal of 240 minutes of moderate to high intensity exercise/week.   S/P laparoscopic sleeve gastrectomy       Continue to decrease portion sizes as needed and add extra meal if necessary to get to calorie goal  OSA       Stressed the importance of using CPAP 100% of the time as sleep study showed severe sleep apnea        Return in about 3 weeks (around 01/15/2024).Tiffany Roth She was informed of the importance of frequent follow up visits to maximize her success with intensive lifestyle modifications for her multiple health  conditions.   ATTESTASTION STATEMENTS:  Reviewed by clinician on day of visit: allergies, medications, problem list, medical history, surgical history, family history, social history, and previous encounter notes.     Tiffany Roth ANP-C

## 2023-12-26 ENCOUNTER — Ambulatory Visit (INDEPENDENT_AMBULATORY_CARE_PROVIDER_SITE_OTHER): Payer: Self-pay | Admitting: Nurse Practitioner

## 2023-12-26 DIAGNOSIS — R748 Abnormal levels of other serum enzymes: Secondary | ICD-10-CM

## 2023-12-26 LAB — COMPREHENSIVE METABOLIC PANEL WITH GFR
ALT: 20 IU/L (ref 0–32)
AST: 28 IU/L (ref 0–40)
Albumin: 4.1 g/dL (ref 3.9–4.9)
Alkaline Phosphatase: 142 IU/L — ABNORMAL HIGH (ref 41–116)
BUN/Creatinine Ratio: 8 — ABNORMAL LOW (ref 9–23)
BUN: 11 mg/dL (ref 6–20)
Bilirubin Total: 0.4 mg/dL (ref 0.0–1.2)
CO2: 23 mmol/L (ref 20–29)
Calcium: 9.6 mg/dL (ref 8.7–10.2)
Chloride: 106 mmol/L (ref 96–106)
Creatinine, Ser: 1.31 mg/dL — ABNORMAL HIGH (ref 0.57–1.00)
Globulin, Total: 3 g/dL (ref 1.5–4.5)
Glucose: 88 mg/dL (ref 70–99)
Potassium: 4.4 mmol/L (ref 3.5–5.2)
Sodium: 144 mmol/L (ref 134–144)
Total Protein: 7.1 g/dL (ref 6.0–8.5)
eGFR: 54 mL/min/1.73 — ABNORMAL LOW (ref >59)

## 2023-12-26 LAB — GAMMA GT: GGT: 8 IU/L (ref 0–60)

## 2024-01-08 ENCOUNTER — Other Ambulatory Visit (INDEPENDENT_AMBULATORY_CARE_PROVIDER_SITE_OTHER): Payer: Self-pay | Admitting: Nurse Practitioner

## 2024-01-08 ENCOUNTER — Ambulatory Visit (HOSPITAL_COMMUNITY)
Admission: RE | Admit: 2024-01-08 | Discharge: 2024-01-08 | Disposition: A | Discharge status: Home / Self Care | Source: Ambulatory Visit | Attending: Nurse Practitioner | Admitting: Nurse Practitioner

## 2024-01-08 ENCOUNTER — Ambulatory Visit (INDEPENDENT_AMBULATORY_CARE_PROVIDER_SITE_OTHER): Payer: Self-pay | Admitting: Nurse Practitioner

## 2024-01-08 DIAGNOSIS — R748 Abnormal levels of other serum enzymes: Secondary | ICD-10-CM | POA: Insufficient documentation

## 2024-01-08 DIAGNOSIS — R932 Abnormal findings on diagnostic imaging of liver and biliary tract: Secondary | ICD-10-CM

## 2024-01-10 NOTE — Telephone Encounter (Signed)
**Note De-identified  [place] Obfuscation** Please advise 

## 2024-01-16 ENCOUNTER — Ambulatory Visit (INDEPENDENT_AMBULATORY_CARE_PROVIDER_SITE_OTHER): Payer: Self-pay | Admitting: Nurse Practitioner

## 2024-01-16 ENCOUNTER — Telehealth (INDEPENDENT_AMBULATORY_CARE_PROVIDER_SITE_OTHER): Payer: Self-pay | Admitting: Nurse Practitioner

## 2024-01-16 NOTE — Telephone Encounter (Signed)
 Good morning!  She requested a referral be sent in to an allergist!  Thanks.

## 2024-01-17 ENCOUNTER — Other Ambulatory Visit (INDEPENDENT_AMBULATORY_CARE_PROVIDER_SITE_OTHER): Payer: Self-pay | Admitting: Nurse Practitioner

## 2024-01-17 DIAGNOSIS — Z889 Allergy status to unspecified drugs, medicaments and biological substances status: Secondary | ICD-10-CM

## 2024-01-17 NOTE — Telephone Encounter (Signed)
 Please advise I have placed the referral to allergy

## 2024-01-25 ENCOUNTER — Ambulatory Visit: Admitting: Allergy

## 2024-02-08 ENCOUNTER — Encounter (INDEPENDENT_AMBULATORY_CARE_PROVIDER_SITE_OTHER): Payer: Self-pay | Admitting: Nurse Practitioner

## 2024-02-08 ENCOUNTER — Ambulatory Visit (INDEPENDENT_AMBULATORY_CARE_PROVIDER_SITE_OTHER): Admitting: Nurse Practitioner

## 2024-02-08 VITALS — BP 114/79 | HR 92 | Temp 98.5°F | Ht 62.0 in | Wt 239.0 lb

## 2024-02-08 DIAGNOSIS — E66813 Obesity, class 3: Secondary | ICD-10-CM

## 2024-02-08 DIAGNOSIS — F5089 Other specified eating disorder: Secondary | ICD-10-CM

## 2024-02-08 DIAGNOSIS — Z6841 Body Mass Index (BMI) 40.0 and over, adult: Secondary | ICD-10-CM

## 2024-02-08 DIAGNOSIS — R7303 Prediabetes: Secondary | ICD-10-CM

## 2024-02-08 DIAGNOSIS — F3289 Other specified depressive episodes: Secondary | ICD-10-CM

## 2024-02-08 DIAGNOSIS — G4733 Obstructive sleep apnea (adult) (pediatric): Secondary | ICD-10-CM

## 2024-02-08 DIAGNOSIS — E559 Vitamin D deficiency, unspecified: Secondary | ICD-10-CM

## 2024-02-08 DIAGNOSIS — Z9884 Bariatric surgery status: Secondary | ICD-10-CM

## 2024-02-08 MED ORDER — BUPROPION HCL ER (XL) 150 MG PO TB24: 150.0000 mg | ORAL_TABLET | Freq: Every morning | ORAL | 0 refills | Status: AC

## 2024-02-08 NOTE — Progress Notes (Signed)
 Office: [phone]  /  Fax: [phone]  WEIGHT SUMMARY AND BIOMETRICS  Weight Lost Since Last Visit: 3 lb  Weight Gained Since Last Visit: 0   Vitals Temp: 98.5 F (36.9 C) BP: 114/79 Pulse Rate: 92 SpO2: 98 %   Anthropometric Measurements Height: 5' 2 (1.575 m) Weight: 239 lb (108.4 kg) BMI (Calculated): 43.7 Weight at Last Visit: 242 lb Weight Lost Since Last Visit: 3 lb Weight Gained Since Last Visit: 0 Starting Weight: 251 lb Total Weight Loss (lbs): 12 lb (5.443 kg) Peak Weight: 284 lb   Body Composition  Body Fat %: 47 % Fat Mass (lbs): 112.6 lbs Muscle Mass (lbs): 120.6 lbs Total Body Water  (lbs): 97.4 lbs Visceral Fat Rating : 13   Other Clinical Data Fasting: no Labs: no Today's Visit #: 4 Starting Date: 10/18/23    Total Weight Loss: 12 pounds  Percent of body weight lost:4.8% Bio Impedance Data reviewed with patient: Muscle is down 1.4 pounds, adipose is down 1.4 pounds.   HPI  Chief Complaint: OBESITY  Tiffany Roth is here to discuss her progress with her obesity treatment plan. She is on the the Category 3 Plan and states she is following her eating plan approximately 85 % of the time. She states she is exercising 90 minutes 5 days per week.   Interval History:  Since last office visit she has been getting more distance from her previous partner. She is working a second job as a engineer, technical sales, schedule varies.   She has been drinking about 1/2 gallon of water -this is a marked decrease from normal due to diabetes insipidus.  She is getting about 60 grams of protein daily, not enough protein daily. She should 80-90 grams of protein daily, has CKD stage 3a.  Thanksgiving went well, stayed away from sweets She is exercising 5 days a week for 90 minutes doing weight, stairmaster and treadmill  She is going to see the allergist on 02/15/24 to discuss ability to use Mounjaro  since she had allergic reaction with Wegovy. She plans to cancel and continue  weight loss journey on her own.   She has been encouraged to wear her CPAP more often. Still not 100% She continues of ergocalciferol  50000 units once a week  for Vit D deficiency and denies side effects.  She continues on Wellbutrin and does believe it helps with her mood and emotional eating behaviors.  PHYSICAL EXAM:  Blood pressure 114/79, pulse 92, temperature 98.5 F (36.9 C), height 5' 2 (1.575 m), weight 239 lb (108.4 kg), last menstrual period 01/22/2024, SpO2 98%. Body mass index is 43.71 kg/m.  General: Well Developed, well nourished, and in no acute distress.  HEENT: Normocephalic, atraumatic; EOMI, sclerae are anicteric. Skin: Warm and dry, good turgor Chest:  Normal excursion, shape, no gross ABN Respiratory: No conversational dyspnea; speaking in full sentences NeuroM-Sk:  Normal gross ROM * 4 extremities  Psych: A and O X 3, insight adequate, mood- full    DIAGNOSTIC DATA REVIEWED:  BMET    Component Value Date/Time   NA 144 12/25/2023 1602   K 4.4 12/25/2023 1602   CL 106 12/25/2023 1602   CO2 23 12/25/2023 1602   GLUCOSE 88 12/25/2023 1602   GLUCOSE 73 03/21/2022 1203   BUN 11 12/25/2023 1602   CREATININE 1.31 (H) 12/25/2023 1602   CREATININE 1.03 03/13/2014 1305   CALCIUM 9.6 12/25/2023 1602   GFRNONAA 52 (L) 09/09/2021 0405   GFRAA >60 02/04/2016 2355   Lab Results  Component  Value Date   HGBA1C 5.9 (H) 10/18/2023   HGBA1C 5.7 09/26/2017   Lab Results  Component Value Date   INSULIN  8.8 10/18/2023   Lab Results  Component Value Date   TSH 2.800 10/18/2023   CBC    Component Value Date/Time   WBC 5.8 10/18/2023 1213   WBC 8.3 03/21/2022 1203   RBC 4.62 10/18/2023 1213   RBC 4.75 03/21/2022 1203   HGB 13.3 10/18/2023 1213   HCT 41.9 10/18/2023 1213   PLT 233 10/18/2023 1213   MCV 91 10/18/2023 1213   MCH 28.8 10/18/2023 1213   MCH 23.6 (L) 09/09/2021 0405   MCHC 31.7 10/18/2023 1213   MCHC 32.0 03/21/2022 1203   RDW 13.7 10/18/2023  1213   Iron Studies    Component Value Date/Time   IRON 132 03/21/2022 1203   TIBC 393 09/26/2017 1622   FERRITIN 12.1 03/21/2022 1203   IRONPCTSAT 4 (L) 09/26/2017 1622   Lipid Panel     Component Value Date/Time   CHOL 181 10/18/2023 1213   TRIG 131 10/18/2023 1213   HDL 35 (L) 10/18/2023 1213   CHOLHDL 5.2 (H) 10/18/2023 1213   CHOLHDL 4 01/27/2021 0827   VLDL 16.4 01/27/2021 0827   LDLCALC 122 (H) 10/18/2023 1213   Hepatic Function Panel     Component Value Date/Time   PROT 7.1 12/25/2023 1602   ALBUMIN  4.1 12/25/2023 1602   AST 28 12/25/2023 1602   ALT 20 12/25/2023 1602   ALKPHOS 142 (H) 12/25/2023 1602   BILITOT 0.4 12/25/2023 1602   BILIDIR 0.1 01/27/2021 0827   IBILI 0.4 11/25/2011 1653      Component Value Date/Time   TSH 2.800 10/18/2023 1213   Nutritional Lab Results  Component Value Date   VD25OH 38.3 10/18/2023   VD25OH 36.14 03/21/2022   VD25OH 32.20 01/27/2021     ASSESSMENT AND PLAN  Class 3 severe obesity with serious comorbidity and body mass index (BMI) of 40.0 to 44.9 in adult, unspecified obesity type (HCC) TREATMENT PLAN FOR OBESITY:  Recommended Dietary Goals  Latausha is currently in the action stage of change. As such, her goal is to continue weight management plan. She has agreed to the Category 3 Plan.  Behavioral Intervention  We discussed the following Behavioral Modification Strategies today: increasing lean protein intake to established goals which is 80-90 grams due to CKD stage 3 a, increasing vegetables, increasing fiber rich foods, avoiding skipping meals, increasing water  intake , continue to work on implementation of reduced calorie nutritional plan, continue to practice mindfulness when eating, and celebration eating strategies.  Celebration eating strategies She plans to maintain her weight in December - She wants to have small indulgences, but she is not going to waste her calories on things that are not  wonderful. - Outside of social situations she will continue to follow a structured eating plan but enjoy herself with portion control for holiday events, parties and get-togethers - She does recognize that this strategy is to help her maintain her weight, not lose weight and in January she will return to a structured plan for weight loss    Recommended Physical Activity Goals  Keasha has been advised to work up to 150 minutes of moderate intensity aerobic activity a week and strengthening exercises 2-3 times per week for cardiovascular health, weight loss maintenance and preservation of muscle mass.   She has agreed to Continue current level of physical activity  and Combine aerobic and strengthening exercises for  efficiency and improved cardiometabolic health.   Pharmacotherapy We discussed various medication options to help Melonie with her weight loss efforts and we both agreed to continue Wellbutrin 150 mg every day for emotional eating behaviors.  ASSOCIATED CONDITIONS ADDRESSED TODAY  Action/Plan  S/P laparoscopic sleeve gastrectomy        Continue to follow category 3 meal plan, smaller portions and more frequent meals as needed        Continue to follow regularly with surgeon as needed  Prediabetes Continue Category 3  meal plan, limit simple carbohydrates, increase lean protein/fiber/water  Continue to follow regularly with PCP Continue exercise of 90 minutes 5 days a week of cardio and strength training  OSA on CPAP     Encouraged 100% use of CPAP     Loss of 10-15% body weight can help improve OSA   Vitamin D  deficiency       Continue to supplement with Ergocalciferol  50000 units once a week       Low vitamin D  levels can be associated with adiposity and may result in leptin resistance and weight gain. Also associated with fatigue.        Currently on vitamin D  supplementation without any adverse effects such as nausea, vomiting or muscle weakness.    Emotional eating  behaviors Continue to practice mindfulness when eating Continue Wellbutrin 150 mg every day  -     buPROPion HCl ER (XL); Take 1 tablet (150 mg total) by mouth every morning.  Dispense: 90 tablet; Refill: 0         No follow-ups on file.SABRA She was informed of the importance of frequent follow up visits to maximize her success with intensive lifestyle modifications for her multiple health conditions.   ATTESTASTION STATEMENTS:  Reviewed by clinician on day of visit: allergies, medications, problem list, medical history, surgical history, family history, social history, and previous encounter notes.     Audrick Lamoureaux ANP-C

## 2024-02-15 ENCOUNTER — Ambulatory Visit: Admitting: Allergy

## 2024-03-14 ENCOUNTER — Ambulatory Visit (INDEPENDENT_AMBULATORY_CARE_PROVIDER_SITE_OTHER): Admitting: Nurse Practitioner

## 2024-03-16 ENCOUNTER — Other Ambulatory Visit (INDEPENDENT_AMBULATORY_CARE_PROVIDER_SITE_OTHER): Payer: Self-pay | Admitting: Nurse Practitioner

## 2024-03-16 DIAGNOSIS — E559 Vitamin D deficiency, unspecified: Secondary | ICD-10-CM

## 2024-03-26 ENCOUNTER — Encounter (INDEPENDENT_AMBULATORY_CARE_PROVIDER_SITE_OTHER): Payer: Self-pay | Admitting: Nurse Practitioner

## 2024-03-26 ENCOUNTER — Ambulatory Visit (INDEPENDENT_AMBULATORY_CARE_PROVIDER_SITE_OTHER): Admitting: Nurse Practitioner

## 2024-03-26 VITALS — BP 120/62 | HR 94 | Temp 98.2°F | Ht 62.0 in | Wt 231.0 lb

## 2024-03-26 DIAGNOSIS — E66813 Obesity, class 3: Secondary | ICD-10-CM

## 2024-03-26 DIAGNOSIS — G4733 Obstructive sleep apnea (adult) (pediatric): Secondary | ICD-10-CM | POA: Diagnosis not present

## 2024-03-26 DIAGNOSIS — R7303 Prediabetes: Secondary | ICD-10-CM

## 2024-03-26 DIAGNOSIS — E559 Vitamin D deficiency, unspecified: Secondary | ICD-10-CM | POA: Diagnosis not present

## 2024-03-26 DIAGNOSIS — Z6841 Body Mass Index (BMI) 40.0 and over, adult: Secondary | ICD-10-CM | POA: Diagnosis not present

## 2024-03-26 DIAGNOSIS — Z9884 Bariatric surgery status: Secondary | ICD-10-CM

## 2024-03-26 DIAGNOSIS — F5089 Other specified eating disorder: Secondary | ICD-10-CM

## 2024-03-26 DIAGNOSIS — F3289 Other specified depressive episodes: Secondary | ICD-10-CM

## 2024-03-26 MED ORDER — VITAMIN D (ERGOCALCIFEROL) 1.25 MG (50000 UNIT) PO CAPS: 50000.0000 [IU] | ORAL_CAPSULE | ORAL | 1 refills | Status: AC

## 2024-03-26 NOTE — Progress Notes (Signed)
 " Office: [phone]  /  Fax: [phone]  WEIGHT SUMMARY AND BIOMETRICS  Weight Lost Since Last Visit: 8 lb  Weight Gained Since Last Visit: 0   Vitals Temp: 98.2 F (36.8 C) BP: 120/62 Pulse Rate: 94 SpO2: 95 %   Anthropometric Measurements Height: 5' 2 (1.575 m) Weight: 231 lb (104.8 kg) BMI (Calculated): 42.24 Weight at Last Visit: 239 lb Weight Lost Since Last Visit: 8 lb Weight Gained Since Last Visit: 0 Starting Weight: 251 lb Total Weight Loss (lbs): 20 lb (9.072 kg) Peak Weight: 284 lb   Body Composition  Body Fat %: 44.4 % Fat Mass (lbs): 102.6 lbs Muscle Mass (lbs): 122 lbs Total Body Water  (lbs): 90 lbs Visceral Fat Rating : 12   Other Clinical Data Fasting: no Labs: no Today's Visit #: 5 Starting Date: 10/18/23    Total Weight Loss: 20 pounds Percent of body weight lost: 7.9%  Bio Impedance Data reviewed with patient: Muscle is up 1.4 pounds, adipose is down 10 pounds. Visceral fat rating decreased 1 point from 13 to 12.   HPI  Chief Complaint: OBESITY  Tiffany Roth is here to discuss her progress with her obesity treatment plan. She is on the the Category 3 Plan and states she is following her eating plan approximately 70 % of the time. She states she is exercising 90 minutes 5 days per week and does cardio and weights- [employer].   Interval History:  Since last office visit she has been trying to get her protein around 100 grams daily.  She has occasionally skipped breakfast. Water  is around 80 ounces a day. Less fruits and vegetables- plans to increase. 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    Leiloni continues on Wellbutrin  XL 150 mg every day for emotional eating behaviors and does find it helpful  She does have Vit D deficiency and has been prescribed Ergocalciferol  50000 units to take once a week. Denies side effects with the  medication Last vitamin D  Lab Results  Component Value Date   VD25OH 38.3 10/18/2023   She does have prediabetes and is working on nutrition, exercise and weight loss to improve glucose levels.   PHYSICAL EXAM:  Blood pressure 120/62, pulse 94, temperature 98.2 F (36.8 C), height 5' 2 (1.575 m), weight 231 lb (104.8 kg), last menstrual period 03/17/2024, SpO2 95%. Body mass index is 42.25 kg/m.  General: Well Developed, well nourished, and in no acute distress.  HEENT: Normocephalic, atraumatic; EOMI, sclerae are anicteric. Skin: Warm and dry, good turgor Chest:  Normal excursion, shape, no gross ABN Respiratory: No conversational dyspnea; speaking in full sentences NeuroM-Sk:  Normal gross ROM * 4 extremities  Psych: A and O X 3, insight adequate, mood- full    DIAGNOSTIC DATA REVIEWED:  BMET    Component Value Date/Time   NA 144 12/25/2023 1602   K 4.4 12/25/2023 1602   CL 106 12/25/2023 1602   CO2 23 12/25/2023 1602   GLUCOSE 88 12/25/2023 1602   GLUCOSE 73 03/21/2022 1203   BUN 11 12/25/2023 1602   CREATININE 1.31 (H) 12/25/2023 1602   CREATININE 1.03 03/13/2014 1305   CALCIUM 9.6 12/25/2023 1602   GFRNONAA 52 (L) 09/09/2021 0405   GFRAA >60 02/04/2016 2355   Lab Results  Component Value Date   HGBA1C 5.9 (H) 10/18/2023   HGBA1C 5.7 09/26/2017   Lab Results  Component Value Date   INSULIN  8.8 10/18/2023   Lab Results  Component Value Date   TSH 2.800 10/18/2023   CBC    Component Value Date/Time   WBC 5.8 10/18/2023 1213   WBC 8.3 03/21/2022 1203   RBC 4.62 10/18/2023 1213   RBC 4.75 03/21/2022 1203   HGB 13.3 10/18/2023 1213   HCT 41.9 10/18/2023 1213   PLT 233 10/18/2023 1213   MCV 91 10/18/2023 1213   MCH 28.8 10/18/2023 1213   MCH 23.6 (L) 09/09/2021 0405   MCHC 31.7 10/18/2023 1213   MCHC 32.0 03/21/2022 1203   RDW 13.7 10/18/2023 1213   Iron Studies    Component Value Date/Time   IRON 132 03/21/2022 1203   TIBC 393 09/26/2017  1622   FERRITIN 12.1 03/21/2022 1203   IRONPCTSAT 4 (L) 09/26/2017 1622   Lipid Panel     Component Value Date/Time   CHOL 181 10/18/2023 1213   TRIG 131 10/18/2023 1213   HDL 35 (L) 10/18/2023 1213   CHOLHDL 5.2 (H) 10/18/2023 1213   CHOLHDL 4 01/27/2021 0827   VLDL 16.4 01/27/2021 0827   LDLCALC 122 (H) 10/18/2023 1213   Hepatic Function Panel     Component Value Date/Time   PROT 7.1 12/25/2023 1602   ALBUMIN  4.1 12/25/2023 1602   AST 28 12/25/2023 1602   ALT 20 12/25/2023 1602   ALKPHOS 142 (H) 12/25/2023 1602   BILITOT 0.4 12/25/2023 1602   BILIDIR 0.1 01/27/2021 0827   IBILI 0.4 11/25/2011 1653      Component Value Date/Time   TSH 2.800 10/18/2023 1213   Nutritional Lab Results  Component Value Date   VD25OH 38.3 10/18/2023   VD25OH 36.14 03/21/2022   VD25OH 32.20 01/27/2021     ASSESSMENT AND PLAN Class 3 severe obesity with serious comorbidity and body mass index (BMI) of 40.0 to 44.9 in adult, unspecified obesity type (HCC) S/P laparoscopic sleeve gastrectomy TREATMENT PLAN FOR OBESITY:  Recommended Dietary Goals  Emylia is currently in the action stage of change. As such, her goal is to continue weight management plan. She has agreed to the Category 3 Plan.  Behavioral Intervention  We discussed the following Behavioral Modification Strategies today: continue to work on maintaining a reduced calorie state, getting the recommended amount of protein, incorporating whole foods, making healthy choices, staying well hydrated and practicing mindfulness when eating. and increase protein intake, fibrous foods (25 grams per day for women, 30 grams for men) and water  to improve satiety and decrease hunger signals. .   Recommended Physical Activity Goals  Shantana has been advised to work up to 150 minutes of moderate intensity aerobic activity a week and strengthening exercises 2-3 times per week for cardiovascular health, weight loss maintenance and  preservation of muscle mass.   She has agreed to Continue current level of physical activity  and Combine aerobic and strengthening exercises for efficiency and improved cardiometabolic health.   Pharmacotherapy We discussed various medication options to help Lizzette with her weight loss efforts and we both agreed to continue Wellbutrin  XL 150 mg every day for emotional eating behaviors- denies side effects. She continues on Ergocalciferol  50000 units once a week for Vit D deficiency and denies side effects.   ASSOCIATED CONDITIONS ADDRESSED TODAY  Action/Plan  Vitamin D  deficiency       Supplement with Ergocalciferol  50000 units once a week        Low vitamin D  levels can be associated with adiposity and may  result in leptin resistance and weight gain. Also associated with fatigue.        Currently on vitamin D  supplementation without any adverse effects such as nausea, vomiting or muscle weakness.   -     Vitamin D , Ergocalciferol , (DRISDOL ) 1.25 MG (50000 UNIT) CAPS capsule; Take 1 capsule (50,000 Units total) by mouth every 7 (seven) days.  OSA on CPAP       Use CPAP 100% of the time       Decreasing body weight by 10-15% can improve AHI   Prediabetes Continue Category 3  meal plan, limit simple carbohydrates. Increase lean  protein, fiber and water  Continue exercise with current goal of 240 minutes of moderate to high intensity exercise/week with 2-3 days of strength training.  Emotional eating behaviors       Continue to work on recognizing the differences between hunger and cravings       Continue Wellbutrin  XL 150 mg every day- continue to monitor for side effects        Return in about 4 weeks (around 04/23/2024).SABRA She was informed of the importance of frequent follow up visits to maximize her success with intensive lifestyle modifications for her multiple health conditions.   ATTESTASTION STATEMENTS:  Reviewed by clinician on day of visit: allergies, medications,  problem list, medical history, surgical history, family history, social history, and previous encounter notes.     Lonell Liverpool ANP-C "

## 2024-04-03 NOTE — Progress Notes (Signed)
 This encounter was created in error - please disregard.

## 2024-04-17 ENCOUNTER — Ambulatory Visit: Admitting: Dermatology

## 2024-04-24 ENCOUNTER — Ambulatory Visit (INDEPENDENT_AMBULATORY_CARE_PROVIDER_SITE_OTHER): Admitting: Nurse Practitioner
